# Patient Record
Sex: Female | Born: 1950
Health system: Southern US, Community
[De-identification: ages and names within clinical notes are randomized; demographics above are authoritative.]

## PROBLEM LIST (undated history)

## (undated) DIAGNOSIS — G2581 Restless legs syndrome: Secondary | ICD-10-CM

## (undated) DIAGNOSIS — M810 Age-related osteoporosis without current pathological fracture: Secondary | ICD-10-CM

## (undated) DIAGNOSIS — F419 Anxiety disorder, unspecified: Secondary | ICD-10-CM

## (undated) DIAGNOSIS — E785 Hyperlipidemia, unspecified: Secondary | ICD-10-CM

## (undated) DIAGNOSIS — M1990 Unspecified osteoarthritis, unspecified site: Secondary | ICD-10-CM

## (undated) DIAGNOSIS — J449 Chronic obstructive pulmonary disease, unspecified: Secondary | ICD-10-CM

## (undated) DIAGNOSIS — Z87442 Personal history of urinary calculi: Secondary | ICD-10-CM

## (undated) DIAGNOSIS — Z8709 Personal history of other diseases of the respiratory system: Secondary | ICD-10-CM

## (undated) DIAGNOSIS — Z720 Tobacco use: Secondary | ICD-10-CM

## (undated) DIAGNOSIS — T1490XA Injury, unspecified, initial encounter: Secondary | ICD-10-CM

## (undated) HISTORY — DX: Anxiety disorder, unspecified: F41.9

## (undated) HISTORY — PX: OTHER SURGICAL HISTORY: SHX169

## (undated) HISTORY — PX: COLONOSCOPY: SHX174

## (undated) HISTORY — DX: Age-related osteoporosis without current pathological fracture: M81.0

## (undated) HISTORY — PX: DILATION AND CURETTAGE OF UTERUS: SHX78

## (undated) HISTORY — PX: POLYPECTOMY: SHX149

## (undated) HISTORY — DX: Hyperlipidemia, unspecified: E78.5

---

## 2001-03-13 ENCOUNTER — Encounter: Admission: RE | Admit: 2001-03-13 | Discharge: 2001-03-13 | Payer: Self-pay | Admitting: Orthopedic Surgery

## 2001-03-13 ENCOUNTER — Encounter: Payer: Self-pay | Admitting: Orthopedic Surgery

## 2002-06-04 ENCOUNTER — Other Ambulatory Visit: Admission: RE | Admit: 2002-06-04 | Discharge: 2002-06-04 | Payer: Self-pay | Admitting: Obstetrics and Gynecology

## 2003-06-05 ENCOUNTER — Other Ambulatory Visit: Admission: RE | Admit: 2003-06-05 | Discharge: 2003-06-05 | Payer: Self-pay | Admitting: Obstetrics and Gynecology

## 2003-11-18 ENCOUNTER — Encounter: Payer: Self-pay | Admitting: Internal Medicine

## 2003-12-01 ENCOUNTER — Ambulatory Visit (HOSPITAL_COMMUNITY): Admission: RE | Admit: 2003-12-01 | Discharge: 2003-12-01 | Payer: Self-pay | Admitting: Orthopedic Surgery

## 2004-07-13 ENCOUNTER — Other Ambulatory Visit: Admission: RE | Admit: 2004-07-13 | Discharge: 2004-07-13 | Payer: Self-pay | Admitting: Obstetrics and Gynecology

## 2005-07-25 ENCOUNTER — Other Ambulatory Visit: Admission: RE | Admit: 2005-07-25 | Discharge: 2005-07-25 | Payer: Self-pay | Admitting: Obstetrics and Gynecology

## 2008-02-22 DIAGNOSIS — M810 Age-related osteoporosis without current pathological fracture: Secondary | ICD-10-CM

## 2008-02-22 HISTORY — DX: Age-related osteoporosis without current pathological fracture: M81.0

## 2009-10-07 ENCOUNTER — Encounter: Payer: Self-pay | Admitting: Internal Medicine

## 2009-12-29 ENCOUNTER — Encounter: Payer: Self-pay | Admitting: Internal Medicine

## 2009-12-30 ENCOUNTER — Encounter: Admission: RE | Admit: 2009-12-30 | Discharge: 2009-12-30 | Payer: Self-pay | Admitting: Family Medicine

## 2009-12-30 ENCOUNTER — Encounter (INDEPENDENT_AMBULATORY_CARE_PROVIDER_SITE_OTHER): Payer: Self-pay | Admitting: *Deleted

## 2009-12-31 ENCOUNTER — Encounter (INDEPENDENT_AMBULATORY_CARE_PROVIDER_SITE_OTHER): Payer: Self-pay | Admitting: *Deleted

## 2010-01-29 DIAGNOSIS — Z860100 Personal history of colon polyps, unspecified: Secondary | ICD-10-CM | POA: Insufficient documentation

## 2010-01-29 DIAGNOSIS — N951 Menopausal and female climacteric states: Secondary | ICD-10-CM | POA: Insufficient documentation

## 2010-01-29 DIAGNOSIS — F411 Generalized anxiety disorder: Secondary | ICD-10-CM | POA: Insufficient documentation

## 2010-02-01 ENCOUNTER — Ambulatory Visit: Payer: Self-pay | Admitting: Internal Medicine

## 2010-02-01 DIAGNOSIS — E785 Hyperlipidemia, unspecified: Secondary | ICD-10-CM | POA: Insufficient documentation

## 2010-02-01 DIAGNOSIS — R142 Eructation: Secondary | ICD-10-CM

## 2010-02-01 DIAGNOSIS — R141 Gas pain: Secondary | ICD-10-CM | POA: Insufficient documentation

## 2010-02-01 DIAGNOSIS — M129 Arthropathy, unspecified: Secondary | ICD-10-CM | POA: Insufficient documentation

## 2010-02-01 DIAGNOSIS — F329 Major depressive disorder, single episode, unspecified: Secondary | ICD-10-CM | POA: Insufficient documentation

## 2010-02-01 DIAGNOSIS — R109 Unspecified abdominal pain: Secondary | ICD-10-CM | POA: Insufficient documentation

## 2010-02-01 DIAGNOSIS — F32A Depression, unspecified: Secondary | ICD-10-CM | POA: Insufficient documentation

## 2010-02-01 DIAGNOSIS — R143 Flatulence: Secondary | ICD-10-CM

## 2010-02-02 LAB — CONVERTED CEMR LAB
Basophils Relative: 0.3 % (ref 0.0–3.0)
Eosinophils Absolute: 0 10*3/uL (ref 0.0–0.7)
Lymphocytes Relative: 50.4 % — ABNORMAL HIGH (ref 12.0–46.0)
MCHC: 33.7 g/dL (ref 30.0–36.0)
Neutrophils Relative %: 40.8 % — ABNORMAL LOW (ref 43.0–77.0)
Platelets: 260 10*3/uL (ref 150.0–400.0)
RBC: 4.23 M/uL (ref 3.87–5.11)
Sed Rate: 10 mm/hr (ref 0–22)
TSH: 2.26 microintl units/mL (ref 0.35–5.50)
WBC: 11 10*3/uL — ABNORMAL HIGH (ref 4.5–10.5)

## 2010-02-05 ENCOUNTER — Ambulatory Visit: Payer: Self-pay | Admitting: Internal Medicine

## 2010-02-06 ENCOUNTER — Telehealth: Payer: Self-pay | Admitting: Gastroenterology

## 2010-02-06 ENCOUNTER — Emergency Department (HOSPITAL_COMMUNITY)
Admission: EM | Admit: 2010-02-06 | Discharge: 2010-02-06 | Payer: Self-pay | Source: Home / Self Care | Admitting: Emergency Medicine

## 2010-02-08 ENCOUNTER — Ambulatory Visit (HOSPITAL_COMMUNITY)
Admission: RE | Admit: 2010-02-08 | Discharge: 2010-02-08 | Payer: Self-pay | Source: Home / Self Care | Attending: Urology | Admitting: Urology

## 2010-02-08 ENCOUNTER — Encounter: Payer: Self-pay | Admitting: Internal Medicine

## 2010-02-09 ENCOUNTER — Telehealth: Payer: Self-pay | Admitting: Internal Medicine

## 2010-03-25 NOTE — Procedures (Signed)
Summary: COLON   Colonoscopy  Procedure date:  11/18/2003  Findings:      Location:  [facility].    Procedures Next Due Date:    Colonoscopy: 11/2008 Patient Name: Erica Richardson, Boullion MRN:  Procedure Procedures: Colonoscopy CPT: 60454.    with polypectomy. CPT: A3573898.  Personnel: Endoscopist: Dora L. Juanda Chance, MD.  Referred By: Huel Cote, MD.  Exam Location: Exam performed in Outpatient Clinic. Outpatient  Patient Consent: Procedure, Alternatives, Risks and Benefits discussed, consent obtained, from patient. Consent was obtained by the RN.  Indications  Average Risk Screening Routine.  History  Current Medications: Patient is not currently taking Coumadin.  Pre-Exam Physical: Performed Nov 18, 2003. Entire physical exam was normal.  Exam Exam: Extent of exam reached: Cecum, extent intended: Cecum.  The cecum was identified by appendiceal orifice and IC valve. Colon retroflexion performed. Images taken. ASA Classification: I. Tolerance: good.  Monitoring: Pulse and BP monitoring, Oximetry used. Supplemental O2 given.  Colon Prep Used Miralax for colon prep. Prep results: good.  Sedation Meds: Patient assessed and found to be appropriate for moderate (conscious) sedation. Fentanyl 100 mcg. given IV. Versed 10 mg. given IV.  Findings NORMAL EXAM: Cecum.  - POLYP: Rectum, Maximum size: 6 mm. pedunculated polyp. Distance from Anus 2 cm. Procedure:  snare with cautery, removed, retrieved, Polyp sent to pathology. ICD9: Colon Polyps: 211.3.   Assessment Abnormal examination, see findings above.  Diagnoses: 211.3: Colon Polyps.   Comments: s/p snare polypectomy Events  Unplanned Interventions: No intervention was required.  Unplanned Events: There were no complications. Plans  Post Exam Instructions: No aspirin or non-steroidal containing medications: 2 weeks.  Medication Plan: Await pathology.  Patient Education: Patient given  standard instructions for: Yearly hemoccult testing recommended. Patient instructed to get routine colonoscopy every 3-5 years.  Comments: recall colon. in 3-5 years depending on whether the polyp is hyperplastic or adenomatous Disposition: After procedure patient sent to recovery. After recovery patient sent home.   This report was created from the original endoscopy report, which was reviewed and signed by the above listed endoscopist.    This report was created from the original endoscopy report, which was reviewed and signed by the above listed endoscopist.

## 2010-03-25 NOTE — Procedures (Signed)
Summary: Colonoscopy  Patient: Erica Richardson Note: All result statuses are Final unless otherwise noted.  Tests: (1) Colonoscopy (COL)   COL Colonoscopy           DONE     [facility]     [address]     [place], [place]  [address]           COLONOSCOPY PROCEDURE REPORT           PATIENT:  Erica Richardson, Erica Richardson  MR#:  [id number]     BIRTHDATE:  [birth year], 59 yrs. old  GENDER:  female     ENDOSCOPIST:  Hedwig Morton. Juanda Chance, MD     REF. BY:  Catha Gosselin, M.D.     PROCEDURE DATE:  02/05/2010     PROCEDURE:  Colonoscopy 69629     ASA CLASS:  Class II     INDICATIONS:  Abdominal pain lower abd. pain, constant, relieved     by laying down,     CT scan abd. negative     last colon 2005 hypeplastic polyp     MEDICATIONS:   Versed 10 mg, Fentanyl 100 mcg           DESCRIPTION OF PROCEDURE:   After the risks benefits and     alternatives of the procedure were thoroughly explained, informed     consent was obtained.  Digital rectal exam was performed and     revealed no rectal masses.   The LB 180AL [id number] endoscope was     introduced through the anus and advanced to the cecum, which was     identified by both the appendix and ileocecal valve, without     limitations.  The quality of the prep was good, using MiraLax.     The instrument was then slowly withdrawn as the colon was fully     examined.     <<PROCEDUREIMAGES>>           FINDINGS:  No polyps or cancers were seen (see image1, image2,     image3, image4, image5, and image6).   Retroflexed views in the     rectum revealed no abnormalities.    The scope was then withdrawn     from the patient and the procedure completed.           COMPLICATIONS:  None     ENDOSCOPIC IMPRESSION:     1) No polyps or cancers     2) Normal colonoscopy     nothing to account for lower abd. pain,, colon is not tortuos,     there was no difficulty in passing through,     pt had a hard time relaxing     RECOMMENDATIONS:     refer to Urology  center for evaluation of a pelvic floor     dysfunction and urinary frequency     Trial of Librax 1 po bid, #60, 1 refill as an antispasmodic     REPEAT EXAM:  In 10 year(s) for.           ______________________________     Hedwig Morton. Juanda Chance, MD           CC:  Huel Cote, MD           n.     Rosalie DoctorHedwig Morton. Brodie at 02/05/2010 11:43 AM           Lorene Dy, [phone]  Note: An exclamation mark (!) indicates a result that was  not dispersed into the flowsheet. Document Creation Date: 02/05/2010 11:44 AM _______________________________________________________________________  (1) Order result status: Final Collection or observation date-time: 02/05/2010 11:33 Requested date-time:  Receipt date-time:  Reported date-time:  Referring Physician:   Ordering Physician: Lina Sar [phone]) Specimen Source:  Source: Launa Grill Order Number: [id number] Lab site:   Appended Document: Colonoscopy    Clinical Lists Changes  Observations: Added new observation of COLONNXTDUE: 02/06/2020 (02/05/2010 13:36)

## 2010-03-25 NOTE — Letter (Signed)
Summary: [facility] Office Note  [facility] Office Note   Imported By: Lamona Curl CMA (AAMA) 02/09/2010 10:46:17  _____________________________________________________________________  External Attachment:    Type:   Image     Comment:   External Document

## 2010-03-25 NOTE — Progress Notes (Signed)
Summary: Urology Evaluation  ---- Converted from flag ----  ---- 02/08/2010 9:27 AM, Lamona Curl CMA (AAMA) wrote: ok...so do I need to set up appointment with urology now that they saw her in consult? or do you just want me to send our notes over? ---- 02/07/2010 9:53 AM, Hart Carwin MD wrote: Did I tell You to set up a urology appointment at the Urology center for lower abd. pain, possible pelvic floor dysfunction, urinary frequency, r/o interstitial cystitis? She ended up in ER this weekend with a kidney stone and Dr Christella Hartigan had a Urology to see her for that so she may have a urologist by now but he may not know the other stuff about her. ------------------------------ ---- 02/08/2010 9:27 AM, Lamona Curl CMA (AAMA) wrote: ok...so do I need to set up appointment with urology now that they saw her in consult? or do you just want me to send our notes over?  ---- 02/08/2010 9:44 PM, Hart Carwin MD wrote: I think she needs a separate referral to Urology clinic for pelvic pain and urinary frequency.   I have called [facility]. She actually saw them in the office yesterday at which time she did complain to them of suprapubic pain and urgency. I will send note to Dr Juanda Chance. Dottie Nelson-Smith CMA Duncan Dull)  February 09, 2010 10:42 AM    Appended Document: Urology Evaluation I did fax our office notes to Dr Annabell Howells for his review.

## 2010-03-25 NOTE — Letter (Signed)
Summary: [facility]   Imported By: Sherian Rein 02/08/2010 10:13:28  _____________________________________________________________________  External Attachment:    Type:   Image     Comment:   External Document

## 2010-03-25 NOTE — Letter (Signed)
Summary: Pt's Medical & Medication Hx/[facility]  Pt's Medical & Medication Hx/[facility]   Imported By: Sherian Rein 02/08/2010 10:14:50  _____________________________________________________________________  External Attachment:    Type:   Image     Comment:   External Document

## 2010-03-25 NOTE — Miscellaneous (Signed)
Summary: librax rx  Clinical Lists Changes  Medications: Added new medication of LIBRAX 2.5-5 MG  CAPS (CLIDINIUM-CHLORDIAZEPOXIDE) take one by mouth twice daily - Signed Rx of LIBRAX 2.5-5 MG  CAPS (CLIDINIUM-CHLORDIAZEPOXIDE) take one by mouth twice daily;  #60 x 1;  Signed;  Entered by: Oda Cogan RN;  Authorized by: Hart Carwin MD;  Method used: Electronically to [facility] [phone]*, [address] [place], [place]  [address], Ph: [phone] or [phone], Fax: [phone]    Prescriptions: LIBRAX 2.5-5 MG  CAPS (CLIDINIUM-CHLORDIAZEPOXIDE) take one by mouth twice daily  #60 x 1   Entered by:   Oda Cogan RN   Authorized by:   Hart Carwin MD   Signed by:   Oda Cogan RN on 02/05/2010   Method used:   [facility]ronically to        [facility] [phone][facility] (retail)       [address]       [place], [place]  [address]       Ph: [phone] or [phone]       Fax: [phone]   RxID:   [id number]

## 2010-03-25 NOTE — Letter (Signed)
Summary: New Patient letter  [facility]  [address] [place], [place] [address]   Phone: [phone]  Fax: [phone]       12/31/2009 MRN: [id number]  [facility] [address] [place], [place]  [address]  Dear Erica Richardson,  Welcome to the Gastroenterology Division at [facility].    You are scheduled to see Dr.  Juanda Chance on 03-08-09 at 10:30a.m. on the 3rd floor at [facility], [address]  We ask that you try to arrive at our office 15 minutes prior to your appointment time to allow for check-in.  We would like you to complete the enclosed self-administered evaluation form prior to your visit and bring it with you on the day of your appointment.  We will review it with you.  Also, please bring a complete list of all your medications or, if you prefer, bring the medication bottles and we will list them.  Please bring your insurance card so that we may make a copy of it.  If your insurance requires a referral to see a specialist, please bring your referral form from your primary care physician.  Co-payments are due at the time of your visit and may be paid by cash, check or credit card.     Your office visit will consist of a consult with your physician (includes a physical exam), any laboratory testing he/she may order, scheduling of any necessary diagnostic testing (e.g. x-ray, ultrasound, CT-scan), and scheduling of a procedure (e.g. Endoscopy, Colonoscopy) if required.  Please allow enough time on your schedule to allow for any/all of these possibilities.    If you cannot keep your appointment, please call [phone] to cancel or reschedule prior to your appointment date.  This allows [name] the opportunity to schedule an appointment for another patient in need of care.  If you do not cancel or reschedule by 5 p.m. the business day prior to your appointment date, you will be charged a $50.00 late cancellation/no-show fee.    Thank you for choosing  [facility] for your medical needs.  We appreciate the opportunity to care for you.  Please visit [name] at our website  to learn more about our practice.                     Sincerely,                                                             The Gastroenterology Division

## 2010-03-25 NOTE — Letter (Signed)
Summary: [facility] Instructions  [facility]  [address] [place], [place] [address]   Phone: [phone]  Fax: [phone]       Erica Richardson    [birth year]    MRN: [id number]       Procedure Day /Date: Tuesday 03/16/10     Arrival Time: 7:30 am     Procedure Time: 8:00 am     Location of Procedure:                    _x _  [facility] (4th Floor)  PREPARATION FOR COLONOSCOPY WITH MIRALAX  Starting 5 days prior to your procedure 03/11/10 do not eat nuts, seeds, popcorn, corn, beans, peas,  salads, or any raw vegetables.  Do not take any fiber supplements (e.g. Metamucil, Citrucel, and Benefiber). ____________________________________________________________________________________________________   THE DAY BEFORE YOUR PROCEDURE         DATE: 03/15/10 DAY: Monday  1   Drink clear liquids the entire day-NO SOLID FOOD  2   Do not drink anything colored red or purple.  Avoid juices with pulp.  No orange juice.  3   Drink at least 64 oz. (8 glasses) of fluid/clear liquids during the day to prevent dehydration and help the prep work efficiently.  CLEAR LIQUIDS INCLUDE: Water Jello Ice Popsicles Tea (sugar ok, no milk/cream) Powdered fruit flavored drinks Coffee (sugar ok, no milk/cream) Gatorade Juice: apple, white grape, white cranberry  Lemonade Clear bullion, consomm, broth Carbonated beverages (any kind) Strained chicken noodle soup Hard Candy  4   Mix the entire bottle of Miralax with 64 oz. of Gatorade/Powerade in the morning and put in the refrigerator to chill.  5   At 3:00 pm take 2 Dulcolax/Bisacodyl tablets.  6   At 4:30 pm take one Reglan/Metoclopramide tablet.  7  Starting at 5:00 pm drink one 8 oz glass of the Miralax mixture every 15-20 minutes until you have finished drinking the entire 64 oz.  You should finish drinking prep around 7:30 or 8:00 pm.  8   If you are nauseated, you may take the 2nd Reglan/Metoclopramide tablet  at 6:30 pm.        9    At 8:00 pm take 2 more DULCOLAX/Bisacodyl tablets.         THE DAY OF YOUR PROCEDURE      DATE:  03/16/10 DAY: Tuesday  You may drink clear liquids until 6:00 am  (2 HOURS BEFORE PROCEDURE).   MEDICATION INSTRUCTIONS  Unless otherwise instructed, you should take regular prescription medications with a small sip of water as early as possible the morning of your procedure.        OTHER INSTRUCTIONS  You will need a responsible adult at least 58 years of age to accompany you and drive you home.   This person must remain in the waiting room during your procedure.  Wear loose fitting clothing that is easily removed.  Leave jewelry and other valuables at home.  However, you may wish to bring a book to read or an iPod/MP3 player to listen to music as you wait for your procedure to start.  Remove all body piercing jewelry and leave at home.  Total time from sign-in until discharge is approximately 2-3 hours.  You should go home directly after your procedure and rest.  You can resume normal activities the day after your procedure.  The day of your procedure you should not:   Drive  Make legal decisions   Operate machinery   Drink alcohol   Return to work  You will receive specific instructions about eating, activities and medications before you leave.   The above instructions have been reviewed and explained to me by   Lamona Curl CMA Duncan Dull)  February 01, 2010 8:53 AM    I fully understand and can verbalize these instructions _____________________________ Date _______

## 2010-03-25 NOTE — Progress Notes (Signed)
  Phone Note Call from Patient   Summary of Call: she called early this am with acute llq pain.  I asked her to go to ER.  Evaluation there showed left sided  hydroureter to the level of a 7mm stone.  ER triaged her to urologic care. Initial call taken by: Rachael Fee MD,  February 06, 2010 10:49 AM
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
## 2010-05-03 LAB — BASIC METABOLIC PANEL
Calcium: 9 mg/dL (ref 8.4–10.5)
Chloride: 107 mEq/L (ref 96–112)
Creatinine, Ser: 1.25 mg/dL — ABNORMAL HIGH (ref 0.4–1.2)
GFR calc Af Amer: 53 mL/min — ABNORMAL LOW (ref >60)
GFR calc non Af Amer: 44 mL/min — ABNORMAL LOW (ref >60)

## 2010-05-03 LAB — DIFFERENTIAL
Basophils Absolute: 0 10*3/uL (ref 0.0–0.1)
Lymphocytes Relative: 22 % (ref 12–46)
Lymphs Abs: 2.4 10*3/uL (ref 0.7–4.0)
Neutro Abs: 6.3 10*3/uL (ref 1.7–7.7)
Neutrophils Relative %: 59 % (ref 43–77)

## 2010-05-03 LAB — CBC
MCV: 97.3 fL (ref 78.0–100.0)
Platelets: 295 10*3/uL (ref 150–400)
RBC: 4.03 MIL/uL (ref 3.87–5.11)
RDW: 13.1 % (ref 11.5–15.5)
WBC: 10.7 10*3/uL — ABNORMAL HIGH (ref 4.0–10.5)

## 2010-05-03 LAB — URINALYSIS, ROUTINE W REFLEX MICROSCOPIC
Bilirubin Urine: NEGATIVE
Ketones, ur: NEGATIVE mg/dL
Nitrite: NEGATIVE
Protein, ur: NEGATIVE mg/dL
Specific Gravity, Urine: 1.017 (ref 1.005–1.030)
Urobilinogen, UA: 0.2 mg/dL (ref 0.0–1.0)

## 2010-05-03 LAB — SURGICAL PCR SCREEN
MRSA, PCR: NEGATIVE
Staphylococcus aureus: NEGATIVE

## 2011-11-24 ENCOUNTER — Ambulatory Visit
Admission: RE | Admit: 2011-11-24 | Discharge: 2011-11-24 | Disposition: A | Discharge status: Home / Self Care | Payer: BC Managed Care – PPO | Source: Ambulatory Visit | Attending: Family Medicine | Admitting: Family Medicine

## 2011-11-24 ENCOUNTER — Other Ambulatory Visit: Payer: Self-pay | Admitting: Family Medicine

## 2011-11-24 DIAGNOSIS — R109 Unspecified abdominal pain: Secondary | ICD-10-CM

## 2011-11-24 MED ORDER — IOHEXOL 300 MG/ML  SOLN
10.0000 mL | Freq: Once | INTRAMUSCULAR | Status: AC | PRN
  Administered 2011-11-24: 10 mL via ORAL

## 2011-11-24 MED ORDER — IOHEXOL 300 MG/ML  SOLN
100.0000 mL | Freq: Once | INTRAMUSCULAR | Status: AC | PRN
  Administered 2011-11-24: 100 mL via INTRAVENOUS

## 2012-08-07 ENCOUNTER — Other Ambulatory Visit: Payer: Self-pay | Admitting: Family Medicine

## 2012-08-07 DIAGNOSIS — Z87891 Personal history of nicotine dependence: Secondary | ICD-10-CM

## 2013-08-14 ENCOUNTER — Other Ambulatory Visit: Payer: Self-pay | Admitting: Family Medicine

## 2013-08-14 DIAGNOSIS — Z720 Tobacco use: Secondary | ICD-10-CM

## 2013-08-19 ENCOUNTER — Other Ambulatory Visit: Payer: BC Managed Care – PPO

## 2013-08-30 ENCOUNTER — Other Ambulatory Visit: Payer: BC Managed Care – PPO

## 2015-01-26 ENCOUNTER — Encounter: Payer: Self-pay | Admitting: Internal Medicine

## 2015-05-01 ENCOUNTER — Ambulatory Visit (INDEPENDENT_AMBULATORY_CARE_PROVIDER_SITE_OTHER): Payer: 59 | Admitting: Family Medicine

## 2015-05-01 VITALS — BP 118/80 | HR 81 | Temp 97.5°F | Resp 16 | Ht 63.0 in | Wt 157.0 lb

## 2015-05-01 DIAGNOSIS — B079 Viral wart, unspecified: Secondary | ICD-10-CM

## 2015-05-01 DIAGNOSIS — J029 Acute pharyngitis, unspecified: Secondary | ICD-10-CM | POA: Diagnosis not present

## 2015-05-01 DIAGNOSIS — H9201 Otalgia, right ear: Secondary | ICD-10-CM | POA: Diagnosis not present

## 2015-05-01 LAB — POCT CBC
GRANULOCYTE PERCENT: 39.1 % (ref 37–80)
HCT, POC: 40.3 % (ref 37.7–47.9)
Hemoglobin: 14.3 g/dL (ref 12.2–16.2)
Lymph, poc: 3.6 — AB (ref 0.6–3.4)
MCH: 33.4 pg — AB (ref 27–31.2)
MCHC: 35.6 g/dL — AB (ref 31.8–35.4)
MCV: 93.8 fL (ref 80–97)
MID (CBC): 0.7 (ref 0–0.9)
MPV: 7.1 fL (ref 0–99.8)
PLATELET COUNT, POC: 224 10*3/uL (ref 142–424)
POC Granulocyte: 2.8 (ref 2–6.9)
POC LYMPH %: 50.6 % — AB (ref 10–50)
POC MID %: 10.3 % (ref 0–12)
RBC: 4.3 M/uL (ref 4.04–5.48)
RDW, POC: 13.2 %
WBC: 7.2 10*3/uL (ref 4.6–10.2)

## 2015-05-01 LAB — POCT RAPID STREP A (OFFICE): Rapid Strep A Screen: NEGATIVE

## 2015-05-01 MED ORDER — AMOXICILLIN 500 MG PO TABS: 1000.0000 mg | ORAL_TABLET | Freq: Two times a day (BID) | ORAL | Status: DC

## 2015-05-01 NOTE — Progress Notes (Signed)
Subjective:    Patient ID: Erica Richardson, female    DOB: April 26, 1950, 65 y.o.   MRN: [id number]  05/01/2015  Ear Pain and Facial Pain   HPI This 65 y.o. female presents for evaluation of R facial pain and R ear pain. Onset four days ago.  No fever/chills/sweats; +mild body aches.  Started in R mandible region; thought was from tooth origin.  Felt swollen initially.  +pain with eating; floshing and brushing causes pain.  Pain radiates to the R ear; now pain mostly in ear.  Pain also radiates into R scalp.  No rash.  No glands swollen or tender.  Pain with swallowing along side; no pani with talking.  No pain with opening mouth.  Hard to eat and swallow.  Eating on opposite side of mouth.  Real pain is the shooting pain in ear.  Taking Tylenol and Gabapentin since three days ago.    Review of Systems  Constitutional: Negative for fever, chills, diaphoresis and fatigue.  Eyes: Negative for visual disturbance.  Respiratory: Negative for cough and shortness of breath.   Cardiovascular: Negative for chest pain, palpitations and leg swelling.  Gastrointestinal: Negative for nausea, vomiting, abdominal pain, diarrhea and constipation.  Endocrine: Negative for cold intolerance, heat intolerance, polydipsia, polyphagia and polyuria.  Neurological: Negative for dizziness, tremors, seizures, syncope, facial asymmetry, speech difficulty, weakness, light-headedness, numbness and headaches.    Past Medical History  Diagnosis Date  . Osteoporosis    History reviewed. No pertinent past surgical history. Allergies  Allergen Reactions  . Codeine   . Erythromycin     Social History   Social History  . Marital Status: Married    Spouse Name: N/A  . Number of Children: N/A  . Years of Education: N/A   Occupational History  . Not on file.   Social History Main Topics  . Smoking status: Current Every Day Smoker  . Smokeless tobacco: Never Used  . Alcohol Use: No  . Drug Use: No  . Sexual  Activity: Not on file   Other Topics Concern  . Not on file   Social History Narrative  . No narrative on file   Family History  Problem Relation Age of Onset  . Diabetes Mother   . Heart disease Mother        Objective:    BP 118/80 mmHg  Pulse 81  Temp(Src) 97.5 F (36.4 C) (Oral)  Resp 16  Ht 5\' 3"  (1.6 m)  Wt 157 lb (71.215 kg)  BMI 27.82 kg/m2  SpO2 96% Physical Exam  Constitutional: She is oriented to person, place, and time. She appears well-developed and well-nourished. No distress.  HENT:  Head: Normocephalic and atraumatic.  Right Ear: External ear normal.  Left Ear: External ear normal.  Nose: Nose normal.  Mouth/Throat: Oropharynx is clear and moist.  Eyes: Conjunctivae and EOM are normal. Pupils are equal, round, and reactive to light.  Neck: Normal range of motion. Neck supple. Carotid bruit is not present. No thyromegaly present.  Cardiovascular: Normal rate, regular rhythm, normal heart sounds and intact distal pulses.  Exam reveals no gallop and no friction rub.   No murmur heard. Pulmonary/Chest: Effort normal and breath sounds normal. She has no wheezes. She has no rales.  Abdominal: Soft. Bowel sounds are normal. She exhibits no distension and no mass. There is no tenderness. There is no rebound and no guarding.  Lymphadenopathy:    She has no cervical adenopathy.  Neurological: She is alert  and oriented to person, place, and time. No cranial nerve deficit.  Skin: Skin is warm and dry. No rash noted. She is not diaphoretic. No erythema. No pallor.  Psychiatric: She has a normal mood and affect. Her behavior is normal.        Assessment & Plan:   1. Sore throat   2. Right ear pain   3. Wart     Orders Placed This Encounter  Procedures  . Culture, Group A Strep    Order Specific Question:  Source    Answer:  throat  . POCT CBC  . POCT rapid strep A   Meds ordered this encounter  Medications  . atorvastatin (LIPITOR) 10 MG tablet     Sig: Take 10 mg by mouth daily.  . pramipexole (MIRAPEX) 0.125 MG tablet    Sig: Take 0.125 mg by mouth 3 (three) times daily.  Marland Kitchen gabapentin (NEURONTIN) 800 MG tablet    Sig: Take 800 mg by mouth 3 (three) times daily.  . citalopram (CELEXA) 40 MG tablet    Sig: Take 40 mg by mouth daily.  Marland Kitchen aspirin 81 MG tablet    Sig: Take 81 mg by mouth daily.  . Calcium-Magnesium-Vitamin D (CALCIUM 500 PO)    Sig: Take by mouth.  . hydrochlorothiazide (HYDRODIURIL) 25 MG tablet    Sig: Take 25 mg by mouth daily.  Marland Kitchen acetaminophen (TYLENOL) 500 MG tablet    Sig: Take 500 mg by mouth every 6 (six) hours as needed.  . traZODone (DESYREL) 100 MG tablet    Sig: Take 100 mg by mouth at bedtime.  Marland Kitchen amoxicillin (AMOXIL) 500 MG tablet    Sig: Take 2 tablets (1,000 mg total) by mouth 2 (two) times daily.    Dispense:  40 tablet    Refill:  0    No Follow-up on file.    Shaylee Stanislawski Elayne Guerin, M.D. [facility] [address] [place], [place]  [address] [phone] phone [phone] fax

## 2015-05-01 NOTE — Patient Instructions (Addendum)
IF you received an x-ray today, you will receive an invoice from [facility]. Please contact [facility] at [phone] with questions or concerns regarding your invoice.   IF you received labwork today, you will receive an invoice from [employer]. Please contact [facility] at [phone] with questions or concerns regarding your invoice.   Our billing staff will not be able to assist you with questions regarding bills from these companies.  You will be contacted with the lab results as soon as they are available. The fastest way to get your results is to activate your My Chart account. Instructions are located on the last page of this paperwork. If you have not heard from [name] regarding the results in 2 weeks, please contact this office.   Pharyngitis Pharyngitis is redness, pain, and swelling (inflammation) of your pharynx.  CAUSES  Pharyngitis is usually caused by infection. Most of the time, these infections are from viruses (viral) and are part of a cold. However, sometimes pharyngitis is caused by bacteria (bacterial). Pharyngitis can also be caused by allergies. Viral pharyngitis may be spread from person to person by coughing, sneezing, and personal items or utensils (cups, forks, spoons, toothbrushes). Bacterial pharyngitis may be spread from person to person by more intimate contact, such as kissing.  SIGNS AND SYMPTOMS  Symptoms of pharyngitis include:   Sore throat.   Tiredness (fatigue).   Low-grade fever.   Headache.  Joint pain and muscle aches.  Skin rashes.  Swollen lymph nodes.  Plaque-like film on throat or tonsils (often seen with bacterial pharyngitis). DIAGNOSIS  Your health care provider will ask you questions about your illness and your symptoms. Your medical history, along with a physical exam, is often all that is needed to diagnose pharyngitis. Sometimes, a rapid strep test is done. Other lab tests may also be  done, depending on the suspected cause.  TREATMENT  Viral pharyngitis will usually get better in 3-4 days without the use of medicine. Bacterial pharyngitis is treated with medicines that kill germs (antibiotics).  HOME CARE INSTRUCTIONS   Drink enough water and fluids to keep your urine clear or pale yellow.   Only take over-the-counter or prescription medicines as directed by your health care provider:   If you are prescribed antibiotics, make sure you finish them even if you start to feel better.   Do not take aspirin.   Get lots of rest.   Gargle with 8 oz of salt water ( tsp of salt per 1 qt of water) as often as every 1-2 hours to soothe your throat.   Throat lozenges (if you are not at risk for choking) or sprays may be used to soothe your throat. SEEK MEDICAL CARE IF:   You have large, tender lumps in your neck.  You have a rash.  You cough up green, yellow-brown, or bloody spit. SEEK IMMEDIATE MEDICAL CARE IF:   Your neck becomes stiff.  You drool or are unable to swallow liquids.  You vomit or are unable to keep medicines or liquids down.  You have severe pain that does not go away with the use of recommended medicines.  You have trouble breathing (not caused by a stuffy nose). MAKE SURE YOU:   Understand these instructions.  Will watch your condition.  Will get help right away if you are not doing well or get worse.   This information is not intended to replace advice given to you by your health care provider. Make sure you  discuss any questions you have with your health care provider.   Document Released: 02/07/2005 Document Revised: 11/28/2012 Document Reviewed: 10/15/2012 Elsevier Interactive Patient Education [employer].

## 2015-05-03 LAB — CULTURE, GROUP A STREP: Organism ID, Bacteria: NORMAL

## 2015-12-01 ENCOUNTER — Ambulatory Visit (INDEPENDENT_AMBULATORY_CARE_PROVIDER_SITE_OTHER): Payer: 59 | Admitting: Physical Medicine and Rehabilitation

## 2015-12-01 DIAGNOSIS — M1611 Unilateral primary osteoarthritis, right hip: Secondary | ICD-10-CM

## 2015-12-17 ENCOUNTER — Other Ambulatory Visit (INDEPENDENT_AMBULATORY_CARE_PROVIDER_SITE_OTHER): Payer: Self-pay | Admitting: Physical Medicine and Rehabilitation

## 2016-01-05 ENCOUNTER — Telehealth (INDEPENDENT_AMBULATORY_CARE_PROVIDER_SITE_OTHER): Payer: Self-pay | Admitting: Physical Medicine and Rehabilitation

## 2016-01-05 NOTE — Telephone Encounter (Signed)
I'm sure I likely said Ninfa Linden, tell we can get appointment for eval

## 2016-01-05 NOTE — Telephone Encounter (Signed)
I called the patient and scheduled her for an office visit with Dr. Ninfa Linden.

## 2016-01-07 ENCOUNTER — Ambulatory Visit (INDEPENDENT_AMBULATORY_CARE_PROVIDER_SITE_OTHER): Payer: 59 | Admitting: Orthopaedic Surgery

## 2016-01-07 DIAGNOSIS — M1611 Unilateral primary osteoarthritis, right hip: Secondary | ICD-10-CM

## 2016-01-07 DIAGNOSIS — M25551 Pain in right hip: Secondary | ICD-10-CM

## 2016-01-07 NOTE — Progress Notes (Signed)
Office Visit Note   Patient: Erica Richardson           Date of Birth: 1950-10-12           MRN: [id number] Visit Date: 01/07/2016              Requested by: Hulan Fess, MD [place], [place] [address] PCP: Gennette Pac, MD   Assessment & Plan: Visit Diagnoses:  1. Pain of right hip joint   2. Unilateral primary osteoarthritis, right hip     Plan: Given her daily pain and the failure conservative treatment as well as the negative impact her right hip has had on her activities daily living, her mobility, and her quality of life she wished to proceed with a total hip replacement. I showed her hip model and explained in great detail what anterior replacement involves as well as a thorough discussion of the risks and benefits of the surgery. Her pain is 10 out of 10 at times and again she's tried and failed all forms conservative treatment. She is definitely interested in having the surgery. We will work on getting this scheduled for her and we would see her back in 2 weeks postoperative. I did talk to her in detail about what her intraoperative and postoperative course with involved as well.  Follow-Up Instructions: Return for 2 weeks post-op.   Orders:  No orders of the defined types were placed in this encounter.  No orders of the defined types were placed in this encounter.     Procedures: No procedures performed   Clinical Data: No additional findings.   Subjective: Chief Complaint  Patient presents with  . Right Hip - Pain    Patient had last hip injection with Dr. Ernestina Patches 12/01/15. She states that the injection did not last long. She states she is tired of taking medicine and taking injections. She wants the hip "fixed".    Her pain is in the groin. Is been going on for over a year now. She has had several injections in her hip and is frustrating to her this point. The pain can sometimes be 10 out of 10. HPI  Review of Systems Negative for  chest pain, headache, shortness of breath, fever, chills, nausea, vomiting.  Objective: Vital Signs: There were no vitals taken for this visit.  Physical Exam She is alert and oriented 3 in no acute distress Ortho Exam Examination of her right hip shows painful internal and external rotation and this is in the groin with a fluid range of motion and no leg length discrepancy Specialty Comments:  No specialty comments available.  Imaging: No results found. An MRI is reviewed of her right hip that shows anterior superior acetabular sclerotic and cystic changes as well as significant chondromalacia. Plain films show the hip is well located and has a concentric joint space.  PMFS History: Patient Active Problem List   Diagnosis Date Noted  . HYPERLIPIDEMIA 02/01/2010  . DEPRESSION 02/01/2010  . ARTHRITIS 02/01/2010  . ABDOMINAL BLOATING 02/01/2010  . ABDOMINAL PAIN, UNSPECIFIED SITE 02/01/2010  . ANXIETY 01/29/2010  . MENOPAUSAL SYNDROME 01/29/2010  . COLONIC POLYPS, HYPERPLASTIC, HX OF 01/29/2010   Past Medical History:  Diagnosis Date  . Osteoporosis     Family History  Problem Relation Age of Onset  . Diabetes Mother   . Heart disease Mother     No past surgical history on file. Social History   Occupational History  . Not on file.  Social History Main Topics  . Smoking status: Current Every Day Smoker  . Smokeless tobacco: Never Used  . Alcohol use No  . Drug use: No  . Sexual activity: Not on file

## 2016-01-08 ENCOUNTER — Other Ambulatory Visit (INDEPENDENT_AMBULATORY_CARE_PROVIDER_SITE_OTHER): Payer: Self-pay

## 2016-01-18 ENCOUNTER — Encounter (HOSPITAL_COMMUNITY): Payer: Self-pay | Admitting: *Deleted

## 2016-01-18 NOTE — Progress Notes (Signed)
Surgery on 12/8.  Proep on 11/28 at 100pm.  Need ordersin EPIC.  Thank You.

## 2016-01-18 NOTE — Progress Notes (Signed)
Need orders in EPIC.  surgeryon 01/29/16.  Thank You

## 2016-01-19 ENCOUNTER — Encounter (HOSPITAL_COMMUNITY)
Admission: RE | Admit: 2016-01-19 | Discharge: 2016-01-19 | Disposition: A | Discharge status: Home / Self Care | Payer: 59 | Source: Ambulatory Visit | Attending: Orthopaedic Surgery | Admitting: Orthopaedic Surgery

## 2016-01-19 ENCOUNTER — Other Ambulatory Visit (INDEPENDENT_AMBULATORY_CARE_PROVIDER_SITE_OTHER): Payer: Self-pay | Admitting: Physician Assistant

## 2016-01-19 ENCOUNTER — Encounter (HOSPITAL_COMMUNITY): Payer: Self-pay

## 2016-01-19 DIAGNOSIS — Z01818 Encounter for other preprocedural examination: Secondary | ICD-10-CM | POA: Diagnosis not present

## 2016-01-19 HISTORY — DX: Unspecified osteoarthritis, unspecified site: M19.90

## 2016-01-19 HISTORY — DX: Restless legs syndrome: G25.81

## 2016-01-19 HISTORY — DX: Injury, unspecified, initial encounter: T14.90XA

## 2016-01-19 HISTORY — DX: Tobacco use: Z72.0

## 2016-01-19 HISTORY — DX: Chronic obstructive pulmonary disease, unspecified: J44.9

## 2016-01-19 HISTORY — DX: Personal history of urinary calculi: Z87.442

## 2016-01-19 HISTORY — DX: Personal history of other diseases of the respiratory system: Z87.09

## 2016-01-19 LAB — CBC
HEMATOCRIT: 41.5 % (ref 36.0–46.0)
HEMOGLOBIN: 13.6 g/dL (ref 12.0–15.0)
MCH: 32 pg (ref 26.0–34.0)
MCHC: 32.8 g/dL (ref 30.0–36.0)
MCV: 97.6 fL (ref 78.0–100.0)
Platelets: 267 10*3/uL (ref 150–400)
RBC: 4.25 MIL/uL (ref 3.87–5.11)
RDW: 13.2 % (ref 11.5–15.5)
WBC: 7.9 10*3/uL (ref 4.0–10.5)

## 2016-01-19 LAB — BASIC METABOLIC PANEL
ANION GAP: 6 (ref 5–15)
BUN: 14 mg/dL (ref 6–20)
CO2: 28 mmol/L (ref 22–32)
Calcium: 9.4 mg/dL (ref 8.9–10.3)
Chloride: 105 mmol/L (ref 101–111)
Creatinine, Ser: 0.66 mg/dL (ref 0.44–1.00)
GFR calc Af Amer: >60 mL/min (ref >60)
GLUCOSE: 100 mg/dL — AB (ref 65–99)
POTASSIUM: 4.4 mmol/L (ref 3.5–5.1)
Sodium: 139 mmol/L (ref 135–145)

## 2016-01-19 LAB — PROTIME-INR
INR: 1.07
Prothrombin Time: 14 seconds (ref 11.4–15.2)

## 2016-01-19 LAB — SURGICAL PCR SCREEN
MRSA, PCR: NEGATIVE
STAPHYLOCOCCUS AUREUS: NEGATIVE

## 2016-01-19 LAB — ABO/RH: ABO/RH(D): A NEG

## 2016-01-19 NOTE — Patient Instructions (Signed)
Erica Richardson  01/19/2016   Your procedure is scheduled on: Friday January 29, 2016  Report to [facility] Main  Entrance take [place]  elevators to 3rd floor to  Erica Richardson at 7:30 AM.  Call this number if you have problems the morning of surgery [phone]   Remember: ONLY 1 PERSON MAY GO WITH YOU TO SHORT STAY TO GET  READY MORNING OF Erica Richardson.  Do not eat food or drink liquids :After Midnight.     Take these medicines the morning of surgery:  Combivent Inhaler if needed; Diazepam (Valium) if needed; Flonase if needed;Pramipizole (Mirapex)                                You may not have any metal on your body including hair pins and              piercings  Do not wear jewelry, make-up, lotions, powders or perfumes, deodorant             Do not wear nail polish.  Do not shave  48 hours prior to surgery.     Do not bring valuables to the hospital. Erica Richardson.  Contacts, dentures or bridgework may not be worn into surgery.  Leave suitcase in the car. After surgery it may be brought to your room.                  Please read over the following fact sheets you were given:MRSA INFORMATION SHEET; INCENTIVE SPIROMETER; BLOOD TRANSFUSION INFORMATION SHEET  _____________________________________________________________________             [facility] - Preparing for Surgery Before surgery, you can play an important role.  Because skin is not sterile, your skin needs to be as free of germs as possible.  You can reduce the number of germs on your skin by washing with CHG (chlorahexidine gluconate) soap before surgery.  CHG is an antiseptic cleaner which kills germs and bonds with the skin to continue killing germs even after washing. Please DO NOT use if you have an allergy to CHG or antibacterial soaps.  If your skin becomes reddened/irritated stop using the CHG and inform your nurse when you arrive at  Short Stay. Do not shave (including legs and underarms) for at least 48 hours prior to the first CHG shower.  You may shave your face/neck. Please follow these instructions carefully:  1.  Shower with CHG Soap the night before surgery and the  morning of Surgery.  2.  If you choose to wash your hair, wash your hair first as usual with your  normal  shampoo.  3.  After you shampoo, rinse your hair and body thoroughly to remove the  shampoo.                           4.  Use CHG as you would any other liquid soap.  You can apply chg directly  to the skin and wash                       Gently with a scrungie or clean washcloth.  5.  Apply the  CHG Soap to your body ONLY FROM THE NECK DOWN.   Do not use on face/ open                           Wound or open sores. Avoid contact with eyes, ears mouth and genitals (private parts).                       Wash face,  Genitals (private parts) with your normal soap.             6.  Wash thoroughly, paying special attention to the area where your surgery  will be performed.  7.  Thoroughly rinse your body with warm water from the neck down.  8.  DO NOT shower/wash with your normal soap after using and rinsing off  the CHG Soap.                9.  Pat yourself dry with a clean towel.            10.  Wear clean pajamas.            11.  Place clean sheets on your bed the night of your first shower and do not  sleep with pets. Day of Surgery : Do not apply any lotions/deodorants the morning of surgery.  Please wear clean clothes to the hospital/surgery center.  FAILURE TO FOLLOW THESE INSTRUCTIONS MAY RESULT IN THE CANCELLATION OF YOUR SURGERY PATIENT SIGNATURE_________________________________  NURSE SIGNATURE__________________________________  ________________________________________________________________________   Erica Richardson  An incentive spirometer is a tool that can help keep your lungs clear and active. This tool measures how well you are  filling your lungs with each breath. Taking long deep breaths may help reverse or decrease the chance of developing breathing (pulmonary) problems (especially infection) following:  A long period of time when you are unable to move or be active. BEFORE THE PROCEDURE   If the spirometer includes an indicator to show your best effort, your nurse or respiratory therapist will set it to a desired goal.  If possible, sit up straight or lean slightly forward. Try not to slouch.  Hold the incentive spirometer in an upright position. INSTRUCTIONS FOR USE  1. Sit on the edge of your bed if possible, or sit up as far as you can in bed or on a chair. 2. Hold the incentive spirometer in an upright position. 3. Breathe out normally. 4. Place the mouthpiece in your mouth and seal your lips tightly around it. 5. Breathe in slowly and as deeply as possible, raising the piston or the ball toward the top of the column. 6. Hold your breath for 3-5 seconds or for as long as possible. Allow the piston or ball to fall to the bottom of the column. 7. Remove the mouthpiece from your mouth and breathe out normally. 8. Rest for a few seconds and repeat Steps 1 through 7 at least 10 times every 1-2 hours when you are awake. Take your time and take a few normal breaths between deep breaths. 9. The spirometer may include an indicator to show your best effort. Use the indicator as a goal to work toward during each repetition. 10. After each set of 10 deep breaths, practice coughing to be sure your lungs are clear. If you have an incision (the cut made at the time of surgery), support your incision when coughing by placing a pillow or rolled up  towels firmly against it. Once you are able to get out of bed, walk around indoors and cough well. You may stop using the incentive spirometer when instructed by your caregiver.  RISKS AND COMPLICATIONS  Take your time so you do not get dizzy or light-headed.  If you are in pain,  you may need to take or ask for pain medication before doing incentive spirometry. It is harder to take a deep breath if you are having pain. AFTER USE  Rest and breathe slowly and easily.  It can be helpful to keep track of a log of your progress. Your caregiver can provide you with a simple table to help with this. If you are using the spirometer at home, follow these instructions: [place] IF:   You are having difficultly using the spirometer.  You have trouble using the spirometer as often as instructed.  Your pain medication is not giving enough relief while using the spirometer.  You develop fever of 100.5 F (38.1 C) or higher. SEEK IMMEDIATE MEDICAL CARE IF:   You cough up bloody sputum that had not been present before.  You develop fever of 102 F (38.9 C) or greater.  You develop worsening pain at or near the incision site. MAKE SURE YOU:   Understand these instructions.  Will watch your condition.  Will get help right away if you are not doing well or get worse. Document Released: 06/20/2006 Document Revised: 05/02/2011 Document Reviewed: 08/21/2006 [facility] Patient Information 2014 [place], [place].   ________________________________________________________________________

## 2016-01-19 NOTE — Progress Notes (Signed)
Spoke with Dr Neita Goodnight in regards to pts H&P (Opioid history). No orders given. Anesthesia to see pt day of surgery.

## 2016-01-22 ENCOUNTER — Other Ambulatory Visit (INDEPENDENT_AMBULATORY_CARE_PROVIDER_SITE_OTHER): Payer: Self-pay | Admitting: Physical Medicine and Rehabilitation

## 2016-01-28 ENCOUNTER — Encounter (HOSPITAL_COMMUNITY): Payer: Self-pay | Admitting: Certified Registered Nurse Anesthetist

## 2016-01-29 ENCOUNTER — Encounter (HOSPITAL_COMMUNITY)
Admission: RE | Disposition: A | Discharge status: Home / Self Care | Payer: Self-pay | Source: Ambulatory Visit | Attending: Orthopaedic Surgery

## 2016-01-29 ENCOUNTER — Ambulatory Visit (HOSPITAL_COMMUNITY)
Admission: RE | Admit: 2016-01-29 | Discharge: 2016-01-29 | Disposition: A | Discharge status: Home / Self Care | Payer: 59 | Source: Ambulatory Visit | Attending: Orthopaedic Surgery | Admitting: Orthopaedic Surgery

## 2016-01-29 DIAGNOSIS — Z408 Encounter for other prophylactic surgery: Secondary | ICD-10-CM | POA: Insufficient documentation

## 2016-01-29 DIAGNOSIS — M1611 Unilateral primary osteoarthritis, right hip: Secondary | ICD-10-CM

## 2016-01-29 SURGERY — ARTHROPLASTY, HIP, TOTAL, ANTERIOR APPROACH
Anesthesia: Choice | Laterality: Right

## 2016-01-29 MED ORDER — TRANEXAMIC ACID 1000 MG/10ML IV SOLN
1000.0000 mg | INTRAVENOUS | Status: DC
  Filled 2016-01-29: qty 10

## 2016-01-29 MED ORDER — CEFAZOLIN SODIUM-DEXTROSE 2-4 GM/100ML-% IV SOLN: 2.0000 g | INTRAVENOUS | Status: DC

## 2016-01-29 MED ORDER — CHLORHEXIDINE GLUCONATE 4 % EX LIQD: 60.0000 mL | Freq: Once | CUTANEOUS | Status: DC

## 2016-01-29 NOTE — Progress Notes (Signed)
Please add NEW SURGICAL ORDERS--The orders from today have dropped off in epic   thanks
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
## 2016-01-29 NOTE — Anesthesia Preprocedure Evaluation (Deleted)
Anesthesia Evaluation  Patient identified by MRN, date of birth, ID band Patient awake    Reviewed: Allergy & Precautions, NPO status , Patient's Chart, lab work & pertinent test results  History of Anesthesia Complications Negative for: history of anesthetic complications  Airway        Dental   Pulmonary COPD, Current Smoker,           Cardiovascular negative cardio ROS       Neuro/Psych PSYCHIATRIC DISORDERS Anxiety Depression negative neurological ROS  negative psych ROS   GI/Hepatic negative GI ROS, Neg liver ROS,   Endo/Other  negative endocrine ROS  Renal/GU negative Renal ROS  negative genitourinary   Musculoskeletal negative musculoskeletal ROS (+)   Abdominal   Peds negative pediatric ROS (+)  Hematology negative hematology ROS (+)   Anesthesia Other Findings   Reproductive/Obstetrics negative OB ROS                             Anesthesia Physical Anesthesia Plan  ASA: III  Anesthesia Plan:    Post-op Pain Management:    Induction:   Airway Management Planned:   Additional Equipment:   Intra-op Plan:   Post-operative Plan:   Informed Consent:   Plan Discussed with:   Anesthesia Plan Comments:         Anesthesia Quick Evaluation

## 2016-02-01 LAB — TYPE AND SCREEN
ABO/RH(D): A NEG
Antibody Screen: NEGATIVE

## 2016-02-04 ENCOUNTER — Other Ambulatory Visit (INDEPENDENT_AMBULATORY_CARE_PROVIDER_SITE_OTHER): Payer: Self-pay | Admitting: Physician Assistant

## 2016-02-05 ENCOUNTER — Encounter (HOSPITAL_COMMUNITY): Payer: Self-pay | Admitting: Anesthesiology

## 2016-02-05 ENCOUNTER — Inpatient Hospital Stay (HOSPITAL_COMMUNITY): Payer: 59

## 2016-02-05 ENCOUNTER — Inpatient Hospital Stay (HOSPITAL_COMMUNITY)
Admission: RE | Admit: 2016-02-05 | Discharge: 2016-02-06 | DRG: 470 | Disposition: A | Discharge status: Home Health | Payer: 59 | Source: Ambulatory Visit | Attending: Orthopaedic Surgery | Admitting: Orthopaedic Surgery

## 2016-02-05 ENCOUNTER — Inpatient Hospital Stay (HOSPITAL_COMMUNITY): Payer: 59 | Admitting: Anesthesiology

## 2016-02-05 ENCOUNTER — Encounter (HOSPITAL_COMMUNITY)
Admission: RE | Disposition: A | Discharge status: Home Health | Payer: Self-pay | Source: Ambulatory Visit | Attending: Orthopaedic Surgery

## 2016-02-05 DIAGNOSIS — M1611 Unilateral primary osteoarthritis, right hip: Principal | ICD-10-CM | POA: Diagnosis present

## 2016-02-05 DIAGNOSIS — Z886 Allergy status to analgesic agent status: Secondary | ICD-10-CM

## 2016-02-05 DIAGNOSIS — Z86010 Personal history of colonic polyps: Secondary | ICD-10-CM | POA: Diagnosis not present

## 2016-02-05 DIAGNOSIS — Z833 Family history of diabetes mellitus: Secondary | ICD-10-CM | POA: Diagnosis not present

## 2016-02-05 DIAGNOSIS — F17210 Nicotine dependence, cigarettes, uncomplicated: Secondary | ICD-10-CM | POA: Diagnosis present

## 2016-02-05 DIAGNOSIS — J449 Chronic obstructive pulmonary disease, unspecified: Secondary | ICD-10-CM | POA: Diagnosis present

## 2016-02-05 DIAGNOSIS — M810 Age-related osteoporosis without current pathological fracture: Secondary | ICD-10-CM | POA: Diagnosis present

## 2016-02-05 DIAGNOSIS — Z885 Allergy status to narcotic agent status: Secondary | ICD-10-CM | POA: Diagnosis not present

## 2016-02-05 DIAGNOSIS — Z7982 Long term (current) use of aspirin: Secondary | ICD-10-CM | POA: Diagnosis not present

## 2016-02-05 DIAGNOSIS — Z881 Allergy status to other antibiotic agents status: Secondary | ICD-10-CM | POA: Diagnosis not present

## 2016-02-05 DIAGNOSIS — G2581 Restless legs syndrome: Secondary | ICD-10-CM | POA: Diagnosis present

## 2016-02-05 DIAGNOSIS — F419 Anxiety disorder, unspecified: Secondary | ICD-10-CM | POA: Diagnosis present

## 2016-02-05 DIAGNOSIS — E785 Hyperlipidemia, unspecified: Secondary | ICD-10-CM | POA: Diagnosis present

## 2016-02-05 DIAGNOSIS — Z96641 Presence of right artificial hip joint: Secondary | ICD-10-CM

## 2016-02-05 DIAGNOSIS — M25551 Pain in right hip: Secondary | ICD-10-CM | POA: Diagnosis present

## 2016-02-05 DIAGNOSIS — F329 Major depressive disorder, single episode, unspecified: Secondary | ICD-10-CM | POA: Diagnosis present

## 2016-02-05 DIAGNOSIS — Z87442 Personal history of urinary calculi: Secondary | ICD-10-CM

## 2016-02-05 DIAGNOSIS — Z8249 Family history of ischemic heart disease and other diseases of the circulatory system: Secondary | ICD-10-CM | POA: Diagnosis not present

## 2016-02-05 DIAGNOSIS — Z419 Encounter for procedure for purposes other than remedying health state, unspecified: Secondary | ICD-10-CM | POA: Diagnosis not present

## 2016-02-05 DIAGNOSIS — Z79899 Other long term (current) drug therapy: Secondary | ICD-10-CM

## 2016-02-05 HISTORY — PX: TOTAL HIP ARTHROPLASTY: SHX124

## 2016-02-05 LAB — TYPE AND SCREEN
ABO/RH(D): A NEG
Antibody Screen: NEGATIVE

## 2016-02-05 SURGERY — ARTHROPLASTY, HIP, TOTAL, ANTERIOR APPROACH
Anesthesia: Monitor Anesthesia Care | Site: Hip | Laterality: Right

## 2016-02-05 MED ORDER — METHOCARBAMOL 1000 MG/10ML IJ SOLN
500.0000 mg | Freq: Four times a day (QID) | INTRAVENOUS | Status: DC | PRN
  Administered 2016-02-05: 500 mg via INTRAVENOUS
  Filled 2016-02-05: qty 550
  Filled 2016-02-05: qty 5

## 2016-02-05 MED ORDER — MENTHOL 3 MG MT LOZG: 1.0000 | LOZENGE | OROMUCOSAL | Status: DC | PRN

## 2016-02-05 MED ORDER — BUPIVACAINE IN DEXTROSE 0.75-8.25 % IT SOLN
INTRATHECAL | Status: DC | PRN
  Administered 2016-02-05: 2 mL via INTRATHECAL

## 2016-02-05 MED ORDER — DIPHENHYDRAMINE HCL 12.5 MG/5ML PO ELIX: 12.5000 mg | ORAL_SOLUTION | ORAL | Status: DC | PRN

## 2016-02-05 MED ORDER — ONDANSETRON HCL 4 MG/2ML IJ SOLN: 4.0000 mg | Freq: Four times a day (QID) | INTRAMUSCULAR | Status: DC | PRN

## 2016-02-05 MED ORDER — CEFAZOLIN SODIUM-DEXTROSE 2-4 GM/100ML-% IV SOLN
INTRAVENOUS | Status: AC
  Filled 2016-02-05: qty 100

## 2016-02-05 MED ORDER — CALCIUM CARBONATE-VITAMIN D3 600-400 MG-UNIT PO TABS: 1.0000 | ORAL_TABLET | Freq: Every day | ORAL | Status: DC

## 2016-02-05 MED ORDER — FLUTICASONE PROPIONATE 50 MCG/ACT NA SUSP: 1.0000 | Freq: Every day | NASAL | Status: DC | PRN

## 2016-02-05 MED ORDER — METOCLOPRAMIDE HCL 5 MG PO TABS
5.0000 mg | ORAL_TABLET | Freq: Three times a day (TID) | ORAL | Status: DC | PRN
Start: 2016-02-05 — End: 2016-02-06

## 2016-02-05 MED ORDER — SENNOSIDES-DOCUSATE SODIUM 8.6-50 MG PO TABS: 1.0000 | ORAL_TABLET | Freq: Every evening | ORAL | Status: DC | PRN

## 2016-02-05 MED ORDER — FENTANYL CITRATE (PF) 100 MCG/2ML IJ SOLN
INTRAMUSCULAR | Status: AC
  Filled 2016-02-05: qty 2

## 2016-02-05 MED ORDER — PROPOFOL 10 MG/ML IV BOLUS
INTRAVENOUS | Status: DC | PRN
  Administered 2016-02-05: 40 mg via INTRAVENOUS

## 2016-02-05 MED ORDER — EPHEDRINE SULFATE-NACL 50-0.9 MG/10ML-% IV SOSY
PREFILLED_SYRINGE | INTRAVENOUS | Status: DC | PRN
  Administered 2016-02-05 (×3): 5 mg via INTRAVENOUS

## 2016-02-05 MED ORDER — PROMETHAZINE HCL 25 MG/ML IJ SOLN: 6.2500 mg | INTRAMUSCULAR | Status: DC | PRN

## 2016-02-05 MED ORDER — LACTATED RINGERS IV SOLN
INTRAVENOUS | Status: DC
  Administered 2016-02-05 (×4): via INTRAVENOUS

## 2016-02-05 MED ORDER — IPRATROPIUM-ALBUTEROL 0.5-2.5 (3) MG/3ML IN SOLN: 3.0000 mL | Freq: Every day | RESPIRATORY_TRACT | Status: DC | PRN

## 2016-02-05 MED ORDER — ACETAMINOPHEN 325 MG PO TABS: 650.0000 mg | ORAL_TABLET | Freq: Four times a day (QID) | ORAL | Status: DC | PRN

## 2016-02-05 MED ORDER — DIAZEPAM 5 MG PO TABS
2.5000 mg | ORAL_TABLET | Freq: Every day | ORAL | Status: DC | PRN
Start: 2016-02-05 — End: 2016-02-06

## 2016-02-05 MED ORDER — METOCLOPRAMIDE HCL 5 MG/ML IJ SOLN: 5.0000 mg | Freq: Three times a day (TID) | INTRAMUSCULAR | Status: DC | PRN

## 2016-02-05 MED ORDER — CHLORHEXIDINE GLUCONATE 4 % EX LIQD: 60.0000 mL | Freq: Once | CUTANEOUS | Status: DC

## 2016-02-05 MED ORDER — DEXAMETHASONE SODIUM PHOSPHATE 10 MG/ML IJ SOLN
INTRAMUSCULAR | Status: DC | PRN
  Administered 2016-02-05: 10 mg via INTRAVENOUS

## 2016-02-05 MED ORDER — MIDAZOLAM HCL 2 MG/2ML IJ SOLN
INTRAMUSCULAR | Status: AC
  Filled 2016-02-05: qty 2

## 2016-02-05 MED ORDER — CEFAZOLIN SODIUM-DEXTROSE 2-4 GM/100ML-% IV SOLN
2.0000 g | INTRAVENOUS | Status: AC
  Administered 2016-02-05: 2 g via INTRAVENOUS

## 2016-02-05 MED ORDER — FENTANYL CITRATE (PF) 100 MCG/2ML IJ SOLN
INTRAMUSCULAR | Status: DC | PRN
  Administered 2016-02-05: 75 ug via INTRAVENOUS
  Administered 2016-02-05: 25 ug via INTRAVENOUS

## 2016-02-05 MED ORDER — KETOROLAC TROMETHAMINE 15 MG/ML IJ SOLN
7.5000 mg | Freq: Four times a day (QID) | INTRAMUSCULAR | Status: DC
  Administered 2016-02-05 – 2016-02-06 (×3): 7.5 mg via INTRAVENOUS
  Filled 2016-02-05 (×3): qty 1

## 2016-02-05 MED ORDER — ASPIRIN 81 MG PO CHEW
81.0000 mg | CHEWABLE_TABLET | Freq: Two times a day (BID) | ORAL | Status: DC
  Administered 2016-02-05 – 2016-02-06 (×2): 81 mg via ORAL
  Filled 2016-02-05 (×2): qty 1

## 2016-02-05 MED ORDER — TIOTROPIUM BROMIDE MONOHYDRATE 18 MCG IN CAPS
1.0000 | ORAL_CAPSULE | Freq: Every day | RESPIRATORY_TRACT | Status: DC
  Administered 2016-02-06: 18 ug via RESPIRATORY_TRACT
  Filled 2016-02-05: qty 5

## 2016-02-05 MED ORDER — ONDANSETRON HCL 4 MG/2ML IJ SOLN
INTRAMUSCULAR | Status: DC | PRN
Start: 2016-02-05 — End: 2016-02-05
  Administered 2016-02-05: 4 mg via INTRAVENOUS

## 2016-02-05 MED ORDER — FENTANYL CITRATE (PF) 100 MCG/2ML IJ SOLN
25.0000 ug | INTRAMUSCULAR | Status: DC | PRN
  Administered 2016-02-05 (×2): 50 ug via INTRAVENOUS

## 2016-02-05 MED ORDER — PROPOFOL 500 MG/50ML IV EMUL
INTRAVENOUS | Status: DC | PRN
  Administered 2016-02-05: 75 ug/kg/min via INTRAVENOUS

## 2016-02-05 MED ORDER — PROPOFOL 10 MG/ML IV BOLUS
INTRAVENOUS | Status: AC
  Filled 2016-02-05: qty 40

## 2016-02-05 MED ORDER — PRAMIPEXOLE DIHYDROCHLORIDE 0.25 MG PO TABS: 0.1250 mg | ORAL_TABLET | Freq: Two times a day (BID) | ORAL | Status: DC

## 2016-02-05 MED ORDER — ONDANSETRON HCL 4 MG PO TABS: 4.0000 mg | ORAL_TABLET | Freq: Four times a day (QID) | ORAL | Status: DC | PRN

## 2016-02-05 MED ORDER — IPRATROPIUM-ALBUTEROL 20-100 MCG/ACT IN AERS: 1.0000 | INHALATION_SPRAY | Freq: Every day | RESPIRATORY_TRACT | Status: DC | PRN

## 2016-02-05 MED ORDER — SODIUM CHLORIDE 0.9 % IV SOLN
INTRAVENOUS | Status: DC
  Administered 2016-02-05: 18:00:00 via INTRAVENOUS

## 2016-02-05 MED ORDER — SODIUM CHLORIDE 0.9 % IV SOLN
1000.0000 mg | INTRAVENOUS | Status: AC
  Administered 2016-02-05: 1000 mg via INTRAVENOUS
  Filled 2016-02-05: qty 1100

## 2016-02-05 MED ORDER — CITALOPRAM HYDROBROMIDE 20 MG PO TABS
40.0000 mg | ORAL_TABLET | Freq: Every day | ORAL | Status: DC
  Administered 2016-02-06: 40 mg via ORAL
  Filled 2016-02-05: qty 2

## 2016-02-05 MED ORDER — ALUM & MAG HYDROXIDE-SIMETH 200-200-20 MG/5ML PO SUSP: 30.0000 mL | ORAL | Status: DC | PRN

## 2016-02-05 MED ORDER — EPHEDRINE 5 MG/ML INJ
INTRAVENOUS | Status: AC
  Filled 2016-02-05: qty 10

## 2016-02-05 MED ORDER — SODIUM CHLORIDE 0.9 % IR SOLN
Status: DC | PRN
  Administered 2016-02-05: 1000 mL

## 2016-02-05 MED ORDER — CALCIUM CARBONATE-VITAMIN D 500-200 MG-UNIT PO TABS
1.0000 | ORAL_TABLET | Freq: Every day | ORAL | Status: DC
  Administered 2016-02-06: 1 via ORAL
  Filled 2016-02-05: qty 1

## 2016-02-05 MED ORDER — ACETAMINOPHEN 650 MG RE SUPP: 650.0000 mg | Freq: Four times a day (QID) | RECTAL | Status: DC | PRN

## 2016-02-05 MED ORDER — ATORVASTATIN CALCIUM 10 MG PO TABS
10.0000 mg | ORAL_TABLET | Freq: Every evening | ORAL | Status: DC
  Administered 2016-02-05: 10 mg via ORAL
  Filled 2016-02-05: qty 1

## 2016-02-05 MED ORDER — HYDROMORPHONE HCL 2 MG/ML IJ SOLN
1.0000 mg | INTRAMUSCULAR | Status: DC | PRN
  Administered 2016-02-05 (×2): 1 mg via INTRAVENOUS
  Filled 2016-02-05 (×2): qty 1

## 2016-02-05 MED ORDER — MIDAZOLAM HCL 5 MG/5ML IJ SOLN
INTRAMUSCULAR | Status: DC | PRN
  Administered 2016-02-05: 2 mg via INTRAVENOUS

## 2016-02-05 MED ORDER — METHOCARBAMOL 500 MG PO TABS
500.0000 mg | ORAL_TABLET | Freq: Four times a day (QID) | ORAL | Status: DC | PRN
  Administered 2016-02-05 – 2016-02-06 (×3): 500 mg via ORAL
  Filled 2016-02-05 (×3): qty 1

## 2016-02-05 MED ORDER — CEFAZOLIN IN D5W 1 GM/50ML IV SOLN
1.0000 g | Freq: Four times a day (QID) | INTRAVENOUS | Status: AC
  Administered 2016-02-05 – 2016-02-06 (×2): 1 g via INTRAVENOUS
  Filled 2016-02-05 (×2): qty 50

## 2016-02-05 MED ORDER — ZOLPIDEM TARTRATE 5 MG PO TABS: 5.0000 mg | ORAL_TABLET | Freq: Every evening | ORAL | Status: DC | PRN

## 2016-02-05 MED ORDER — PHENOL 1.4 % MT LIQD: 1.0000 | OROMUCOSAL | Status: DC | PRN

## 2016-02-05 MED ORDER — OXYCODONE HCL 5 MG PO TABS
5.0000 mg | ORAL_TABLET | ORAL | Status: DC | PRN
  Administered 2016-02-05: 10 mg via ORAL
  Administered 2016-02-06 (×2): 5 mg via ORAL
  Administered 2016-02-06 (×2): 10 mg via ORAL
  Filled 2016-02-05: qty 2
  Filled 2016-02-05 (×2): qty 1
  Filled 2016-02-05 (×2): qty 2

## 2016-02-05 SURGICAL SUPPLY — 37 items
BAG SPEC THK2 15X12 ZIP CLS (MISCELLANEOUS)
BAG ZIPLOCK 12X15 (MISCELLANEOUS) IMPLANT
BENZOIN TINCTURE PRP APPL 2/3 (GAUZE/BANDAGES/DRESSINGS) ×3 IMPLANT
BLADE SAW SGTL 18X1.27X75 (BLADE) ×2 IMPLANT
BLADE SAW SGTL 18X1.27X75MM (BLADE) ×1
CAPT HIP TOTAL 2 ×3 IMPLANT
CELLS DAT CNTRL 66122 CELL SVR (MISCELLANEOUS) ×1 IMPLANT
CLOSURE WOUND 1/2 X4 (GAUZE/BANDAGES/DRESSINGS) ×1
CLOTH BEACON ORANGE TIMEOUT ST (SAFETY) ×3 IMPLANT
DRAPE STERI IOBAN 125X83 (DRAPES) ×3 IMPLANT
DRAPE U-SHAPE 47X51 STRL (DRAPES) ×6 IMPLANT
DRSG AQUACEL AG ADV 3.5X10 (GAUZE/BANDAGES/DRESSINGS) ×3 IMPLANT
DURAPREP 26ML APPLICATOR (WOUND CARE) ×3 IMPLANT
ELECT REM PT RETURN 9FT ADLT (ELECTROSURGICAL) ×3
ELECTRODE REM PT RTRN 9FT ADLT (ELECTROSURGICAL) ×1 IMPLANT
GAUZE XEROFORM 1X8 LF (GAUZE/BANDAGES/DRESSINGS) IMPLANT
GLOVE BIO SURGEON STRL SZ7.5 (GLOVE) ×3 IMPLANT
GLOVE BIOGEL PI IND STRL 8 (GLOVE) ×2 IMPLANT
GLOVE BIOGEL PI INDICATOR 8 (GLOVE) ×4
GLOVE ECLIPSE 8.0 STRL XLNG CF (GLOVE) ×3 IMPLANT
GOWN STRL REUS W/TWL XL LVL3 (GOWN DISPOSABLE) ×6 IMPLANT
HANDPIECE INTERPULSE COAX TIP (DISPOSABLE) ×2
HOLDER FOLEY CATH W/STRAP (MISCELLANEOUS) ×3 IMPLANT
PACK ANTERIOR HIP CUSTOM (KITS) ×3 IMPLANT
RTRCTR WOUND ALEXIS 18CM MED (MISCELLANEOUS) ×3
SET HNDPC FAN SPRY TIP SCT (DISPOSABLE) ×1 IMPLANT
STAPLER VISISTAT 35W (STAPLE) IMPLANT
STRIP CLOSURE SKIN 1/2X4 (GAUZE/BANDAGES/DRESSINGS) ×2 IMPLANT
SUT ETHIBOND NAB CT1 #1 30IN (SUTURE) ×3 IMPLANT
SUT MNCRL AB 4-0 PS2 18 (SUTURE) IMPLANT
SUT VIC AB 0 CT1 36 (SUTURE) ×3 IMPLANT
SUT VIC AB 1 CT1 36 (SUTURE) ×3 IMPLANT
SUT VIC AB 2-0 CT1 27 (SUTURE) ×4
SUT VIC AB 2-0 CT1 TAPERPNT 27 (SUTURE) ×2 IMPLANT
TRAY FOLEY CATH 14FRSI W/METER (CATHETERS) ×3 IMPLANT
TRAY FOLEY W/METER SILVER 16FR (SET/KITS/TRAYS/PACK) IMPLANT
YANKAUER SUCT BULB TIP 10FT TU (MISCELLANEOUS) ×3 IMPLANT

## 2016-02-05 NOTE — Anesthesia Postprocedure Evaluation (Signed)
Anesthesia Post Note  Patient: MAÀLLE SCARPINO  Procedure(s) Performed: Procedure(s) (LRB): RIGHT TOTAL HIP ARTHROPLASTY ANTERIOR APPROACH (Right)  Patient location during evaluation: PACU Anesthesia Type: Spinal Level of consciousness: oriented and awake and alert Pain management: pain level controlled Vital Signs Assessment: post-procedure vital signs reviewed and stable Respiratory status: spontaneous breathing, respiratory function stable and patient connected to nasal cannula oxygen Cardiovascular status: blood pressure returned to baseline and stable Postop Assessment: no headache, no backache, spinal receding, no signs of nausea or vomiting and patient able to bend at knees Anesthetic complications: no    Last Vitals:  Vitals:   02/05/16 1600 02/05/16 1615  BP: (!) 108/57 (!) 119/44  Pulse: 62 62  Resp: 13 14  Temp: 36.4 C 36.5 C    Last Pain:  Vitals:   02/05/16 1600  TempSrc:   PainSc: 1                  Catalina Gravel

## 2016-02-05 NOTE — Brief Op Note (Signed)
02/05/2016  2:58 PM  PATIENT:  Erica Richardson  65 y.o. female  PRE-OPERATIVE DIAGNOSIS:  Osteoarthritis right hip  POST-OPERATIVE DIAGNOSIS:  Osteoarthritis right hip  PROCEDURE:  Procedure(s): RIGHT TOTAL HIP ARTHROPLASTY ANTERIOR APPROACH (Right)  SURGEON:  Surgeon(s) and Role:    * Mcarthur Rossetti, MD - Primary  PHYSICIAN ASSISTANT: Benita Stabile, PA-C  ANESTHESIA:   spinal  EBL:  Total I/O In: 1000 [I.V.:1000] Out: 350 [Urine:250; Blood:100]  COUNTS:  YES  DICTATION: .Other Dictation: Dictation Number [id number]  PLAN OF CARE: Admit to inpatient   PATIENT DISPOSITION:  PACU - hemodynamically stable.   Delay start of Pharmacological VTE agent (>24hrs) due to surgical blood loss or risk of bleeding: no
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
## 2016-02-05 NOTE — Transfer of Care (Signed)
Immediate Anesthesia Transfer of Care Note  Patient: Erica Richardson  Procedure(s) Performed: Procedure(s): RIGHT TOTAL HIP ARTHROPLASTY ANTERIOR APPROACH (Right)  Patient Location: PACU  Anesthesia Type:Spinal  Level of Consciousness:  sedated, patient cooperative and responds to stimulation  Airway & Oxygen Therapy:Patient Spontanous Breathing and Patient connected to face mask oxgen  Post-op Assessment:  Report given to PACU RN and Post -op Vital signs reviewed and stable  Post vital signs:  Reviewed and stable  Last Vitals:  Vitals:   02/05/16 1251  BP: (!) 128/46  Pulse: 68  Resp: 16  Temp: 123XX123 C    Complications: No apparent anesthesia complications

## 2016-02-05 NOTE — Anesthesia Preprocedure Evaluation (Signed)
Anesthesia Evaluation  Patient identified by MRN, date of birth, ID band Patient awake    Reviewed: Allergy & Precautions, NPO status , Patient's Chart, lab work & pertinent test results  Airway Mallampati: II  TM Distance: >3 FB Neck ROM: Full    Dental  (+) Teeth Intact, Dental Advisory Given   Pulmonary COPD, Current Smoker,    Pulmonary exam normal breath sounds clear to auscultation       Cardiovascular Exercise Tolerance: Good Normal cardiovascular exam Rhythm:Regular Rate:Normal  HLD   Neuro/Psych PSYCHIATRIC DISORDERS Anxiety Depression negative neurological ROS     GI/Hepatic negative GI ROS, Neg liver ROS,   Endo/Other  negative endocrine ROS  Renal/GU negative Renal ROS     Musculoskeletal  (+) Arthritis , Osteoarthritis,    Abdominal   Peds  Hematology negative hematology ROS (+) Plt 267k   Anesthesia Other Findings Day of surgery medications reviewed with the patient.  Reproductive/Obstetrics                             Anesthesia Physical Anesthesia Plan  ASA: II  Anesthesia Plan: Spinal and MAC   Post-op Pain Management:    Induction: Intravenous  Airway Management Planned: Simple Face Mask  Additional Equipment:   Intra-op Plan:   Post-operative Plan:   Informed Consent: I have reviewed the patients History and Physical, chart, labs and discussed the procedure including the risks, benefits and alternatives for the proposed anesthesia with the patient or authorized representative who has indicated his/her understanding and acceptance.   Dental advisory given  Plan Discussed with: CRNA, Anesthesiologist and Surgeon  Anesthesia Plan Comments: (Discussed risks and benefits of and differences between spinal and general. Discussed risks of spinal including headache, backache, failure, bleeding, infection, and nerve damage. Patient consents to spinal. Questions  answered. Coagulation studies and platelet count acceptable.)        Anesthesia Quick Evaluation

## 2016-02-05 NOTE — Anesthesia Procedure Notes (Signed)
Spinal  Patient location during procedure: OR Start time: 02/05/2016 2:23 PM End time: 02/05/2016 2:23 PM Staffing Resident/CRNA: Jondavid Schreier Performed: resident/CRNA  Preanesthetic Checklist Completed: patient identified, site marked, surgical consent, pre-op evaluation, timeout performed, IV checked, risks and benefits discussed and monitors and equipment checked Spinal Block Patient position: sitting Prep: DuraPrep Patient monitoring: heart rate, cardiac monitor, continuous pulse ox and blood pressure Approach: midline Location: L3-4 Injection technique: single-shot Needle Needle type: Spinocan  Needle gauge: 22 G Needle length: 9 cm Needle insertion depth: 7 cm Assessment Sensory level: T6 Additional Notes -heme, -para, VSS, pt tol well.  Lot and exp date ok.

## 2016-02-06 LAB — BASIC METABOLIC PANEL
ANION GAP: 7 (ref 5–15)
BUN: 12 mg/dL (ref 6–20)
CO2: 24 mmol/L (ref 22–32)
Calcium: 8.6 mg/dL — ABNORMAL LOW (ref 8.9–10.3)
Chloride: 105 mmol/L (ref 101–111)
Creatinine, Ser: 0.55 mg/dL (ref 0.44–1.00)
GFR calc non Af Amer: >60 mL/min (ref >60)
Glucose, Bld: 165 mg/dL — ABNORMAL HIGH (ref 65–99)
POTASSIUM: 4.5 mmol/L (ref 3.5–5.1)
Sodium: 136 mmol/L (ref 135–145)

## 2016-02-06 LAB — CBC
HEMATOCRIT: 32.3 % — AB (ref 36.0–46.0)
HEMOGLOBIN: 11 g/dL — AB (ref 12.0–15.0)
MCH: 32.7 pg (ref 26.0–34.0)
MCHC: 34.1 g/dL (ref 30.0–36.0)
MCV: 96.1 fL (ref 78.0–100.0)
Platelets: 251 10*3/uL (ref 150–400)
RBC: 3.36 MIL/uL — AB (ref 3.87–5.11)
RDW: 13.1 % (ref 11.5–15.5)
WBC: 12.2 10*3/uL — AB (ref 4.0–10.5)

## 2016-02-06 MED ORDER — ASPIRIN 81 MG PO TABS
81.0000 mg | ORAL_TABLET | Freq: Two times a day (BID) | ORAL | 0 refills | Status: DC
Start: 2016-02-06 — End: 2019-12-05

## 2016-02-06 MED ORDER — METHOCARBAMOL 500 MG PO TABS: 500.0000 mg | ORAL_TABLET | Freq: Four times a day (QID) | ORAL | 0 refills | Status: DC | PRN

## 2016-02-06 MED ORDER — OXYCODONE-ACETAMINOPHEN 5-325 MG PO TABS: 1.0000 | ORAL_TABLET | ORAL | 0 refills | Status: DC | PRN

## 2016-02-06 NOTE — Evaluation (Signed)
Physical Therapy Evaluation Patient Details Name: Erica Richardson MRN: [id number] DOB: 1950/12/06 Today's Date: 02/06/2016   History of Present Illness  s/p R DA THA  Clinical Impression  Pt is s/p THA resulting in the deficits listed below (see PT Problem List).  Pt will benefit from skilled PT to increase their independence and safety with mobility to allow discharge to the venue listed below. Pt motivated to D/C today, will see a second time for further education and stair training     Follow Up Recommendations Home health PT    Equipment Recommendations  Rolling walker with 5" wheels    Recommendations for Other Services       Precautions / Restrictions Precautions Precautions: Fall Restrictions Weight Bearing Restrictions: No Other Position/Activity Restrictions: WBAT      Mobility  Bed Mobility               General bed mobility comments:  (OOB to chair with OT)  Transfers Overall transfer level: Needs assistance Equipment used: Rolling walker (2 wheeled) Transfers: Sit to/from Stand Sit to Stand: Min assist;Min guard         General transfer comment: cues for UE/LE placement  Ambulation/Gait   Ambulation Distance (Feet): 80 Feet Assistive device: Rolling walker (2 wheeled) Gait Pattern/deviations: Step-to pattern     General Gait Details: cues for posture, sequence, RW position  Stairs            Wheelchair Mobility    Modified Rankin (Stroke Patients Only)       Balance                                             Pertinent Vitals/Pain Pain Assessment: 0-10 Faces Pain Scale: Hurts little more Pain Location: right hip Pain Descriptors / Indicators: Aching;Sore Pain Intervention(s): Limited activity within patient's tolerance;Monitored during session;Premedicated before session;Ice applied    Home Living Family/patient expects to be discharged to:: Private residence Living Arrangements: Spouse/significant  other;Children   Type of Home: House Home Access: Stairs to enter Entrance Stairs-Rails: Right Entrance Stairs-Number of Steps: 6 Home Layout: Two level;Able to live on main level with bedroom/bathroom Home Equipment: Gilford Rile - 2 wheels Additional Comments: plans to borrow 3in 1    Prior Function Level of Independence: Independent               Hand Dominance        Extremity/Trunk Assessment   Upper Extremity Assessment Upper Extremity Assessment: Defer to OT evaluation;Overall [facility] for tasks assessed    Lower Extremity Assessment Lower Extremity Assessment: RLE deficits/detail RLE Deficits / Details: grossly 2+ to 3/5 at hip, limited by post op pain; ankle WFL       Communication   Communication: No difficulties  Cognition Arousal/Alertness: Awake/alert Behavior During Therapy: WFL for tasks assessed/performed Overall Cognitive Status: Within Functional Limits for tasks assessed                      General Comments      Exercises Total Joint Exercises Ankle Circles/Pumps: AROM;Both;10 reps Quad Sets: 10 reps;Both;AROM Heel Slides: AAROM;AROM;Right;10 reps   Assessment/Plan    PT Assessment Patient needs continued PT services  PT Problem List Decreased strength;Decreased range of motion;Decreased activity tolerance;Decreased mobility;Decreased knowledge of use of DME          PT Treatment Interventions DME  instruction;Gait training;Functional mobility training;Therapeutic activities;Therapeutic exercise;Patient/family education;Stair training    PT Goals (Current goals can be found in the Care Plan section)  Acute Rehab PT Goals Patient Stated Goal: home today PT Goal Formulation: With patient Time For Goal Achievement: 02/08/16 Potential to Achieve Goals: Good    Frequency 7X/week   Barriers to discharge        Co-evaluation               End of Session   Activity Tolerance: Patient tolerated treatment well Patient left: in  chair;with call bell/phone within reach;with family/visitor present           Time: [id number] PT Time Calculation (min) (ACUTE ONLY): 28 min   Charges:   PT Evaluation $PT Eval Low Complexity: 1 Procedure PT Treatments $Gait Training: 8-22 mins   PT G Codes:        Paraskevi Funez 02/25/2016, 11:46 AM

## 2016-02-06 NOTE — Discharge Instructions (Signed)
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
## 2016-02-06 NOTE — Progress Notes (Signed)
   02/06/16 1400  PT Visit Information  Last PT Received On 02/06/16  Assistance Needed +1  History of Present Illness s/p R DA THA  Subjective Data  Patient Stated Goal home today  Precautions  Precautions Fall  Restrictions  Other Position/Activity Restrictions WBAT  Pain Assessment  Pain Assessment 0-10  Faces Pain Scale 4  Pain Location right hip  Pain Descriptors / Indicators Aching;Sore  Pain Intervention(s) Limited activity within patient's tolerance;Monitored during session  Cognition  Arousal/Alertness Awake/alert  Behavior During Therapy WFL for tasks assessed/performed  Overall Cognitive Status Within Functional Limits for tasks assessed  Bed Mobility  General bed mobility comments NT-in chair  Transfers  Overall transfer level Needs assistance  Equipment used Rolling walker (2 wheeled)  Transfers Sit to/from Stand  Sit to Stand Min guard  General transfer comment cues for UE/LE placement  Ambulation/Gait  Ambulation/Gait assistance Min guard;Supervision  Ambulation Distance (Feet) 30 Feet  Assistive device Rolling walker (2 wheeled)  Gait Pattern/deviations Step-to pattern  General Gait Details cues for posture, sequence, RW position  Stairs Yes  Stairs assistance Min assist  Stair Management One rail Right;One rail Left;Step to pattern;Sideways  Number of Stairs 6  General stair comments cues for technique and sequence  Total Joint Exercises  Ankle Circles/Pumps (gave h/o, instructed pt to perform ex's later today)  PT - End of Session  Activity Tolerance Patient tolerated treatment well  Patient left in chair;with call bell/phone within reach;with family/visitor present  PT - Assessment/Plan  PT Plan Current plan remains appropriate  PT Frequency (ACUTE ONLY) 7X/week  Follow Up Recommendations Home health PT  PT equipment Rolling walker with 5" wheels  PT Goal Progression  Progress towards PT goals Progressing toward goals  Acute Rehab PT Goals  PT  Goal Formulation With patient  Time For Goal Achievement 02/08/16  Potential to Achieve Goals Good  PT Time Calculation  PT Start Time (ACUTE ONLY) 1415  PT Stop Time (ACUTE ONLY) 1438  PT Time Calculation (min) (ACUTE ONLY) 23 min  PT General Charges  $$ ACUTE PT VISIT 1 Procedure  PT Treatments  $Gait Training 8-22 mins

## 2016-02-06 NOTE — Progress Notes (Signed)
Patient ID: Erica Richardson, female   DOB: 03/14/1950, 65 y.o.   MRN: [id number] Doing very well.  Can be discharged to home today.

## 2016-02-06 NOTE — Progress Notes (Signed)
Patient ID: Erica Richardson, female   DOB: 1950/05/24, 65 y.o.   MRN: [id number] Postoperative day 1 right total hip arthroplasty. Patient is doing well with therapy. We will have the tubes discontinued this morning. Possible discharge on Sunday.

## 2016-02-06 NOTE — Evaluation (Signed)
Occupational Therapy Evaluation Patient Details Name: Erica Richardson MRN: [id number] DOB: 10-15-1950 Today's Date: 02/06/2016    History of Present Illness s/p R DA THA   Clinical Impression   This 65 year old female was admitted for the above sx. She will benefit from continued OT in acute setting to further educate on tub transfers and reinforce education for toilet transfers.    Follow Up Recommendations  Supervision/Assistance - 24 hour    Equipment Recommendations   (possibly 3:1--pt may be able to borrow)    Recommendations for Other Services       Precautions / Restrictions Precautions Precautions: Fall Restrictions Weight Bearing Restrictions: No Other Position/Activity Restrictions: WBAT      Mobility Bed Mobility               General bed mobility comments: pt was sitting EOB when OT arrived  Transfers Overall transfer level: Needs assistance Equipment used: Rolling walker (2 wheeled) Transfers: Sit to/from Stand Sit to Stand: Min assist         General transfer comment: cues for UE/LE placement    Balance                                            ADL Overall ADL's : Needs assistance/impaired                         Toilet Transfer: Minimal assistance;Ambulation;BSC;RW             General ADL Comments: pt had catheter in place during evaluation.  Practiced transfer onto comfort height commode. Pt will benefit from 3:1 over this, and she may have access to one. She has a tub at home--educate on tub bench vs sponge bathing initially.  Husband present and will assist with adls as needed.  This was pt's first time up and walking     Vision     Perception     Praxis      Pertinent Vitals/Pain Pain Assessment: 0-10 Faces Pain Scale: Hurts little more Pain Location: right hip and sore L scapula Pain Descriptors / Indicators: Aching;Sore Pain Intervention(s): Limited activity within patient's  tolerance;Monitored during session;Premedicated before session;Ice applied     Hand Dominance     Extremity/Trunk Assessment Upper Extremity Assessment Upper Extremity Assessment: Overall WFL for tasks assessed   Lower Extremity Assessment Lower Extremity Assessment: RLE deficits/detail       Communication Communication Communication: No difficulties   Cognition Arousal/Alertness: Awake/alert Behavior During Therapy: WFL for tasks assessed/performed Overall Cognitive Status: Within Functional Limits for tasks assessed.  Needed multiple cues for sequencing                     General Comments       Exercises       Shoulder Instructions      Home Living Family/patient expects to be discharged to:: Private residence Living Arrangements: Spouse/significant other;Children   Type of Home: House Home Access: Stairs to enter [employer] of Steps: 6 Entrance Stairs-Rails: Right Home Layout: Two level;Able to live on main level with bedroom/bathroom               Home Equipment: Gilford Rile - 2 wheels   Additional Comments: plans to borrow 3in 1      Prior Functioning/Environment Level of Independence: Independent  OT Problem List: Decreased strength;Decreased activity tolerance;Decreased knowledge of use of DME or AE;Pain   OT Treatment/Interventions: Self-care/ADL training;DME and/or AE instruction;Patient/family education    OT Goals(Current goals can be found in the care plan section) Acute Rehab OT Goals Patient Stated Goal: home today OT Goal Formulation: With patient Time For Goal Achievement: 02/13/16 Potential to Achieve Goals: Good ADL Goals Pt Will Perform Grooming: (P) with supervision;standing Pt Will Transfer to Toilet: (P) with min guard assist;bedside commode;ambulating  OT Frequency: Min 2X/week   Barriers to D/C:            Co-evaluation              End of Session    Activity Tolerance:  Patient tolerated treatment well Patient left: in chair;with call bell/phone within reach;with family/visitor present   Time: [id number] OT Time Calculation (min): 25 min Charges:  OT General Charges $OT Visit: 1 Procedure OT Evaluation $OT Eval Low Complexity: 1 Procedure G-Codes:    Erica Richardson 02/15/2016, 9:51 AM Lesle Chris, OTR/L [phone] Feb 15, 2016

## 2016-02-08 NOTE — Op Note (Signed)
NAME:  Erica Richardson, Erica Richardson                   ACCOUNT NO.:  MEDICAL RECORD NO.:  [id number]  LOCATION:                                 FACILITY:  PHYSICIAN:  Lind Guest. Ninfa Linden, M.D.DATE OF BIRTH:  DATE OF PROCEDURE:  02/05/2016 DATE OF DISCHARGE:                              OPERATIVE REPORT   PREOPERATIVE DIAGNOSES:  Osteoarthritis, degenerative joint disease, right hip.  POSTOPERATIVE DIAGNOSES:  Osteoarthritis, degenerative joint disease, right hip.  PROCEDURE:  Right total hip arthroplasty through direct anterior approach.  IMPLANTS:  DePuy Sector Gription acetabular component size 50, size 32+ 0 polyethylene liner, size 13 Corail femoral component with standard offset, size 32+ 1 ceramic hip ball.  SURGEON:  Lind Guest. Ninfa Linden, M.D.  ASSISTANT:  Erskine Emery, PA-C.  ANESTHESIA:  Spinal.  ANTIBIOTICS:  2 g of IV Ancef.  BLOOD LOSS:  100 mL.  COMPLICATIONS:  None.  INDICATIONS:  Ms. Aikin is a very pleasant 65 year old female with debilitating right hip pain.  She has tried and failed all forms of conservative treatment.  At this point with failure of conservative treatment and the offers of her hip, we have recommended a hip replacement.  This is to be done through a direct anterior approach.  We described to her the risks of acute blood loss anemia, nerve and vessel injury, fracture, infection, dislocation, DVT.  She understands our goals are decreased pain, improved mobility, and overall improved quality of life.  PROCEDURE DESCRIPTION:  After informed consent was obtained, appropriate right hip was marked, she was brought to the operating room.  Spinal anesthesia was obtained while she was on her stretcher.  She was then laid in a supine position on the stretcher.  A Foley catheter was placed and both feet had traction boots applied to them.  Next, she was placed supine on the Hana fracture table.  The perineal post in place and both legs in inline  skeletal traction devices, but no traction applied.  Her right operative hip was prepped and draped with DuraPrep and sterile drapes.  Time-out was called.  She was identified as correct patient and correct right hip.  I then made an incision inferior and posterior to the anterior superior iliac spine and carried this obliquely down the leg.  We dissected down the tensor fascia lata muscle and tensor fascia was then divided longitudinally, so we could proceed with direct anterior approach to the hip.  We identified and cauterized the circumflex vessels, then identified the hip capsule.  I opened up the hip capsule in L-type format finding moderate joint effusion and significant periarticular osteophytes.  We placed Cobra retractors on the medial, lateral femoral neck and then made our femoral neck cut with an oscillating saw and finished this with an osteotome.  We placed a corkscrew guide in the femoral head and removed the femoral head in its entirety.  We then placed a bent Hohmann over the medial acetabular rim and removed remnants of acetabular labrum.  I then began reaming under direct visualization and direct fluoroscopy from a size 42 reamer up to a size 50.  With a size 50 reamer, we were able to get our  depth of reaming, my inclination and anteversion.  Once we were pleased with this, we placed the real DePuy Sector Gription acetabular component size 50 and a 32+ 0 neutral polyethylene liner for that size acetabular component.  Attention was then turned to the femur with the leg externally rotated to 120 degrees, extended and adducted.  We were to place a Mueller retractor medially and a Hohmann retractor behind the greater trochanter.  We released the joint capsule, used a box cutting osteotome to enter femoral canal and rongeur lateralized.  I then began broaching using the Corail broaching system from a size 8 to a size 13. With the 13 in place, we trialed a standard offset  femoral neck and a 32+ 1 hip ball.  We brought the leg back over and up with traction and rotation reducing the pelvis.  We were pleased with leg length, offset, range of motion and stability.  This was also assessed under direct fluoroscopy.  We then dislocated the hip and removed the trial components.  We placed Corail femoral component size 13 with standard offset, the real 32+ 1 ceramic hip ball reduced.  Acetabulum again it was stable.  We then copiously irrigated the soft tissue with normal saline solution using pulsatile lavage.  We closed the joint capsule with interrupted #1 Ethibond suture, followed by running #1 Vicryl to the tensor fascia, 0 Vicryl in the deep tissue, 2-0 Vicryl in the subcutaneous tissue, 4-0 Monocryl subcuticular stitch and Steri-Strips on the skin.  An Aquacel dressing was applied.  She was taken off the Hana table, taken to the recovery room in stable condition.  All final counts were correct.  There were no complications noted.  Of note, Erskine Emery, PA-C assisted in the entire case.  His assistance was crucial for facilitating all aspects of this case.     Lind Guest. Ninfa Linden, M.D.     CYB/MEDQ  D:  02/05/2016  T:  02/06/2016  Job:  [id number]

## 2016-02-09 ENCOUNTER — Telehealth (INDEPENDENT_AMBULATORY_CARE_PROVIDER_SITE_OTHER): Payer: Self-pay | Admitting: Radiology

## 2016-02-09 ENCOUNTER — Telehealth (INDEPENDENT_AMBULATORY_CARE_PROVIDER_SITE_OTHER): Payer: Self-pay | Admitting: Orthopaedic Surgery

## 2016-02-09 NOTE — Telephone Encounter (Signed)
[facility] called to advise they will see pt today to start HH services. 

## 2016-02-09 NOTE — Telephone Encounter (Signed)
noted 

## 2016-02-09 NOTE — Telephone Encounter (Signed)
Darlina Guys from [facility] called just to let us know a phys therapist will be out to see the pt today. Thanks!

## 2016-02-10 ENCOUNTER — Telehealth (INDEPENDENT_AMBULATORY_CARE_PROVIDER_SITE_OTHER): Payer: Self-pay | Admitting: Orthopaedic Surgery

## 2016-02-10 NOTE — Telephone Encounter (Signed)
Patient called advised she has a hacking cough and want to know what she can take to get rid of it. The number to contact her is [phone]

## 2016-02-10 NOTE — Telephone Encounter (Signed)
Please advise 

## 2016-02-10 NOTE — Telephone Encounter (Signed)
I called and left message giving her verbal auth for this.

## 2016-02-11 ENCOUNTER — Inpatient Hospital Stay (INDEPENDENT_AMBULATORY_CARE_PROVIDER_SITE_OTHER): Payer: 59 | Admitting: Orthopaedic Surgery

## 2016-02-11 NOTE — Telephone Encounter (Signed)
She needs to check with her PCP about couch

## 2016-02-11 NOTE — Telephone Encounter (Signed)
I called and advised the patient of D. Blackman's response below.

## 2016-02-14 ENCOUNTER — Inpatient Hospital Stay (HOSPITAL_COMMUNITY)
Admission: EM | Admit: 2016-02-14 | Discharge: 2016-02-16 | DRG: 190 | Disposition: A | Discharge status: Home / Self Care | Payer: 59 | Attending: Nephrology | Admitting: Nephrology

## 2016-02-14 ENCOUNTER — Emergency Department (HOSPITAL_COMMUNITY): Payer: 59

## 2016-02-14 ENCOUNTER — Encounter (HOSPITAL_COMMUNITY): Payer: Self-pay

## 2016-02-14 DIAGNOSIS — E785 Hyperlipidemia, unspecified: Secondary | ICD-10-CM | POA: Diagnosis present

## 2016-02-14 DIAGNOSIS — Z8249 Family history of ischemic heart disease and other diseases of the circulatory system: Secondary | ICD-10-CM | POA: Diagnosis not present

## 2016-02-14 DIAGNOSIS — Z87442 Personal history of urinary calculi: Secondary | ICD-10-CM

## 2016-02-14 DIAGNOSIS — Z87828 Personal history of other (healed) physical injury and trauma: Secondary | ICD-10-CM | POA: Diagnosis not present

## 2016-02-14 DIAGNOSIS — Z886 Allergy status to analgesic agent status: Secondary | ICD-10-CM | POA: Diagnosis not present

## 2016-02-14 DIAGNOSIS — G4700 Insomnia, unspecified: Secondary | ICD-10-CM | POA: Diagnosis present

## 2016-02-14 DIAGNOSIS — Z96641 Presence of right artificial hip joint: Secondary | ICD-10-CM | POA: Diagnosis present

## 2016-02-14 DIAGNOSIS — Z833 Family history of diabetes mellitus: Secondary | ICD-10-CM

## 2016-02-14 DIAGNOSIS — G8918 Other acute postprocedural pain: Secondary | ICD-10-CM

## 2016-02-14 DIAGNOSIS — Z79899 Other long term (current) drug therapy: Secondary | ICD-10-CM | POA: Diagnosis not present

## 2016-02-14 DIAGNOSIS — Z885 Allergy status to narcotic agent status: Secondary | ICD-10-CM | POA: Diagnosis not present

## 2016-02-14 DIAGNOSIS — J441 Chronic obstructive pulmonary disease with (acute) exacerbation: Secondary | ICD-10-CM | POA: Diagnosis present

## 2016-02-14 DIAGNOSIS — J449 Chronic obstructive pulmonary disease, unspecified: Secondary | ICD-10-CM | POA: Diagnosis present

## 2016-02-14 DIAGNOSIS — J9601 Acute respiratory failure with hypoxia: Secondary | ICD-10-CM | POA: Diagnosis present

## 2016-02-14 DIAGNOSIS — F17210 Nicotine dependence, cigarettes, uncomplicated: Secondary | ICD-10-CM | POA: Diagnosis present

## 2016-02-14 DIAGNOSIS — F329 Major depressive disorder, single episode, unspecified: Secondary | ICD-10-CM | POA: Diagnosis present

## 2016-02-14 DIAGNOSIS — M7989 Other specified soft tissue disorders: Secondary | ICD-10-CM | POA: Diagnosis not present

## 2016-02-14 DIAGNOSIS — F419 Anxiety disorder, unspecified: Secondary | ICD-10-CM | POA: Diagnosis present

## 2016-02-14 DIAGNOSIS — M810 Age-related osteoporosis without current pathological fracture: Secondary | ICD-10-CM | POA: Diagnosis present

## 2016-02-14 DIAGNOSIS — Z881 Allergy status to other antibiotic agents status: Secondary | ICD-10-CM | POA: Diagnosis not present

## 2016-02-14 DIAGNOSIS — Z7982 Long term (current) use of aspirin: Secondary | ICD-10-CM

## 2016-02-14 DIAGNOSIS — Z79891 Long term (current) use of opiate analgesic: Secondary | ICD-10-CM

## 2016-02-14 DIAGNOSIS — R52 Pain, unspecified: Secondary | ICD-10-CM

## 2016-02-14 DIAGNOSIS — R0602 Shortness of breath: Secondary | ICD-10-CM | POA: Diagnosis present

## 2016-02-14 LAB — BLOOD GAS, VENOUS
Acid-Base Excess: 4.4 mmol/L — ABNORMAL HIGH (ref 0.0–2.0)
Bicarbonate: 30.2 mmol/L — ABNORMAL HIGH (ref 20.0–28.0)
O2 SAT: 31.8 %
PCO2 VEN: 52.3 mmHg (ref 44.0–60.0)
PH VEN: 7.379 (ref 7.250–7.430)
Patient temperature: 98.6

## 2016-02-14 LAB — COMPREHENSIVE METABOLIC PANEL
ALT: 47 U/L (ref 14–54)
ANION GAP: 10 (ref 5–15)
AST: 38 U/L (ref 15–41)
Albumin: 3.4 g/dL — ABNORMAL LOW (ref 3.5–5.0)
Alkaline Phosphatase: 106 U/L (ref 38–126)
BILIRUBIN TOTAL: 0.4 mg/dL (ref 0.3–1.2)
BUN: 15 mg/dL (ref 6–20)
CO2: 29 mmol/L (ref 22–32)
Calcium: 9.1 mg/dL (ref 8.9–10.3)
Chloride: 102 mmol/L (ref 101–111)
Creatinine, Ser: 0.72 mg/dL (ref 0.44–1.00)
Glucose, Bld: 105 mg/dL — ABNORMAL HIGH (ref 65–99)
POTASSIUM: 3.7 mmol/L (ref 3.5–5.1)
Sodium: 141 mmol/L (ref 135–145)
TOTAL PROTEIN: 6.6 g/dL (ref 6.5–8.1)

## 2016-02-14 LAB — CBC WITH DIFFERENTIAL/PLATELET
BASOS PCT: 0 %
Basophils Absolute: 0 10*3/uL (ref 0.0–0.1)
EOS ABS: 0.1 10*3/uL (ref 0.0–0.7)
EOS PCT: 1 %
HEMATOCRIT: 27.9 % — AB (ref 36.0–46.0)
Hemoglobin: 9.4 g/dL — ABNORMAL LOW (ref 12.0–15.0)
LYMPHS ABS: 4.1 10*3/uL — AB (ref 0.7–4.0)
Lymphocytes Relative: 43 %
MCH: 32.5 pg (ref 26.0–34.0)
MCHC: 33.7 g/dL (ref 30.0–36.0)
MCV: 96.5 fL (ref 78.0–100.0)
MONO ABS: 1.4 10*3/uL — AB (ref 0.1–1.0)
Monocytes Relative: 14 %
Neutro Abs: 4.1 10*3/uL (ref 1.7–7.7)
Neutrophils Relative %: 42 %
Platelets: 364 10*3/uL (ref 150–400)
RBC: 2.89 MIL/uL — AB (ref 3.87–5.11)
RDW: 13.3 % (ref 11.5–15.5)
WBC: 9.7 10*3/uL (ref 4.0–10.5)

## 2016-02-14 LAB — TROPONIN I: Troponin I: <0.03 ng/mL (ref <0.03)

## 2016-02-14 MED ORDER — IPRATROPIUM-ALBUTEROL 0.5-2.5 (3) MG/3ML IN SOLN
3.0000 mL | Freq: Once | RESPIRATORY_TRACT | Status: AC
  Administered 2016-02-14: 3 mL via RESPIRATORY_TRACT
  Filled 2016-02-14: qty 3

## 2016-02-14 MED ORDER — DIAZEPAM 5 MG PO TABS
2.5000 mg | ORAL_TABLET | Freq: Every day | ORAL | Status: DC | PRN
  Administered 2016-02-14 – 2016-02-15 (×2): 2.5 mg via ORAL
  Filled 2016-02-14 (×2): qty 1

## 2016-02-14 MED ORDER — FLUTICASONE PROPIONATE 50 MCG/ACT NA SUSP
1.0000 | Freq: Every day | NASAL | Status: DC | PRN
  Administered 2016-02-14 – 2016-02-15 (×2): 1 via NASAL
  Filled 2016-02-14: qty 16

## 2016-02-14 MED ORDER — OXYCODONE-ACETAMINOPHEN 5-325 MG PO TABS
1.0000 | ORAL_TABLET | ORAL | Status: DC | PRN
  Administered 2016-02-16: 1 via ORAL
  Filled 2016-02-14 (×3): qty 1

## 2016-02-14 MED ORDER — PREDNISONE 50 MG PO TABS
50.0000 mg | ORAL_TABLET | Freq: Every day | ORAL | Status: DC
  Administered 2016-02-15 – 2016-02-16 (×2): 50 mg via ORAL
  Filled 2016-02-14 (×3): qty 1

## 2016-02-14 MED ORDER — PROMETHAZINE HCL 25 MG PO TABS: 25.0000 mg | ORAL_TABLET | Freq: Four times a day (QID) | ORAL | Status: DC | PRN

## 2016-02-14 MED ORDER — TRAMADOL HCL 50 MG PO TABS
50.0000 mg | ORAL_TABLET | Freq: Four times a day (QID) | ORAL | Status: DC | PRN
  Administered 2016-02-15 – 2016-02-16 (×4): 50 mg via ORAL
  Filled 2016-02-14 (×4): qty 1

## 2016-02-14 MED ORDER — IPRATROPIUM BROMIDE 0.02 % IN SOLN
0.5000 mg | Freq: Once | RESPIRATORY_TRACT | Status: AC
Start: 2016-02-14 — End: 2016-02-14
  Administered 2016-02-14: 0.5 mg via RESPIRATORY_TRACT
  Filled 2016-02-14: qty 2.5

## 2016-02-14 MED ORDER — SODIUM CHLORIDE 0.9 % IV BOLUS (SEPSIS)
1000.0000 mL | Freq: Once | INTRAVENOUS | Status: AC
  Administered 2016-02-14: 1000 mL via INTRAVENOUS

## 2016-02-14 MED ORDER — METHOCARBAMOL 500 MG PO TABS
500.0000 mg | ORAL_TABLET | Freq: Four times a day (QID) | ORAL | Status: DC | PRN
  Administered 2016-02-14 – 2016-02-16 (×3): 500 mg via ORAL
  Filled 2016-02-14 (×3): qty 1

## 2016-02-14 MED ORDER — LEVOFLOXACIN IN D5W 750 MG/150ML IV SOLN
750.0000 mg | INTRAVENOUS | Status: DC
  Administered 2016-02-15: 750 mg via INTRAVENOUS
  Filled 2016-02-14: qty 150

## 2016-02-14 MED ORDER — PANTOPRAZOLE SODIUM 40 MG PO TBEC
40.0000 mg | DELAYED_RELEASE_TABLET | Freq: Every day | ORAL | Status: DC
  Administered 2016-02-14 – 2016-02-16 (×3): 40 mg via ORAL
  Filled 2016-02-14 (×3): qty 1

## 2016-02-14 MED ORDER — ONDANSETRON HCL 4 MG/2ML IJ SOLN
4.0000 mg | Freq: Four times a day (QID) | INTRAMUSCULAR | Status: DC | PRN
  Administered 2016-02-14: 4 mg via INTRAVENOUS
  Filled 2016-02-14: qty 2

## 2016-02-14 MED ORDER — BENZONATATE 100 MG PO CAPS
200.0000 mg | ORAL_CAPSULE | Freq: Three times a day (TID) | ORAL | Status: DC | PRN
  Administered 2016-02-14 – 2016-02-16 (×3): 200 mg via ORAL
  Filled 2016-02-14 (×3): qty 2

## 2016-02-14 MED ORDER — TIOTROPIUM BROMIDE MONOHYDRATE 18 MCG IN CAPS
18.0000 ug | ORAL_CAPSULE | Freq: Every day | RESPIRATORY_TRACT | Status: DC
  Filled 2016-02-14: qty 5

## 2016-02-14 MED ORDER — METHYLPREDNISOLONE SODIUM SUCC 125 MG IJ SOLR
125.0000 mg | Freq: Once | INTRAMUSCULAR | Status: AC
  Administered 2016-02-14: 125 mg via INTRAVENOUS
  Filled 2016-02-14: qty 2

## 2016-02-14 MED ORDER — ACETAMINOPHEN 325 MG PO TABS
650.0000 mg | ORAL_TABLET | Freq: Once | ORAL | Status: AC
  Administered 2016-02-14: 650 mg via ORAL
  Filled 2016-02-14: qty 2

## 2016-02-14 MED ORDER — IOPAMIDOL (ISOVUE-370) INJECTION 76%
INTRAVENOUS | Status: AC
  Administered 2016-02-14: 100 mL via INTRAVENOUS
  Filled 2016-02-14: qty 100

## 2016-02-14 MED ORDER — ASPIRIN EC 81 MG PO TBEC
81.0000 mg | DELAYED_RELEASE_TABLET | Freq: Two times a day (BID) | ORAL | Status: DC
  Administered 2016-02-15 – 2016-02-16 (×3): 81 mg via ORAL
  Filled 2016-02-14 (×4): qty 1

## 2016-02-14 MED ORDER — IPRATROPIUM-ALBUTEROL 0.5-2.5 (3) MG/3ML IN SOLN
3.0000 mL | Freq: Four times a day (QID) | RESPIRATORY_TRACT | Status: DC | PRN
  Administered 2016-02-15: 3 mL via RESPIRATORY_TRACT
  Filled 2016-02-14: qty 3

## 2016-02-14 MED ORDER — CALCIUM CARBONATE-VITAMIN D 500-200 MG-UNIT PO TABS
1.0000 | ORAL_TABLET | Freq: Every day | ORAL | Status: DC
  Administered 2016-02-14 – 2016-02-16 (×3): 1 via ORAL
  Filled 2016-02-14 (×3): qty 1

## 2016-02-14 MED ORDER — ALBUTEROL (5 MG/ML) CONTINUOUS INHALATION SOLN
10.0000 mg/h | INHALATION_SOLUTION | Freq: Once | RESPIRATORY_TRACT | Status: AC
  Administered 2016-02-14: 10 mg/h via RESPIRATORY_TRACT
  Filled 2016-02-14: qty 20

## 2016-02-14 MED ORDER — CITALOPRAM HYDROBROMIDE 10 MG PO TABS
40.0000 mg | ORAL_TABLET | Freq: Every day | ORAL | Status: DC
  Administered 2016-02-14 – 2016-02-16 (×3): 40 mg via ORAL
  Filled 2016-02-14 (×3): qty 4

## 2016-02-14 MED ORDER — ENOXAPARIN SODIUM 40 MG/0.4ML ~~LOC~~ SOLN
40.0000 mg | SUBCUTANEOUS | Status: DC
  Administered 2016-02-14 – 2016-02-15 (×2): 40 mg via SUBCUTANEOUS
  Filled 2016-02-14 (×2): qty 0.4

## 2016-02-14 MED ORDER — PRAMIPEXOLE DIHYDROCHLORIDE 0.125 MG PO TABS
0.1250 mg | ORAL_TABLET | Freq: Three times a day (TID) | ORAL | Status: DC
  Administered 2016-02-14 – 2016-02-16 (×5): 0.125 mg via ORAL
  Filled 2016-02-14 (×6): qty 1

## 2016-02-14 MED ORDER — LEVOFLOXACIN IN D5W 750 MG/150ML IV SOLN
750.0000 mg | Freq: Once | INTRAVENOUS | Status: AC
  Administered 2016-02-14: 750 mg via INTRAVENOUS
  Filled 2016-02-14: qty 150

## 2016-02-14 MED ORDER — OXYCODONE-ACETAMINOPHEN 5-325 MG PO TABS: 1.0000 | ORAL_TABLET | ORAL | Status: DC | PRN

## 2016-02-14 MED ORDER — ATORVASTATIN CALCIUM 10 MG PO TABS
10.0000 mg | ORAL_TABLET | Freq: Every day | ORAL | Status: DC
Start: 2016-02-14 — End: 2016-02-16
  Administered 2016-02-14 – 2016-02-15 (×2): 10 mg via ORAL
  Filled 2016-02-14 (×2): qty 1

## 2016-02-14 NOTE — ED Notes (Signed)
Bed: WA21 Expected date:  Expected time:  Means of arrival:  Comments: 65 yo SOB

## 2016-02-14 NOTE — ED Triage Notes (Signed)
PT RECEIVED FROM [facility] VIA EMS FOR SOB. PER EMS, THE PT'S INITIAL O2 SAT WAS 88% AT THE OFFICE. THE PT WAS GIVEN O2 AT 2L VIA [place]. PER PT, SHE HAD BEEN SOB SINCE Wednesday. SHE STS SHE HAD A DRY COUGH 3 WEEKS AGO, AND DX'D WITH BRONCHITIS, BUT STS THE COUGH NEVER WENT AWAY. SHE IS ALSO S/P RIGHT HIP REPLACEMENT 1 WEEK AGO, AND THAT IS WHY THE OFFICE SENT HER HERE FOR EVALUATION FOR POSSIBLE PE. PT HAS A HX OF COPD, BUT DOES NOT USE OXYGEN AT HOME. PT DENIES CHEST PAIN AT THIS TIME.

## 2016-02-14 NOTE — Progress Notes (Signed)
Pharmacy Antibiotic Note  Erica Richardson is a 65 y.o. female with hx of COPD, recent right hip replacement on 12/15 now with dyspnea and nausea admitted on 02/14/2016 with COPD exacerbation.  Pharmacy has been consulted for Levaquin dosing.  Plan: Levaquin 750 mg IV q24h  Height: 5\' 3"  (160 cm) Weight: 155 lb (70.3 kg) IBW/kg (Calculated) : 52.4  Temp (24hrs), Avg:98.6 F (37 C), Min:98.6 F (37 C), Max:98.6 F (37 C)   Recent Labs Lab 02/14/16 1529  WBC 9.7  CREATININE 0.72    Estimated Creatinine Clearance: 67.7 mL/min (by C-G formula based on SCr of 0.72 mg/dL).    Allergies  Allergen Reactions  . Codeine Other (See Comments)    Upset stomach  . Ibuprofen Other (See Comments)    Upset stomach  . Azithromycin Rash  . Erythromycin Rash    Antimicrobials this admission: 12/24 Levaquin >>    >>   Dose adjustments this admission:   Microbiology results:  BCx:   UCx:    Sputum:    MRSA PCR:   Thank you for allowing pharmacy to be a part of this patient's care.  Dorrene German 02/14/2016 7:48 PM
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
## 2016-02-15 DIAGNOSIS — J9601 Acute respiratory failure with hypoxia: Secondary | ICD-10-CM

## 2016-02-15 MED ORDER — TEMAZEPAM 15 MG PO CAPS
15.0000 mg | ORAL_CAPSULE | Freq: Every evening | ORAL | Status: DC | PRN
  Administered 2016-02-15 (×2): 15 mg via ORAL
  Filled 2016-02-15 (×2): qty 1

## 2016-02-15 MED ORDER — LIP MEDEX EX OINT
TOPICAL_OINTMENT | CUTANEOUS | Status: AC
  Administered 2016-02-15: 09:00:00
  Filled 2016-02-15: qty 7

## 2016-02-15 MED ORDER — DM-GUAIFENESIN ER 30-600 MG PO TB12
1.0000 | ORAL_TABLET | Freq: Two times a day (BID) | ORAL | Status: DC
  Administered 2016-02-15 – 2016-02-16 (×3): 1 via ORAL
  Filled 2016-02-15 (×3): qty 1

## 2016-02-15 MED ORDER — SALINE SPRAY 0.65 % NA SOLN
1.0000 | NASAL | Status: DC | PRN
  Filled 2016-02-15: qty 44

## 2016-02-15 NOTE — Progress Notes (Signed)
SATURATION QUALIFICATIONS: (This note is used to comply with regulatory documentation for home oxygen)  Patient Saturations on Room Air at Rest 94 Patient Saturations on Room Air while Ambulating =88  Patient Saturations on 0 Liters of oxygen while Ambulating = *90  Please briefly explain why patient needs home oxygen:

## 2016-02-15 NOTE — Progress Notes (Signed)
Patient complaints of restlessness and unable to sleep. Valium administered beginning of shift at 2100 with minimal effect. Patient requesting sleep aid or something to relax. Notified D. Langeland, MD. New orders received for Restoril 15mg  QHS PRN. Will administer and continue to monitor.

## 2016-02-15 NOTE — Progress Notes (Signed)
PROGRESS NOTE    Erica Richardson  [id number] DOB: 12/18/1950 DOA: 02/14/2016 PCP: Gennette Pac, MD   Brief Narrative: 65 yo female with hx of COPD, recent R.hip replacement on 12/15th who did well for the first two days after surgery then she started having dry cough associated with dyspnea and nausea without any chest pain, fever or chills.  Assessment & Plan:  # Possible acute COPD exacerbation versus left lower lobe infectious bronchiolitis: -Continue Levaquin, prednisone, Spiriva, duoneb. -Added Mucinex,  -Abnormal CT scan findings reviewed with the patient and her son at bedside. Recommended for repeat CT scan of chest in 6 months to 1 year. They verbalized understanding. -CT chest with no PE.  #Acute hypoxic respiratory failure: Management as able. Currently requiring 2 L of oxygen. Try to wean off oxygen gradually. -Encourage incentive spirometer. -Discussed with the patient and her nurse  #Right lower extremity swelling, asymmetry: Given recent history of surgery I will order Doppler ultrasound to rule out DVT.   Active Problems:   COPD exacerbation ([place])  Continue home medication. Restoril when necessary for insomnia.  DVT prophylaxis: Lovenox subcutaneous Code Status: Full code Family Communication: Discussed with the patient's son at bedside Disposition Plan: Likely discharge home tomorrow if able to wean off oxygen.  Consultants:   None  Procedures: CT chest Antimicrobials: Levaquin  Subjective: Patient was seen and examined at bedside. Patient reported dry cough but denied chest pain, shortness of breath. No fever or chills, nausea or vomiting. Has a right lower extremity swelling. Wanted to go home today. Became hypoxic in room air.   Objective: Vitals:   02/14/16 2043 02/15/16 0421 02/15/16 1337 02/15/16 1347  BP: (!) 125/46 119/76 (!) 131/59   Pulse: 87 88 (!) 58   Resp: 18 16 18    Temp: 97.8 F (36.6 C) 98 F (36.7 C) 97.6 F (36.4 C)     TempSrc: Oral Oral Oral   SpO2: 95% 100% 91% (!) 87%  Weight:      Height:        Intake/Output Summary (Last 24 hours) at 02/15/16 1442 Last data filed at 02/15/16 0957  Gross per 24 hour  Intake             1630 ml  Output                0 ml  Net             1630 ml   Filed Weights   02/14/16 1428  Weight: 70.3 kg (155 lb)    Examination:  General exam: Appears calm and comfortable  Respiratory system: Clear to auscultation. Respiratory effort normal. No wheezing or crackle Cardiovascular system: S1 & S2 heard, RRR.  No pedal edema. Gastrointestinal system: Abdomen is nondistended, soft and nontender. Normal bowel sounds heard. Central nervous system: Alert and oriented. No focal neurological deficits. Extremities: Symmetric 5 x 5 power. Skin: No rashes, lesions or ulcers Psychiatry: Judgement and insight appear normal. Mood & affect appropriate.     Data Reviewed: I have personally reviewed following labs and imaging studies  CBC:  Recent Labs Lab 02/14/16 1529  WBC 9.7  NEUTROABS 4.1  HGB 9.4*  HCT 27.9*  MCV 96.5  PLT 123456   Basic Metabolic Panel:  Recent Labs Lab 02/14/16 1529  NA 141  K 3.7  CL 102  CO2 29  GLUCOSE 105*  BUN 15  CREATININE 0.72  CALCIUM 9.1   GFR: Estimated Creatinine Clearance: 67.7 mL/min (by  C-G formula based on SCr of 0.72 mg/dL). Liver Function Tests:  Recent Labs Lab 02/14/16 1529  AST 38  ALT 47  ALKPHOS 106  BILITOT 0.4  PROT 6.6  ALBUMIN 3.4*   No results for input(s): LIPASE, AMYLASE in the last 168 hours. No results for input(s): AMMONIA in the last 168 hours. Coagulation Profile: No results for input(s): INR, PROTIME in the last 168 hours. Cardiac Enzymes:  Recent Labs Lab 02/14/16 1529  TROPONINI <0.03   BNP (last 3 results) No results for input(s): PROBNP in the last 8760 hours. HbA1C: No results for input(s): HGBA1C in the last 72 hours. CBG: No results for input(s): GLUCAP in the last  168 hours. Lipid Profile: No results for input(s): CHOL, HDL, LDLCALC, TRIG, CHOLHDL, LDLDIRECT in the last 72 hours. Thyroid Function Tests: No results for input(s): TSH, T4TOTAL, FREET4, T3FREE, THYROIDAB in the last 72 hours. Anemia Panel: No results for input(s): VITAMINB12, FOLATE, FERRITIN, TIBC, IRON, RETICCTPCT in the last 72 hours. Sepsis Labs: No results for input(s): PROCALCITON, LATICACIDVEN in the last 168 hours.  No results found for this or any previous visit (from the past 240 hour(s)).       Radiology Studies: Ct Angio Chest Pe W And/or Wo Contrast  Result Date: 02/14/2016 CLINICAL DATA:  Shortness of breath. Dry cough 3 weeks ago. Right hip replacement 1 week ago. COPD. EXAM: CT ANGIOGRAPHY CHEST WITH CONTRAST TECHNIQUE: Multidetector CT imaging of the chest was performed using the standard protocol during bolus administration of intravenous contrast. Multiplanar CT image reconstructions and MIPs were obtained to evaluate the vascular anatomy. CONTRAST:  156ml omni 370 COMPARISON:  Plain films 08/04/2011.  No prior CT. FINDINGS: Cardiovascular: The quality of this exam for evaluation of pulmonary embolism is good. No evidence of pulmonary embolism. Tortuous thoracic aorta. Normal heart size, without pericardial effusion. Mediastinum/Nodes: No mediastinal or hilar adenopathy. Lungs/Pleura: No pleural fluid.  Mild centrilobular emphysema. Bilateral, but left lower lobe predominant peribronchovascular nodularity, with "Tree-in-bud" morphology. Example image 59/series 12. Subpleural left lower lobe 5 mm nodule on image 73/series 12. Upper Abdomen: Normal imaged portions of the liver, spleen, stomach. Musculoskeletal: Mild accentuation of expected thoracic kyphosis. Review of the MIP images confirms the above findings. IMPRESSION: 1.  No evidence of pulmonary embolism. 2. Bilateral but left lower lobe predominant peribronchovascular nodularity, likely related to infectious  bronchiolitis. 3. Mild centrilobular emphysema. 5 mm left lower lobe pulmonary nodule. Non-contrast chest CT can be considered in 12 months, given risk factor(s). This recommendation follows the consensus statement: Guidelines for Management of Incidental Pulmonary Nodules Detected on CT Images:From the Fleischner Society 2017; published online before print (10.1148/radiol.IJ:2314499). Electronically Signed   By: Abigail Miyamoto M.D.   On: 02/14/2016 17:17        Scheduled Meds: . aspirin EC  81 mg Oral BID PC  . atorvastatin  10 mg Oral Daily  . calcium-vitamin D  1 tablet Oral Daily  . citalopram  40 mg Oral Daily  . enoxaparin (LOVENOX) injection  40 mg Subcutaneous Q24H  . levofloxacin (LEVAQUIN) IV  750 mg Intravenous Q24H  . pantoprazole  40 mg Oral Daily  . pramipexole  0.125 mg Oral TID  . predniSONE  50 mg Oral Q breakfast  . tiotropium  18 mcg Inhalation Q1200   Continuous Infusions:   LOS: 1 day    Dron Tanna Furry, MD [facility] Pager [phone]  If 7PM-7AM, please contact night-coverage [email or web address] Password [email or web address] 02/15/2016, 2:42 PM

## 2016-02-16 ENCOUNTER — Inpatient Hospital Stay (HOSPITAL_COMMUNITY): Payer: 59

## 2016-02-16 DIAGNOSIS — M7989 Other specified soft tissue disorders: Secondary | ICD-10-CM

## 2016-02-16 MED ORDER — OXYCODONE-ACETAMINOPHEN 5-325 MG PO TABS
2.0000 | ORAL_TABLET | Freq: Once | ORAL | Status: AC
  Administered 2016-02-16: 2 via ORAL

## 2016-02-16 MED ORDER — LEVOFLOXACIN 750 MG PO TABS: 750.0000 mg | ORAL_TABLET | Freq: Every day | ORAL | 0 refills | Status: AC

## 2016-02-16 MED ORDER — PREDNISONE 50 MG PO TABS: 50.0000 mg | ORAL_TABLET | Freq: Every day | ORAL | 0 refills | Status: AC

## 2016-02-16 NOTE — Progress Notes (Signed)
**  Preliminary report by tech**  Right lower extremity venous duplex complete. There is no evidence of deep or superficial vein thrombosis involving the right lower extremity. All visualized vessels appear patent and compressible. There is no evidence of a Baker's cyst on the right.  02/16/16 9:03 AM Carlos Levering RVT

## 2016-02-16 NOTE — Discharge Summary (Signed)
Physician Discharge Summary  Erica Richardson [id number] DOB: June 12, 1950 DOA: 02/14/2016  PCP: Gennette Pac, MD  Admit date: 02/14/2016 Discharge date: 02/16/2016  Admitted From:Home Disposition: Home    Recommendations for Outpatient Follow-up:  1. Follow up with PCP in 1-2 weeks 2. Please obtain BMP/CBC in one week   Home Health: No Equipment/Devices: No Discharge Condition: Stable CODE STATUS: Full code Diet recommendation: Heart healthy  Brief/Interim Summary:65 yo female with hx of COPD, recent R.hip replacement on 12/15th who did well for the first two days after surgery then she started having dry cough associated with dyspnea and nausea without any chest pain, fever or chills.  # Possible acute COPD exacerbation versus left lower lobe infectious bronchiolitis: -Treated with Levaquin, prednisone, Spiriva, duoneb with improvement in symptoms. Denied chest pain, shortness of breath. Oxygenation acceptable in room air. -Abnormal CT scan findings reviewed with the patient and her son at bedside on 112/25. Recommended for repeat CT scan of chest in 6 months to 1 year. They verbalized understanding. -CT chest with no Pe. -Discharge with short course of prednisone to complete total 5 days. Also with oral Levaquin. Advised to continue bronchodilators and outpatient follow-up.  #Acute hypoxic respiratory failure: Improved. Her oxygen saturation acceptable in room air today. She feels fine.  #Right lower extremity swelling, asymmetry: Given recent history of surgery Doppler ultrasound of lower extremity obtained which was negative for DVT. Patient was complaining of the pain at the site of recent right hip surgery last night therefore the x-ray was obtained. X-ray looks unremarkable. Patient denied any pain today. I advised patient to follow-up with her PCP and orthopedics outpatient. Verbalized understanding. Patient's husband at bedside.   Discharge Diagnoses:  Active  Problems:   COPD exacerbation ([place])   Acute respiratory failure with hypoxia [facility])    Discharge Instructions  Discharge Instructions    Call MD for:  difficulty breathing, headache or visual disturbances    Complete by:  As directed    Call MD for:  hives    Complete by:  As directed    Call MD for:  persistant dizziness or light-headedness    Complete by:  As directed    Call MD for:  persistant nausea and vomiting    Complete by:  As directed    Call MD for:  severe uncontrolled pain    Complete by:  As directed    Call MD for:  temperature >100.4    Complete by:  As directed    Diet - low sodium heart healthy    Complete by:  As directed    Discharge instructions    Complete by:  As directed    Your CT scan chest showed" Mild centrilobular emphysema. 5 mm left lower lobe pulmonary nodule. Non-contrast chest CT can be considered in 12 months.   Increase activity slowly    Complete by:  As directed      Allergies as of 02/16/2016      Reactions   Codeine Other (See Comments)   Upset stomach   Ibuprofen Other (See Comments)   Upset stomach   Azithromycin Rash   Erythromycin Rash      Medication List    TAKE these medications   aspirin 81 MG tablet Take 1 tablet (81 mg total) by mouth 2 (two) times daily after a meal.   atorvastatin 10 MG tablet Commonly known as:  LIPITOR Take 10 mg by mouth daily.   benzonatate 200 MG capsule Commonly known as:  TESSALON Take 200 mg by mouth 3 (three) times daily as needed for cough.   CALCIUM 600-D 600-400 MG-UNIT Tabs Generic drug:  Calcium Carbonate-Vitamin D3 Take 1 tablet by mouth daily.   citalopram 40 MG tablet Commonly known as:  CELEXA Take 40 mg by mouth daily.   COMBIVENT RESPIMAT 20-100 MCG/ACT Aers respimat Generic drug:  Ipratropium-Albuterol Inhale 1 puff into the lungs every 6 (six) hours as needed for shortness of breath.   diazepam 5 MG tablet Commonly known as:  VALIUM Take 2.5 mg by mouth daily  as needed for anxiety.   fluticasone 50 MCG/ACT nasal spray Commonly known as:  FLONASE Place 1 spray into both nostrils daily as needed for allergies or rhinitis.   levofloxacin 750 MG tablet Commonly known as:  LEVAQUIN Take 1 tablet (750 mg total) by mouth daily.   methocarbamol 500 MG tablet Commonly known as:  ROBAXIN Take 1 tablet (500 mg total) by mouth every 6 (six) hours as needed for muscle spasms.   oxyCODONE-acetaminophen 5-325 MG tablet Commonly known as:  ROXICET Take 1-2 tablets by mouth every 4 (four) hours as needed. What changed:  reasons to take this   pramipexole 0.125 MG tablet Commonly known as:  MIRAPEX Take 0.125 mg by mouth 3 (three) times daily.   predniSONE 50 MG tablet Commonly known as:  DELTASONE Take 1 tablet (50 mg total) by mouth daily with breakfast.   promethazine 25 MG tablet Commonly known as:  PHENERGAN Take 25 mg by mouth every 6 (six) hours as needed for nausea or vomiting.   SPIRIVA RESPIMAT 2.5 MCG/ACT Aers Generic drug:  Tiotropium Bromide Monohydrate Inhale 2 puffs into the lungs daily at 12 noon.      Follow-up Information    Gennette Pac, MD. Schedule an appointment as soon as possible for a visit in 1 week(s).   Specialty:  Family Medicine Contact information: [place] [address] [phone]          Allergies  Allergen Reactions  . Codeine Other (See Comments)    Upset stomach  . Ibuprofen Other (See Comments)    Upset stomach  . Azithromycin Rash  . Erythromycin Rash    Consultations: None   Procedures/Studies: none  Subjective: The patient was seen and examined at bedside. Reported feeling much better. Denied headache, dizziness, nausea vomiting chest pain, shortness of breath. Has dry cough. Husband at bedside.   Discharge Exam: Vitals:   02/15/16 2046 02/16/16 0440  BP: 135/62 126/68  Pulse: (!) 55 (!) 52  Resp: 16 14  Temp: 98.3 F (36.8 C) 97.7 F (36.5 C)    Vitals:   02/15/16 1347 02/15/16 2046 02/16/16 0440 02/16/16 1015  BP:  135/62 126/68   Pulse:  (!) 55 (!) 52   Resp:  16 14   Temp:  98.3 F (36.8 C) 97.7 F (36.5 C)   TempSrc:  Oral Oral   SpO2: (!) 87% 95% 92% 94%  Weight:      Height:        General: Pt is alert, awake, not in acute distress Cardiovascular: RRR, S1/S2 +, no rubs, no gallops Respiratory: Bilateral mild intermittent wheeze otherwise clear, no crackle. Abdominal: Soft, NT, ND, bowel sounds + Extremities: no edema, no cyanosis    The results of significant diagnostics from this hospitalization (including imaging, microbiology, ancillary and laboratory) are listed below for reference.     Microbiology: No results found for this or any previous visit (  from the past 240 hour(s)).   Labs: BNP (last 3 results) No results for input(s): BNP in the last 8760 hours. Basic Metabolic Panel:  Recent Labs Lab 02/14/16 1529  NA 141  K 3.7  CL 102  CO2 29  GLUCOSE 105*  BUN 15  CREATININE 0.72  CALCIUM 9.1   Liver Function Tests:  Recent Labs Lab 02/14/16 1529  AST 38  ALT 47  ALKPHOS 106  BILITOT 0.4  PROT 6.6  ALBUMIN 3.4*   No results for input(s): LIPASE, AMYLASE in the last 168 hours. No results for input(s): AMMONIA in the last 168 hours. CBC:  Recent Labs Lab 02/14/16 1529  WBC 9.7  NEUTROABS 4.1  HGB 9.4*  HCT 27.9*  MCV 96.5  PLT 364   Cardiac Enzymes:  Recent Labs Lab 02/14/16 1529  TROPONINI <0.03   BNP: Invalid input(s): POCBNP CBG: No results for input(s): GLUCAP in the last 168 hours. D-Dimer No results for input(s): DDIMER in the last 72 hours. Hgb A1c No results for input(s): HGBA1C in the last 72 hours. Lipid Profile No results for input(s): CHOL, HDL, LDLCALC, TRIG, CHOLHDL, LDLDIRECT in the last 72 hours. Thyroid function studies No results for input(s): TSH, T4TOTAL, T3FREE, THYROIDAB in the last 72 hours.  Invalid input(s): FREET3 Anemia work  up No results for input(s): VITAMINB12, FOLATE, FERRITIN, TIBC, IRON, RETICCTPCT in the last 72 hours. Urinalysis    Component Value Date/Time   COLORURINE YELLOW 02/06/2010 0532   APPEARANCEUR CLEAR 02/06/2010 0532   LABSPEC 1.017 02/06/2010 0532   PHURINE 5.5 02/06/2010 0532   GLUCOSEU NEGATIVE 02/06/2010 0532   HGBUR TRACE (A) 02/06/2010 0532   BILIRUBINUR NEGATIVE 02/06/2010 0532   KETONESUR NEGATIVE 02/06/2010 0532   PROTEINUR NEGATIVE 02/06/2010 0532   UROBILINOGEN 0.2 02/06/2010 0532   NITRITE NEGATIVE 02/06/2010 0532   LEUKOCYTESUR SMALL (A) 02/06/2010 0532   Sepsis Labs Invalid input(s): PROCALCITONIN,  WBC,  LACTICIDVEN Microbiology No results found for this or any previous visit (from the past 240 hour(s)).   Time coordinating discharge: 26 minutes  SIGNED:   Rosita Fire, MD  [facility] 02/16/2016, 11:09 AM  If 7PM-7AM, please contact night-coverage [email or web address] Password [email or web address]

## 2016-02-18 ENCOUNTER — Telehealth (INDEPENDENT_AMBULATORY_CARE_PROVIDER_SITE_OTHER): Payer: Self-pay | Admitting: Orthopaedic Surgery

## 2016-02-18 ENCOUNTER — Ambulatory Visit (INDEPENDENT_AMBULATORY_CARE_PROVIDER_SITE_OTHER): Payer: 59 | Admitting: Orthopaedic Surgery

## 2016-02-18 DIAGNOSIS — M1611 Unilateral primary osteoarthritis, right hip: Secondary | ICD-10-CM

## 2016-02-18 NOTE — Telephone Encounter (Signed)
Maria from [facility] wanted to let us know they are planning to see pt 2x a week for one week and is requesting verbal orders for this. Verdis Frederickson [phone]

## 2016-02-18 NOTE — Progress Notes (Signed)
The patient is now 13 days status post a right total hip arthroplasty through direct injury approach. She examined with a cane. Postoperatively she did have pneumonia and had to be readmitted on December 25. She is released the next day on antibiotics. She says she is doing well. She is and living with a rolling walker. She said she is ready to have home health therapy and tomorrow.  On examination her incision looks good. I removed one suture from the top of the incision. I placed knee Steri-Strips. I did drain a seroma about 60 mL.  At this point she'll go back to her just daily aspirin. She'll continue increase her activities as tolerates. I did give her refill her Percocet. I'll see her back in 4 weeks to see how she doing overall.

## 2016-02-19 NOTE — Telephone Encounter (Signed)
Verbal order left on VM  

## 2016-02-26 ENCOUNTER — Telehealth (INDEPENDENT_AMBULATORY_CARE_PROVIDER_SITE_OTHER): Payer: Self-pay

## 2016-02-26 NOTE — Telephone Encounter (Signed)
Patient called stating that she is having shooting pain in her inner right ankle to her knee.  States she has some swelling and is 3 weeks out from a Hip replacement, but states the selling is going down.  Concerned about the pain.  Walking with a cane and elevating. [phone] Please Advise. Thank You

## 2016-02-29 ENCOUNTER — Telehealth (INDEPENDENT_AMBULATORY_CARE_PROVIDER_SITE_OTHER): Payer: Self-pay | Admitting: Orthopaedic Surgery

## 2016-02-29 NOTE — Telephone Encounter (Signed)
Patient called asked if she can get a sooner appointment with Dr Ninfa Linden. Patient said she has fluid buildup in her hip.  Patient said she has a pain inside her ankle that runs up her knee. Patient said she has been talking to a nurse from [facility] for 2 wks and the nurse asked her to call and see if she can get in sooner. The number to contact patient is [phone]

## 2016-03-01 NOTE — Telephone Encounter (Signed)
Appointment made with [facility] Thursday

## 2016-03-03 ENCOUNTER — Encounter (INDEPENDENT_AMBULATORY_CARE_PROVIDER_SITE_OTHER): Payer: Self-pay | Admitting: Physician Assistant

## 2016-03-03 ENCOUNTER — Other Ambulatory Visit (INDEPENDENT_AMBULATORY_CARE_PROVIDER_SITE_OTHER): Payer: Self-pay | Admitting: Orthopaedic Surgery

## 2016-03-03 ENCOUNTER — Ambulatory Visit (INDEPENDENT_AMBULATORY_CARE_PROVIDER_SITE_OTHER): Payer: 59 | Admitting: Physician Assistant

## 2016-03-03 DIAGNOSIS — Z96641 Presence of right artificial hip joint: Secondary | ICD-10-CM

## 2016-03-03 MED ORDER — TRAMADOL HCL 50 MG PO TABS: 50.0000 mg | ORAL_TABLET | Freq: Four times a day (QID) | ORAL | 0 refills | Status: DC | PRN

## 2016-03-03 MED ORDER — MUPIROCIN 2 % EX OINT: 1.0000 "application " | TOPICAL_OINTMENT | Freq: Every day | CUTANEOUS | 0 refills | Status: DC

## 2016-03-03 MED ORDER — METHOCARBAMOL 500 MG PO TABS: 500.0000 mg | ORAL_TABLET | Freq: Four times a day (QID) | ORAL | 0 refills | Status: DC | PRN
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
## 2016-03-08 ENCOUNTER — Ambulatory Visit (INDEPENDENT_AMBULATORY_CARE_PROVIDER_SITE_OTHER): Payer: 59 | Admitting: Family

## 2016-03-08 DIAGNOSIS — Z96641 Presence of right artificial hip joint: Secondary | ICD-10-CM

## 2016-03-08 MED ORDER — OXYCODONE-ACETAMINOPHEN 5-325 MG PO TABS: 1.0000 | ORAL_TABLET | Freq: Four times a day (QID) | ORAL | 0 refills | Status: DC | PRN
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
## 2016-03-10 ENCOUNTER — Encounter (INDEPENDENT_AMBULATORY_CARE_PROVIDER_SITE_OTHER): Payer: Self-pay | Admitting: Orthopaedic Surgery

## 2016-03-14 ENCOUNTER — Telehealth (INDEPENDENT_AMBULATORY_CARE_PROVIDER_SITE_OTHER): Payer: Self-pay | Admitting: Orthopaedic Surgery

## 2016-03-14 NOTE — Telephone Encounter (Signed)
We need to see her first.

## 2016-03-14 NOTE — Telephone Encounter (Signed)
Patient called asked if she can be referred to have an MRI on her right leg. Patient said she does not think that  This is from the surgery. The number to contact her is [phone]

## 2016-03-14 NOTE — Telephone Encounter (Signed)
Please advise 

## 2016-03-14 NOTE — Telephone Encounter (Signed)
Patient aware of the below message  

## 2016-03-17 ENCOUNTER — Ambulatory Visit (INDEPENDENT_AMBULATORY_CARE_PROVIDER_SITE_OTHER): Payer: 59 | Admitting: Orthopaedic Surgery

## 2016-03-17 DIAGNOSIS — Z96641 Presence of right artificial hip joint: Secondary | ICD-10-CM

## 2016-03-17 MED ORDER — HYDROCODONE-ACETAMINOPHEN 5-325 MG PO TABS: 1.0000 | ORAL_TABLET | Freq: Four times a day (QID) | ORAL | 0 refills | Status: DC | PRN

## 2016-03-17 MED ORDER — METHYLPREDNISOLONE 4 MG PO TABS: ORAL_TABLET | ORAL | 0 refills | Status: DC

## 2016-03-17 MED ORDER — TIZANIDINE HCL 4 MG PO TABS: 4.0000 mg | ORAL_TABLET | Freq: Three times a day (TID) | ORAL | 0 refills | Status: DC | PRN

## 2016-03-17 NOTE — Progress Notes (Signed)
The patient is now 5 weeks status post a right total hip arthroplasty. She still ambulates with a cane and has had a seroma drained twice were hip. She's been having knee pain on the right side as well as foot and ankle pain. She says she has a history of a clubfoot type of deformity.  On examination of her hip incision actually looks good. There is firmness of the muscle but no significant seroma today that needs to be drained at all. Her knee is cool. There is medial joint line tenderness but seems to be more strained the ligaments around the knee from her hip replacement surgery because is no knee effusion. Her Lockman's and McMurray's are negative. Her ankle has global swelling and she only dorsiflex to neutral. Her calf is soft.  At this point I gave her encouragement that she should continue get better. I would like to try Medrol Dosepak combined with Zanaflex and wean her to hydrocodone for the Percocet. We'll see her back in 2 weeks to see how she doing overall.

## 2016-03-22 ENCOUNTER — Encounter (INDEPENDENT_AMBULATORY_CARE_PROVIDER_SITE_OTHER): Payer: Self-pay | Admitting: Orthopaedic Surgery

## 2016-03-29 ENCOUNTER — Ambulatory Visit (INDEPENDENT_AMBULATORY_CARE_PROVIDER_SITE_OTHER): Payer: 59

## 2016-03-29 ENCOUNTER — Encounter (INDEPENDENT_AMBULATORY_CARE_PROVIDER_SITE_OTHER): Payer: Self-pay | Admitting: Orthopedic Surgery

## 2016-03-29 ENCOUNTER — Ambulatory Visit (INDEPENDENT_AMBULATORY_CARE_PROVIDER_SITE_OTHER): Payer: 59 | Admitting: Orthopedic Surgery

## 2016-03-29 DIAGNOSIS — M25473 Effusion, unspecified ankle: Secondary | ICD-10-CM

## 2016-03-29 DIAGNOSIS — M25476 Effusion, unspecified foot: Secondary | ICD-10-CM

## 2016-03-29 DIAGNOSIS — M25471 Effusion, right ankle: Secondary | ICD-10-CM | POA: Insufficient documentation

## 2016-03-29 DIAGNOSIS — M25474 Effusion, right foot: Secondary | ICD-10-CM | POA: Insufficient documentation

## 2016-03-29 DIAGNOSIS — M25475 Effusion, left foot: Secondary | ICD-10-CM | POA: Insufficient documentation
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
## 2016-03-31 ENCOUNTER — Encounter (INDEPENDENT_AMBULATORY_CARE_PROVIDER_SITE_OTHER): Payer: Self-pay | Admitting: Physician Assistant

## 2016-03-31 ENCOUNTER — Ambulatory Visit (INDEPENDENT_AMBULATORY_CARE_PROVIDER_SITE_OTHER): Payer: 59 | Admitting: Physician Assistant

## 2016-03-31 DIAGNOSIS — Z96641 Presence of right artificial hip joint: Secondary | ICD-10-CM

## 2016-03-31 MED ORDER — GABAPENTIN 300 MG PO CAPS: 300.0000 mg | ORAL_CAPSULE | Freq: Every day | ORAL | 0 refills | Status: DC
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
## 2016-04-14 ENCOUNTER — Ambulatory Visit (INDEPENDENT_AMBULATORY_CARE_PROVIDER_SITE_OTHER): Payer: 59 | Admitting: Physician Assistant

## 2016-04-26 ENCOUNTER — Ambulatory Visit (INDEPENDENT_AMBULATORY_CARE_PROVIDER_SITE_OTHER): Payer: 59 | Admitting: Orthopaedic Surgery

## 2016-04-26 DIAGNOSIS — Z96641 Presence of right artificial hip joint: Secondary | ICD-10-CM

## 2016-04-26 DIAGNOSIS — M25571 Pain in right ankle and joints of right foot: Secondary | ICD-10-CM

## 2016-04-26 DIAGNOSIS — M25561 Pain in right knee: Secondary | ICD-10-CM | POA: Diagnosis not present

## 2016-04-26 MED ORDER — METHYLPREDNISOLONE ACETATE 40 MG/ML IJ SUSP
40.0000 mg | INTRAMUSCULAR | Status: AC | PRN
  Administered 2016-04-26: 40 mg via INTRA_ARTICULAR
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
## 2016-05-11 ENCOUNTER — Ambulatory Visit (INDEPENDENT_AMBULATORY_CARE_PROVIDER_SITE_OTHER): Payer: 59 | Admitting: Orthopaedic Surgery

## 2016-05-19 ENCOUNTER — Telehealth (INDEPENDENT_AMBULATORY_CARE_PROVIDER_SITE_OTHER): Payer: Self-pay | Admitting: *Deleted

## 2016-05-19 NOTE — Telephone Encounter (Signed)
Pt is at the [facility] this morning for a cleaning. Pt last had hip surgery in December and the office is calling for premed if necessary. If pt needs to reschedule, they have an opening Monday 4/2 and asking if it could be called in before then.

## 2016-05-19 NOTE — Telephone Encounter (Signed)
Patient aware she does NOT need antibiotic before cleaning

## 2016-05-19 NOTE — Telephone Encounter (Signed)
She does not need an antibioitc at this point.

## 2016-05-19 NOTE — Telephone Encounter (Signed)
Do you want her to take something this once since she's right over 3 months? Or no need?

## 2016-07-06 ENCOUNTER — Ambulatory Visit (INDEPENDENT_AMBULATORY_CARE_PROVIDER_SITE_OTHER): Payer: BLUE CROSS/BLUE SHIELD | Admitting: Physical Medicine and Rehabilitation

## 2016-07-06 ENCOUNTER — Encounter (INDEPENDENT_AMBULATORY_CARE_PROVIDER_SITE_OTHER): Payer: Self-pay | Admitting: Physical Medicine and Rehabilitation

## 2016-07-06 VITALS — BP 121/70 | HR 65

## 2016-07-06 DIAGNOSIS — G8929 Other chronic pain: Secondary | ICD-10-CM

## 2016-07-06 DIAGNOSIS — M25551 Pain in right hip: Secondary | ICD-10-CM

## 2016-07-06 DIAGNOSIS — M7061 Trochanteric bursitis, right hip: Secondary | ICD-10-CM | POA: Diagnosis not present

## 2016-07-06 DIAGNOSIS — M7611 Psoas tendinitis, right hip: Secondary | ICD-10-CM

## 2016-07-06 DIAGNOSIS — M545 Low back pain, unspecified: Secondary | ICD-10-CM

## 2016-07-06 NOTE — Progress Notes (Signed)
Pain in inner right thigh- same place as she was having pain before her hip replacement. Pain in lower back at night- difficulty sleeping in bed and on either side. No numbness or tingling. Right leg pain increases when exercising. Also has right knee pain.

## 2016-07-07 ENCOUNTER — Encounter (INDEPENDENT_AMBULATORY_CARE_PROVIDER_SITE_OTHER): Payer: Self-pay | Admitting: Physical Medicine and Rehabilitation

## 2016-07-07 DIAGNOSIS — M25551 Pain in right hip: Secondary | ICD-10-CM

## 2016-07-07 DIAGNOSIS — M7061 Trochanteric bursitis, right hip: Secondary | ICD-10-CM

## 2016-07-07 MED ORDER — BUPIVACAINE HCL 0.25 % IJ SOLN
4.0000 mL | INTRAMUSCULAR | Status: AC | PRN
  Administered 2016-07-07: 4 mL via INTRA_ARTICULAR

## 2016-07-07 MED ORDER — TRIAMCINOLONE ACETONIDE 40 MG/ML IJ SUSP
40.0000 mg | INTRAMUSCULAR | Status: AC | PRN
  Administered 2016-07-07: 40 mg via INTRA_ARTICULAR

## 2016-07-07 MED ORDER — LIDOCAINE HCL 2 % IJ SOLN
4.0000 mL | INTRAMUSCULAR | Status: AC | PRN
  Administered 2016-07-07: 4 mL
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
## 2016-07-14 ENCOUNTER — Encounter (INDEPENDENT_AMBULATORY_CARE_PROVIDER_SITE_OTHER): Payer: Self-pay | Admitting: Physical Medicine and Rehabilitation

## 2016-07-14 ENCOUNTER — Ambulatory Visit (INDEPENDENT_AMBULATORY_CARE_PROVIDER_SITE_OTHER): Payer: BLUE CROSS/BLUE SHIELD | Admitting: Physical Medicine and Rehabilitation

## 2016-07-14 ENCOUNTER — Ambulatory Visit (INDEPENDENT_AMBULATORY_CARE_PROVIDER_SITE_OTHER): Payer: BLUE CROSS/BLUE SHIELD

## 2016-07-14 VITALS — BP 125/70

## 2016-07-14 DIAGNOSIS — M79651 Pain in right thigh: Secondary | ICD-10-CM | POA: Diagnosis not present

## 2016-07-14 DIAGNOSIS — M25551 Pain in right hip: Secondary | ICD-10-CM | POA: Diagnosis not present

## 2016-07-14 DIAGNOSIS — M7071 Other bursitis of hip, right hip: Secondary | ICD-10-CM | POA: Diagnosis not present
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
## 2016-07-14 NOTE — Progress Notes (Signed)
Patient is here today for planned right psoas injection. No change in symptoms.

## 2016-07-14 NOTE — Patient Instructions (Signed)

## 2016-07-15 MED ORDER — TRIAMCINOLONE ACETONIDE 40 MG/ML IJ SUSP
80.0000 mg | INTRAMUSCULAR | Status: AC | PRN
  Administered 2016-07-14: 80 mg via INTRA_ARTICULAR

## 2016-07-15 MED ORDER — BUPIVACAINE HCL 0.5 % IJ SOLN
3.0000 mL | INTRAMUSCULAR | Status: AC | PRN
  Administered 2016-07-14: 3 mL via INTRA_ARTICULAR

## 2016-07-27 ENCOUNTER — Telehealth (INDEPENDENT_AMBULATORY_CARE_PROVIDER_SITE_OTHER): Payer: Self-pay

## 2016-07-27 NOTE — Telephone Encounter (Signed)
Called pt to check on her after her appt yesterday. Pts leg was very numb after the injection and when she went to stand to leave she fell in the floor after taking a few steps. Courtney, Dr. Ernestina Patches, and myself helped her up and she waited in the office in the recliner for over and hour and her husband came to pick her up. Leg was some better but still numb enough that she did not need to be driving. We transported her out in a wheel chair and helped her into the car. When I spoke with her this morning she was very appreciative of the help we gave her yesterday. She says that leg continued to be numb until around 7:30 last night and that she waited on her porch in a patio chair until her son and his friend could come over and help her into the house. Says leg is much better today and I advised her to call us back if she had any other questions are concerns.

## 2016-08-01 ENCOUNTER — Telehealth (INDEPENDENT_AMBULATORY_CARE_PROVIDER_SITE_OTHER): Payer: Self-pay

## 2016-08-01 NOTE — Telephone Encounter (Signed)
Pt left vm letting us know that she has done really well since last injection.

## 2016-08-23 ENCOUNTER — Ambulatory Visit (INDEPENDENT_AMBULATORY_CARE_PROVIDER_SITE_OTHER): Payer: BLUE CROSS/BLUE SHIELD | Admitting: Orthopaedic Surgery

## 2016-08-31 ENCOUNTER — Ambulatory Visit (INDEPENDENT_AMBULATORY_CARE_PROVIDER_SITE_OTHER): Payer: BLUE CROSS/BLUE SHIELD | Admitting: Orthopaedic Surgery

## 2016-08-31 DIAGNOSIS — M25561 Pain in right knee: Secondary | ICD-10-CM

## 2016-08-31 DIAGNOSIS — G8929 Other chronic pain: Secondary | ICD-10-CM | POA: Diagnosis not present

## 2016-08-31 MED ORDER — LIDOCAINE HCL 1 % IJ SOLN
3.0000 mL | INTRAMUSCULAR | Status: AC | PRN
  Administered 2016-08-31: 3 mL

## 2016-08-31 MED ORDER — METHYLPREDNISOLONE ACETATE 40 MG/ML IJ SUSP
40.0000 mg | INTRAMUSCULAR | Status: AC | PRN
  Administered 2016-08-31: 40 mg via INTRA_ARTICULAR
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
## 2016-09-12 ENCOUNTER — Other Ambulatory Visit (HOSPITAL_COMMUNITY): Payer: Self-pay | Admitting: Family Medicine

## 2016-09-12 DIAGNOSIS — R911 Solitary pulmonary nodule: Secondary | ICD-10-CM

## 2016-09-19 ENCOUNTER — Telehealth (INDEPENDENT_AMBULATORY_CARE_PROVIDER_SITE_OTHER): Payer: Self-pay

## 2016-09-19 NOTE — Telephone Encounter (Signed)
Pt left vm requesting another hip injection. Had one on 07/14/16 and says she had great relief with it for over 8 weeks. Ok to repeat?

## 2016-09-20 ENCOUNTER — Ambulatory Visit (HOSPITAL_COMMUNITY)
Admission: RE | Admit: 2016-09-20 | Discharge: 2016-09-20 | Disposition: A | Discharge status: Home / Self Care | Payer: BLUE CROSS/BLUE SHIELD | Source: Ambulatory Visit | Attending: Family Medicine | Admitting: Family Medicine

## 2016-09-20 DIAGNOSIS — R911 Solitary pulmonary nodule: Secondary | ICD-10-CM | POA: Diagnosis present

## 2016-09-20 LAB — POCT I-STAT CREATININE: CREATININE: 0.6 mg/dL (ref 0.44–1.00)

## 2016-09-20 MED ORDER — IOPAMIDOL (ISOVUE-370) INJECTION 76%
100.0000 mL | Freq: Once | INTRAVENOUS | Status: AC | PRN
  Administered 2016-09-20: 75 mL via INTRAVENOUS

## 2016-09-20 MED ORDER — IOPAMIDOL (ISOVUE-370) INJECTION 76%
INTRAVENOUS | Status: AC
  Filled 2016-09-20: qty 100

## 2016-09-20 NOTE — Telephone Encounter (Signed)
Scheduled pt for 09/26/16 @ 2:30

## 2016-09-20 NOTE — Telephone Encounter (Signed)
Ok, but we will not get her as numb as before and will need to discuss day of

## 2016-09-26 ENCOUNTER — Ambulatory Visit (INDEPENDENT_AMBULATORY_CARE_PROVIDER_SITE_OTHER): Payer: BLUE CROSS/BLUE SHIELD

## 2016-09-26 ENCOUNTER — Ambulatory Visit (INDEPENDENT_AMBULATORY_CARE_PROVIDER_SITE_OTHER): Payer: BLUE CROSS/BLUE SHIELD | Admitting: Physical Medicine and Rehabilitation

## 2016-09-26 DIAGNOSIS — M7071 Other bursitis of hip, right hip: Secondary | ICD-10-CM | POA: Diagnosis not present

## 2016-09-26 DIAGNOSIS — M25551 Pain in right hip: Secondary | ICD-10-CM

## 2016-09-26 DIAGNOSIS — M79651 Pain in right thigh: Secondary | ICD-10-CM
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
## 2016-09-26 NOTE — Progress Notes (Unsigned)
Fluoro Time: 22 sec Mgy:[id number]

## 2016-09-26 NOTE — Progress Notes (Deleted)
Patient states she did well with the last injection for around 11 weeks. Was able to walk a lot better. Increased pain x 3 weeks. Radiating down inside of leg to the ankle.

## 2016-09-27 MED ORDER — TRIAMCINOLONE ACETONIDE 40 MG/ML IJ SUSP
80.0000 mg | INTRAMUSCULAR | Status: AC | PRN
  Administered 2016-09-26: 80 mg via INTRA_ARTICULAR

## 2016-09-27 MED ORDER — BUPIVACAINE HCL 0.25 % IJ SOLN
2.0000 mL | INTRAMUSCULAR | Status: AC | PRN
  Administered 2016-09-26: 2 mL via INTRA_ARTICULAR

## 2016-09-27 NOTE — Patient Instructions (Signed)

## 2016-09-27 NOTE — Progress Notes (Deleted)
Patient states she did well with the last injection for around 11 weeks. Was able to walk a lot better. Increased pain x 3 weeks. Radiating down inside of leg to the ankle.

## 2016-09-29 ENCOUNTER — Telehealth (INDEPENDENT_AMBULATORY_CARE_PROVIDER_SITE_OTHER): Payer: Self-pay

## 2016-09-29 NOTE — Telephone Encounter (Signed)
To early to tell but may be that the full femoral nerve block helped. Would give until Monday. I need to run by [employer]. There might be medication options for nerve pain as well, I think she may have tried before for her back.

## 2016-09-29 NOTE — Telephone Encounter (Signed)
Pt left vm stating she has had no relief with hip inj from Monday. Wants to know if you think its too early to tell or if there is something else you can do or recommend?

## 2016-09-29 NOTE — Telephone Encounter (Signed)
Advised pt and she will call us back on Monday with an update

## 2016-09-30 ENCOUNTER — Telehealth (INDEPENDENT_AMBULATORY_CARE_PROVIDER_SITE_OTHER): Payer: Self-pay | Admitting: Radiology

## 2016-09-30 NOTE — Telephone Encounter (Signed)
error 

## 2016-10-04 ENCOUNTER — Telehealth (INDEPENDENT_AMBULATORY_CARE_PROVIDER_SITE_OTHER): Payer: Self-pay | Admitting: Radiology

## 2016-10-04 NOTE — Telephone Encounter (Signed)
Erica Richardson stating that her leg is NOT any better and she said she will wait for Dr. Ernestina Patches to talk with Dr. Ninfa Linden.  She states that she would like a call back.

## 2016-10-05 NOTE — Telephone Encounter (Signed)
Pt scheduled for 10/17/16 @ 10:45 with Dr. Ninfa Linden

## 2016-10-05 NOTE — Telephone Encounter (Signed)
Left message with husband for pt to call me back. °

## 2016-10-05 NOTE — Telephone Encounter (Signed)
I discussed with Dr. Ninfa Linden and he wants to see her for evaluation.

## 2016-10-12 ENCOUNTER — Telehealth (INDEPENDENT_AMBULATORY_CARE_PROVIDER_SITE_OTHER): Payer: Self-pay | Admitting: Physical Medicine and Rehabilitation

## 2016-10-12 NOTE — Telephone Encounter (Signed)
I would still keep the appointment with Ninfa Linden

## 2016-10-17 ENCOUNTER — Ambulatory Visit (INDEPENDENT_AMBULATORY_CARE_PROVIDER_SITE_OTHER): Payer: BLUE CROSS/BLUE SHIELD | Admitting: Orthopaedic Surgery

## 2016-10-17 ENCOUNTER — Encounter (INDEPENDENT_AMBULATORY_CARE_PROVIDER_SITE_OTHER): Payer: Self-pay | Admitting: Orthopaedic Surgery

## 2016-10-17 DIAGNOSIS — Z96641 Presence of right artificial hip joint: Secondary | ICD-10-CM | POA: Diagnosis not present

## 2016-10-17 MED ORDER — PREGABALIN 75 MG PO CAPS: 75.0000 mg | ORAL_CAPSULE | Freq: Two times a day (BID) | ORAL | 0 refills | Status: DC

## 2016-10-17 MED ORDER — MELOXICAM 15 MG PO TABS: 15.0000 mg | ORAL_TABLET | Freq: Every day | ORAL | 3 refills | Status: DC

## 2016-10-17 NOTE — Progress Notes (Signed)
The patient is over a year out from a right total hip arthroplasty. She is at least had 2 injections around the femoral nerve by Dr. Ernestina Patches due to radicular symptoms running from her femoral nerve area down to her ankle. One of the injections made her pain-free for 11 weeks. She still has that same pain but not specifically in the groin. She also hurts over trochanteric area on both sides. She's significantly better than she was before surgery and does feel like things are slowly getting better. She is already tried and failed oral steroids. She is also been on Neurontin and that makes her sleepy but she's tried a prolonged period time. She denies a numbness in her foot.  On examination of her right hip I can move fluidly with no difficult blocks rotation at all. Her exam is basically normal today other than the radiating pain down her leg with no numbness and tingling.  At this point I would like to try meloxicam 15 mg  once daily and switch her try Lyrica 75 mg twice a day. I'll see her back in 4 weeks see how she doing overall. At that visit I would like a low AP pelvis and a lateral of her right hip.

## 2016-10-20 ENCOUNTER — Telehealth (INDEPENDENT_AMBULATORY_CARE_PROVIDER_SITE_OTHER): Payer: Self-pay | Admitting: Orthopaedic Surgery

## 2016-10-20 NOTE — Telephone Encounter (Signed)
Patient called advised the insurance company need prior approval before they will fill the Rx Erica Richardson) The number to contact patient is [phone]

## 2016-10-21 ENCOUNTER — Telehealth (INDEPENDENT_AMBULATORY_CARE_PROVIDER_SITE_OTHER): Payer: Self-pay | Admitting: Orthopaedic Surgery

## 2016-10-21 NOTE — Telephone Encounter (Signed)
I submitted online at covermymeds, IC patient and advised it is pending.

## 2016-10-21 NOTE — Telephone Encounter (Signed)
Can you still do this on cover my meds? If not I can do it later

## 2016-10-21 NOTE — Telephone Encounter (Signed)
Patient called asked if she can get an approval for her Rx  for Lyrica today. The number to contact patient is [phone]

## 2016-10-21 NOTE — Telephone Encounter (Signed)
Patient had called and Mrs. Joycelyn Schmid talked to [place] about PA for patient.  Abigail Butts advised Mrs. Joycelyn Schmid that she would take care of PA for patient.

## 2016-10-27 ENCOUNTER — Other Ambulatory Visit (INDEPENDENT_AMBULATORY_CARE_PROVIDER_SITE_OTHER): Payer: Self-pay

## 2016-10-27 ENCOUNTER — Telehealth (INDEPENDENT_AMBULATORY_CARE_PROVIDER_SITE_OTHER): Payer: Self-pay | Admitting: Orthopaedic Surgery

## 2016-10-27 MED ORDER — DULOXETINE HCL 30 MG PO CPEP: ORAL_CAPSULE | ORAL | 1 refills | Status: DC

## 2016-10-27 NOTE — Telephone Encounter (Signed)
Patient aware I sent this to her pharmacy

## 2016-10-27 NOTE — Telephone Encounter (Signed)
Her Lyrica was denied, anything else she can take instead?

## 2016-10-27 NOTE — Telephone Encounter (Signed)
I know with another patient we recently had and denied, they wanted her to try Cymbalta

## 2016-10-27 NOTE — Telephone Encounter (Signed)
She has already tried Neurontin and failed that.  Don't know of anything else.

## 2016-10-27 NOTE — Telephone Encounter (Signed)
Patient had a question about her surgery tomorrow with Dr. Ninfa Linden. CB # [phone]

## 2016-10-27 NOTE — Telephone Encounter (Signed)
Try Cymbalta 30 mg daily for one week then increase to 60 mg daily, #60

## 2016-11-14 ENCOUNTER — Ambulatory Visit (INDEPENDENT_AMBULATORY_CARE_PROVIDER_SITE_OTHER): Payer: BLUE CROSS/BLUE SHIELD | Admitting: Orthopaedic Surgery

## 2017-01-01 ENCOUNTER — Other Ambulatory Visit (INDEPENDENT_AMBULATORY_CARE_PROVIDER_SITE_OTHER): Payer: Self-pay | Admitting: Orthopaedic Surgery

## 2017-01-02 NOTE — Telephone Encounter (Signed)
Please advise 

## 2017-01-31 ENCOUNTER — Ambulatory Visit (INDEPENDENT_AMBULATORY_CARE_PROVIDER_SITE_OTHER): Payer: BLUE CROSS/BLUE SHIELD | Admitting: Orthopaedic Surgery

## 2017-05-22 ENCOUNTER — Encounter (INDEPENDENT_AMBULATORY_CARE_PROVIDER_SITE_OTHER): Payer: Self-pay | Admitting: Orthopaedic Surgery

## 2017-05-22 ENCOUNTER — Ambulatory Visit (INDEPENDENT_AMBULATORY_CARE_PROVIDER_SITE_OTHER): Payer: BLUE CROSS/BLUE SHIELD

## 2017-05-22 ENCOUNTER — Ambulatory Visit (INDEPENDENT_AMBULATORY_CARE_PROVIDER_SITE_OTHER): Payer: BLUE CROSS/BLUE SHIELD | Admitting: Orthopaedic Surgery

## 2017-05-22 DIAGNOSIS — M25561 Pain in right knee: Secondary | ICD-10-CM

## 2017-05-22 DIAGNOSIS — Z96641 Presence of right artificial hip joint: Secondary | ICD-10-CM

## 2017-05-22 DIAGNOSIS — G8929 Other chronic pain: Secondary | ICD-10-CM

## 2017-05-22 MED ORDER — LIDOCAINE HCL 1 % IJ SOLN
3.0000 mL | INTRAMUSCULAR | Status: AC | PRN
  Administered 2017-05-22: 3 mL

## 2017-05-22 MED ORDER — METHYLPREDNISOLONE ACETATE 40 MG/ML IJ SUSP
40.0000 mg | INTRAMUSCULAR | Status: AC | PRN
  Administered 2017-05-22: 40 mg via INTRA_ARTICULAR
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
## 2017-09-01 ENCOUNTER — Other Ambulatory Visit (HOSPITAL_BASED_OUTPATIENT_CLINIC_OR_DEPARTMENT_OTHER): Payer: Self-pay | Admitting: Family Medicine

## 2017-09-01 DIAGNOSIS — R1013 Epigastric pain: Secondary | ICD-10-CM

## 2017-09-02 ENCOUNTER — Ambulatory Visit (HOSPITAL_BASED_OUTPATIENT_CLINIC_OR_DEPARTMENT_OTHER)
Admission: RE | Admit: 2017-09-02 | Discharge: 2017-09-02 | Disposition: A | Discharge status: Home / Self Care | Payer: BLUE CROSS/BLUE SHIELD | Source: Ambulatory Visit | Attending: Family Medicine | Admitting: Family Medicine

## 2017-09-02 DIAGNOSIS — R1013 Epigastric pain: Secondary | ICD-10-CM

## 2017-09-04 ENCOUNTER — Other Ambulatory Visit (HOSPITAL_COMMUNITY): Payer: Self-pay | Admitting: Family Medicine

## 2017-09-04 DIAGNOSIS — R1013 Epigastric pain: Secondary | ICD-10-CM

## 2017-09-05 ENCOUNTER — Other Ambulatory Visit: Payer: Self-pay | Admitting: Family Medicine

## 2017-09-05 DIAGNOSIS — R1013 Epigastric pain: Secondary | ICD-10-CM

## 2017-09-14 ENCOUNTER — Ambulatory Visit (HOSPITAL_COMMUNITY)
Admission: RE | Admit: 2017-09-14 | Discharge: 2017-09-14 | Disposition: A | Discharge status: Home / Self Care | Payer: BLUE CROSS/BLUE SHIELD | Source: Ambulatory Visit | Attending: Family Medicine | Admitting: Family Medicine

## 2017-09-14 DIAGNOSIS — R1013 Epigastric pain: Secondary | ICD-10-CM

## 2017-09-14 MED ORDER — TECHNETIUM TC 99M MEBROFENIN IV KIT
4.5000 | PACK | Freq: Once | INTRAVENOUS | Status: AC
  Administered 2017-09-14: 4.5 via INTRAVENOUS

## 2017-11-08 ENCOUNTER — Encounter (INDEPENDENT_AMBULATORY_CARE_PROVIDER_SITE_OTHER): Payer: Self-pay | Admitting: Physician Assistant

## 2017-11-08 ENCOUNTER — Ambulatory Visit (INDEPENDENT_AMBULATORY_CARE_PROVIDER_SITE_OTHER): Payer: Medicare Other

## 2017-11-08 ENCOUNTER — Ambulatory Visit (INDEPENDENT_AMBULATORY_CARE_PROVIDER_SITE_OTHER): Payer: Medicare Other | Admitting: Physician Assistant

## 2017-11-08 DIAGNOSIS — M5442 Lumbago with sciatica, left side: Secondary | ICD-10-CM

## 2017-11-08 MED ORDER — METHYLPREDNISOLONE 4 MG PO TABS: ORAL_TABLET | ORAL | 0 refills | Status: DC
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
## 2017-11-09 DIAGNOSIS — Z23 Encounter for immunization: Secondary | ICD-10-CM | POA: Diagnosis not present

## 2017-11-29 ENCOUNTER — Ambulatory Visit (INDEPENDENT_AMBULATORY_CARE_PROVIDER_SITE_OTHER): Payer: Medicare Other | Admitting: Physician Assistant

## 2017-12-04 ENCOUNTER — Telehealth (INDEPENDENT_AMBULATORY_CARE_PROVIDER_SITE_OTHER): Payer: Self-pay | Admitting: Orthopaedic Surgery

## 2017-12-04 NOTE — Telephone Encounter (Signed)
Returned call to patient left message to call back. 

## 2017-12-06 ENCOUNTER — Other Ambulatory Visit (INDEPENDENT_AMBULATORY_CARE_PROVIDER_SITE_OTHER): Payer: Self-pay

## 2017-12-06 ENCOUNTER — Encounter (INDEPENDENT_AMBULATORY_CARE_PROVIDER_SITE_OTHER): Payer: Self-pay | Admitting: Physician Assistant

## 2017-12-06 ENCOUNTER — Ambulatory Visit (INDEPENDENT_AMBULATORY_CARE_PROVIDER_SITE_OTHER): Payer: Medicare Other | Admitting: Physician Assistant

## 2017-12-06 VITALS — Ht 64.0 in | Wt 155.0 lb

## 2017-12-06 DIAGNOSIS — M4807 Spinal stenosis, lumbosacral region: Secondary | ICD-10-CM

## 2017-12-06 DIAGNOSIS — M5442 Lumbago with sciatica, left side: Secondary | ICD-10-CM | POA: Diagnosis not present

## 2017-12-06 MED ORDER — CYCLOBENZAPRINE HCL 10 MG PO TABS: 10.0000 mg | ORAL_TABLET | Freq: Three times a day (TID) | ORAL | 0 refills | Status: DC

## 2017-12-06 MED ORDER — GABAPENTIN 300 MG PO CAPS: 300.0000 mg | ORAL_CAPSULE | Freq: Every day | ORAL | 0 refills | Status: DC

## 2017-12-06 MED ORDER — GABAPENTIN 100 MG PO CAPS: ORAL_CAPSULE | ORAL | 0 refills | Status: DC

## 2017-12-06 NOTE — Progress Notes (Signed)
HPI: Ms. [name] returns today follow-up of her low back pain.  She is on a Medrol Dosepak he felt good for about 2 weeks after taking it.  Her back pain however is becoming worse now.  She states she cannot sleep.  She is having pain down into her right upper thigh.  Said no bowel bladder dysfunction.  No fevers chills.  She is taken Tylenol without any relief.  She did not take any muscle relaxers due to the fact that makes her hyper she is wondering if she can take these during the day.  She is unable take NSAIDs due to GI upset.  She has tried some home exercises without any real relief.  Review of systems: See HPI  Physical exam: General well-developed well-nourished female no acute distress mood affect appropriate Lower extremities: 5 out of 5 strength throughout the lower extremities against resistance.  Negative straight leg raise bilaterally.  Tenderness with palpation over the left lower paraspinous region.  Impression low back pain with radicular symptoms left lower extremity  Plan: Due to the fact the patient is failed conservative treatment recommend MRI to rule out HNP as a source of her pain also for epidural steroid planning.  She is able to take Flexeril 10 mg and can take this during the day up to 3 times daily.  We will also place her on some Neurontin 100 mg 1 nightly x5 days then 200 mg nightly x1 week and then 300 mg nightly.  We will see her back after the MRI to go over results and discuss further treatment.

## 2017-12-13 ENCOUNTER — Telehealth (INDEPENDENT_AMBULATORY_CARE_PROVIDER_SITE_OTHER): Payer: Self-pay | Admitting: Physician Assistant

## 2017-12-13 DIAGNOSIS — G4700 Insomnia, unspecified: Secondary | ICD-10-CM | POA: Diagnosis not present

## 2017-12-13 DIAGNOSIS — J449 Chronic obstructive pulmonary disease, unspecified: Secondary | ICD-10-CM | POA: Diagnosis not present

## 2017-12-13 DIAGNOSIS — F338 Other recurrent depressive disorders: Secondary | ICD-10-CM | POA: Diagnosis not present

## 2017-12-13 DIAGNOSIS — Z23 Encounter for immunization: Secondary | ICD-10-CM | POA: Diagnosis not present

## 2017-12-13 DIAGNOSIS — E785 Hyperlipidemia, unspecified: Secondary | ICD-10-CM | POA: Diagnosis not present

## 2017-12-13 DIAGNOSIS — K210 Gastro-esophageal reflux disease with esophagitis: Secondary | ICD-10-CM | POA: Diagnosis not present

## 2017-12-13 DIAGNOSIS — M810 Age-related osteoporosis without current pathological fracture: Secondary | ICD-10-CM | POA: Diagnosis not present

## 2017-12-13 DIAGNOSIS — Z79899 Other long term (current) drug therapy: Secondary | ICD-10-CM | POA: Diagnosis not present

## 2017-12-13 DIAGNOSIS — E538 Deficiency of other specified B group vitamins: Secondary | ICD-10-CM | POA: Diagnosis not present

## 2017-12-13 DIAGNOSIS — Z0000 Encounter for general adult medical examination without abnormal findings: Secondary | ICD-10-CM | POA: Diagnosis not present

## 2017-12-13 DIAGNOSIS — G2581 Restless legs syndrome: Secondary | ICD-10-CM | POA: Diagnosis not present

## 2017-12-13 NOTE — Telephone Encounter (Signed)
Patient is wondering if an MRI of her right knee can be added to the order of her back MRI that's scheduled for Friday. She said Dr. Ninfa Linden has seen her for this problem before and said it would eventually need an MRI. She would rather have them done at the same time.MRI of her back is currently scheduled for 10/25. Please advise # [phone]

## 2017-12-13 NOTE — Telephone Encounter (Signed)
I am fine with her having an MRI of her knee but this may be difficult to schedule at this last-minute given the fact that an MRI of the lumbar spine is definitely positioned in the scan with an MRI of her knee.  Let her know that but certainly if they can do an MRI of her knee in the same setting that would be fine with me.

## 2017-12-14 ENCOUNTER — Other Ambulatory Visit (INDEPENDENT_AMBULATORY_CARE_PROVIDER_SITE_OTHER): Payer: Self-pay

## 2017-12-14 DIAGNOSIS — M25561 Pain in right knee: Principal | ICD-10-CM

## 2017-12-14 DIAGNOSIS — G8929 Other chronic pain: Secondary | ICD-10-CM

## 2017-12-14 NOTE — Telephone Encounter (Signed)
Is there anyway to get her knee done at the same time as her already scheduled back? I already told her this may not be possible

## 2017-12-15 ENCOUNTER — Ambulatory Visit
Admission: RE | Admit: 2017-12-15 | Discharge: 2017-12-15 | Disposition: A | Discharge status: Home / Self Care | Payer: Medicare Other | Source: Ambulatory Visit | Attending: Physician Assistant | Admitting: Physician Assistant

## 2017-12-15 DIAGNOSIS — M4807 Spinal stenosis, lumbosacral region: Secondary | ICD-10-CM

## 2017-12-15 DIAGNOSIS — M47819 Spondylosis without myelopathy or radiculopathy, site unspecified: Secondary | ICD-10-CM | POA: Diagnosis not present

## 2017-12-15 NOTE — Telephone Encounter (Signed)
This is not possible, pt is scheduled for 12/15/17 for lumbar and 12/19/17 for Knee

## 2017-12-18 ENCOUNTER — Other Ambulatory Visit: Payer: Self-pay | Admitting: Acute Care

## 2017-12-18 ENCOUNTER — Ambulatory Visit (INDEPENDENT_AMBULATORY_CARE_PROVIDER_SITE_OTHER): Payer: Medicare Other | Admitting: Physician Assistant

## 2017-12-18 ENCOUNTER — Encounter (INDEPENDENT_AMBULATORY_CARE_PROVIDER_SITE_OTHER): Payer: Self-pay | Admitting: Physician Assistant

## 2017-12-18 VITALS — Ht 64.0 in | Wt 155.0 lb

## 2017-12-18 DIAGNOSIS — G8929 Other chronic pain: Secondary | ICD-10-CM | POA: Diagnosis not present

## 2017-12-18 DIAGNOSIS — Z122 Encounter for screening for malignant neoplasm of respiratory organs: Secondary | ICD-10-CM

## 2017-12-18 DIAGNOSIS — Z87891 Personal history of nicotine dependence: Secondary | ICD-10-CM

## 2017-12-18 DIAGNOSIS — M5442 Lumbago with sciatica, left side: Secondary | ICD-10-CM | POA: Diagnosis not present

## 2017-12-18 DIAGNOSIS — F17210 Nicotine dependence, cigarettes, uncomplicated: Secondary | ICD-10-CM

## 2017-12-18 NOTE — Progress Notes (Signed)
HPI: [name] returns today to go over the MRI of her lumbar spine.  She is been on gabapentin and Robaxin and feels is giving her some relief.  She still has pain into the left thigh and hip.  She has worsening pain with prolonged standing.  No numbness or tingling down either leg.  Pain down the left side of the knee does not go down in the lower leg.  She states if she bends forward that some of her pain is relieved. MRI of the lumbar spine showed bilateral facet hypertrophy at L4-5 and bilateral facet hypertrophy at L3-4.  No foraminal or canal stenosis at either level.  Also seen is tethering the spinal cord conus tip at L5 5.  Cleft in the conus at L3.  MRI results were reviewed with the patient.  Images were reviewed along with a spinal model was used for visualization purposes.  Review of systems: Please see HPI otherwise negative  Physical exam: Negative straight leg raise.  5 out of 5 strength throughout lower extremities.  Tenderness over the trochanteric region bilaterally.  Impression: Low back pain with facet hypertrophy and left thigh pain  Plan we will send her for facet injection on the left with Dr. Ernestina Richardson.  Follow-up in 4 weeks check her progress lack of.

## 2017-12-18 NOTE — Progress Notes (Signed)
ch

## 2017-12-19 ENCOUNTER — Ambulatory Visit
Admission: RE | Admit: 2017-12-19 | Discharge: 2017-12-19 | Disposition: A | Discharge status: Home / Self Care | Payer: Medicare Other | Source: Ambulatory Visit | Attending: Orthopaedic Surgery | Admitting: Orthopaedic Surgery

## 2017-12-19 DIAGNOSIS — G8929 Other chronic pain: Secondary | ICD-10-CM

## 2017-12-19 DIAGNOSIS — M25561 Pain in right knee: Secondary | ICD-10-CM | POA: Diagnosis not present

## 2017-12-22 ENCOUNTER — Telehealth (INDEPENDENT_AMBULATORY_CARE_PROVIDER_SITE_OTHER): Payer: Self-pay | Admitting: Physician Assistant

## 2017-12-22 NOTE — Telephone Encounter (Signed)
Noted  

## 2017-12-22 NOTE — Telephone Encounter (Signed)
Can you get patient approved for gel injection?  Artis Delay patient

## 2017-12-22 NOTE — Telephone Encounter (Signed)
Can you please advise? Thanks.

## 2017-12-22 NOTE — Telephone Encounter (Signed)
Order her a visc injection for that knee I called her with the results .

## 2017-12-22 NOTE — Telephone Encounter (Signed)
Patient had MRI 10/29, she is wondering if she could be called with MRI results since Gil's next available is 11/18. Please advise # [phone]

## 2017-12-25 ENCOUNTER — Ambulatory Visit (INDEPENDENT_AMBULATORY_CARE_PROVIDER_SITE_OTHER)
Admission: RE | Admit: 2017-12-25 | Discharge: 2017-12-25 | Disposition: A | Discharge status: Home / Self Care | Payer: Medicare Other | Source: Ambulatory Visit | Attending: Acute Care | Admitting: Acute Care

## 2017-12-25 ENCOUNTER — Encounter: Payer: Self-pay | Admitting: Acute Care

## 2017-12-25 ENCOUNTER — Ambulatory Visit (INDEPENDENT_AMBULATORY_CARE_PROVIDER_SITE_OTHER): Payer: Medicare Other | Admitting: Acute Care

## 2017-12-25 DIAGNOSIS — F17210 Nicotine dependence, cigarettes, uncomplicated: Secondary | ICD-10-CM

## 2017-12-25 DIAGNOSIS — Z87891 Personal history of nicotine dependence: Secondary | ICD-10-CM | POA: Diagnosis not present

## 2017-12-25 DIAGNOSIS — Z122 Encounter for screening for malignant neoplasm of respiratory organs: Secondary | ICD-10-CM

## 2017-12-25 NOTE — Progress Notes (Signed)
Shared Decision Making Visit Lung Cancer Screening Program [phone])   Eligibility:  Age 67 y.o.  Pack Years Smoking History Calculation 36 pack year smoking history (# packs/per year x # years smoked)  Recent History of coughing up blood  no  Unexplained weight loss? no ( >Than 15 pounds within the last 6 months )  Prior History Lung / other cancer no (Diagnosis within the last 5 years already requiring surveillance chest CT Scans).  Smoking Status Current Smoker  Former Smokers: Years since quit: NA  Quit Date: na  Visit Components:  Discussion included one or more decision making aids. yes  Discussion included risk/benefits of screening. yes  Discussion included potential follow up diagnostic testing for abnormal scans. yes  Discussion included meaning and risk of over diagnosis. yes  Discussion included meaning and risk of False Positives. yes  Discussion included meaning of total radiation exposure. yes  Counseling Included:  Importance of adherence to annual lung cancer LDCT screening. yes  Impact of comorbidities on ability to participate in the program. yes  Ability and willingness to under diagnostic treatment. yes  Smoking Cessation Counseling:  Current Smokers:   Discussed importance of smoking cessation. yes  Information about tobacco cessation classes and interventions provided to patient. yes  Patient provided with "ticket" for LDCT Scan. yes  Symptomatic Patient. no  Counseling  Diagnosis Code: Tobacco Use Z72.0  Asymptomatic Patient yes  Counseling (Intermediate counseling: > three minutes counseling) B3532  Former Smokers:   Discussed the importance of maintaining cigarette abstinence. yes  Diagnosis Code: Personal History of Nicotine Dependence. D92.426  Information about tobacco cessation classes and interventions provided to patient. Yes  Patient provided with "ticket" for LDCT Scan. yes  Written Order for Lung Cancer  Screening with LDCT placed in Epic. Yes (CT Chest Lung Cancer Screening Low Dose W/O CM) STM1962 Z12.2-Screening of respiratory organs Z87.891-Personal history of nicotine dependence  I have spent 25 minutes of face to face time with Erica Richardson discussing the risks and benefits of lung cancer screening. We viewed a power point together that explained in detail the above noted topics. We paused at intervals to allow for questions to be asked and answered to ensure understanding.We discussed that the single most powerful action that she can take to decrease her risk of developing lung cancer is to quit smoking. We discussed whether or not she is ready to commit to setting a quit date. She is not ready to set a quit date. We discussed options for tools to aid in quitting smoking including nicotine replacement therapy, non-nicotine medications, support groups, Quit Smart classes, and behavior modification. We discussed that often times setting smaller, more achievable goals, such as eliminating 1 cigarette a day for a week and then 2 cigarettes a day for a week can be helpful in slowly decreasing the number of cigarettes smoked. This allows for a sense of accomplishment as well as providing a clinical benefit. I gave her the " Be Stronger Than Your Excuses" card with contact information for community resources, classes, free nicotine replacement therapy, and access to mobile apps, text messaging, and on-line smoking cessation help. I have also given her my card and contact information in the event she needs to contact me. We discussed the time and location of the scan, and that either Doroteo Glassman RN or I will call with the results within 24-48 hours of receiving them. I have offered her  a copy of the power point we viewed  as  a resource in the event they need reinforcement of the concepts we discussed today in the office. The patient verbalized understanding of all of  the above and had no further questions upon  leaving the office. They have my contact information in the event they have any further questions.  I spent 5 minutes counseling on smoking cessation and the health risks of continued tobacco abuse.  I explained to the patient that there has been a high incidence of coronary artery disease noted on these exams. I explained that this is a non-gated exam therefore degree or severity cannot be determined. This patient is on statin therapy. I have asked the patient to follow-up with their PCP regarding any incidental finding of coronary artery disease and management with diet or medication as their PCP  feels is clinically indicated. The patient verbalized understanding of the above and had no further questions upon completion of the visit.      Erica Spatz, NP 12/25/2017 4:19 PM

## 2017-12-26 ENCOUNTER — Other Ambulatory Visit (INDEPENDENT_AMBULATORY_CARE_PROVIDER_SITE_OTHER): Payer: Self-pay | Admitting: Physician Assistant

## 2017-12-26 NOTE — Telephone Encounter (Signed)
Noted  

## 2017-12-26 NOTE — Telephone Encounter (Signed)
April, this was sent to me in error.  Needs to go to [facility]

## 2017-12-26 NOTE — Telephone Encounter (Signed)
Please advise 

## 2017-12-29 ENCOUNTER — Telehealth (INDEPENDENT_AMBULATORY_CARE_PROVIDER_SITE_OTHER): Payer: Self-pay

## 2017-12-29 NOTE — Telephone Encounter (Signed)
Submitted VOB for Monovisc, right knee. 

## 2017-12-31 ENCOUNTER — Other Ambulatory Visit (INDEPENDENT_AMBULATORY_CARE_PROVIDER_SITE_OTHER): Payer: Self-pay | Admitting: Physician Assistant

## 2018-01-01 ENCOUNTER — Other Ambulatory Visit: Payer: Self-pay | Admitting: Acute Care

## 2018-01-01 DIAGNOSIS — F17210 Nicotine dependence, cigarettes, uncomplicated: Secondary | ICD-10-CM

## 2018-01-01 DIAGNOSIS — Z87891 Personal history of nicotine dependence: Secondary | ICD-10-CM

## 2018-01-01 DIAGNOSIS — Z122 Encounter for screening for malignant neoplasm of respiratory organs: Secondary | ICD-10-CM

## 2018-01-01 NOTE — Telephone Encounter (Signed)
Please advise, and on which one to fill

## 2018-01-02 ENCOUNTER — Encounter (INDEPENDENT_AMBULATORY_CARE_PROVIDER_SITE_OTHER): Payer: Medicare Other | Admitting: Physical Medicine and Rehabilitation

## 2018-01-04 ENCOUNTER — Encounter (INDEPENDENT_AMBULATORY_CARE_PROVIDER_SITE_OTHER): Payer: Self-pay | Admitting: Physical Medicine and Rehabilitation

## 2018-01-04 ENCOUNTER — Ambulatory Visit (INDEPENDENT_AMBULATORY_CARE_PROVIDER_SITE_OTHER): Payer: Self-pay

## 2018-01-04 ENCOUNTER — Ambulatory Visit (INDEPENDENT_AMBULATORY_CARE_PROVIDER_SITE_OTHER): Payer: Medicare Other | Admitting: Physical Medicine and Rehabilitation

## 2018-01-04 VITALS — BP 123/75 | HR 80 | Temp 98.4°F

## 2018-01-04 DIAGNOSIS — G8929 Other chronic pain: Secondary | ICD-10-CM | POA: Diagnosis not present

## 2018-01-04 DIAGNOSIS — M545 Low back pain, unspecified: Secondary | ICD-10-CM

## 2018-01-04 DIAGNOSIS — M47816 Spondylosis without myelopathy or radiculopathy, lumbar region: Secondary | ICD-10-CM

## 2018-01-04 MED ORDER — METHYLPREDNISOLONE ACETATE 80 MG/ML IJ SUSP
80.0000 mg | Freq: Once | INTRAMUSCULAR | Status: AC
  Administered 2018-01-04: 80 mg

## 2018-01-04 NOTE — Progress Notes (Signed)
 .  Numeric Pain Rating Scale and Functional Assessment Average Pain 10   In the last MONTH (on 0-10 scale) has pain interfered with the following?  1. General activity like being  able to carry out your everyday physical activities such as walking, climbing stairs, carrying groceries, or moving a chair?  Rating(4)   +Driver, -BT, -Dye Allergies.

## 2018-01-04 NOTE — Patient Instructions (Signed)

## 2018-01-15 ENCOUNTER — Encounter (INDEPENDENT_AMBULATORY_CARE_PROVIDER_SITE_OTHER): Payer: Self-pay | Admitting: Physician Assistant

## 2018-01-15 ENCOUNTER — Telehealth (INDEPENDENT_AMBULATORY_CARE_PROVIDER_SITE_OTHER): Payer: Self-pay

## 2018-01-15 ENCOUNTER — Ambulatory Visit (INDEPENDENT_AMBULATORY_CARE_PROVIDER_SITE_OTHER): Payer: Medicare Other | Admitting: Physician Assistant

## 2018-01-15 DIAGNOSIS — M1711 Unilateral primary osteoarthritis, right knee: Secondary | ICD-10-CM | POA: Diagnosis not present

## 2018-01-15 MED ORDER — HYALURONAN 88 MG/4ML IX SOSY
88.0000 mg | PREFILLED_SYRINGE | INTRA_ARTICULAR | Status: AC | PRN
  Administered 2018-01-15: 88 mg via INTRA_ARTICULAR

## 2018-01-15 NOTE — Progress Notes (Signed)
   Procedure Note  Patient: Erica Richardson             Date of Birth: Jan 23, 1951           MRN: [id number]             Visit Date: 01/15/2018 HPI: [name] returns today for Monovisc injection right knee.  She has a focal full-thickness cartilage loss inferior lateral patella facet and mild medial lateral compartmental changes.  She had no new injury to the knee.  Physical exam: Right knee good range of motion.  No effusion abnormal warmth or erythema.  Procedures: Visit Diagnoses: No diagnosis found.  Large Joint Inj: R knee on 01/15/2018 3:04 PM Indications: pain Details: 22 G 1.5 in needle, superolateral approach  Arthrogram: No  Medications: 88 mg Hyaluronan 88 MG/4ML Outcome: tolerated well, no immediate complications Procedure, treatment alternatives, risks and benefits explained, specific risks discussed. Consent was given by the patient. Immediately prior to procedure a time out was called to verify the correct patient, procedure, equipment, support staff and site/side marked as required. Patient was prepped and draped in the usual sterile fashion.     Plan: She will follow-up with [name] on as-needed basis.  She understands that she can have cortisone injections in the knee every 3 months and Monovisc injections no more often than every 6 months.

## 2018-01-15 NOTE — Telephone Encounter (Signed)
Patient is approved for Monovisc, right knee. Buy & Bill Covered at 100% through her insurance. No Co-pay  No PA required  Appt. 01/15/2018 with Erskine Emery

## 2018-01-22 DIAGNOSIS — Z01419 Encounter for gynecological examination (general) (routine) without abnormal findings: Secondary | ICD-10-CM | POA: Diagnosis not present

## 2018-01-22 DIAGNOSIS — Z6826 Body mass index (BMI) 26.0-26.9, adult: Secondary | ICD-10-CM | POA: Diagnosis not present

## 2018-01-22 DIAGNOSIS — Z1389 Encounter for screening for other disorder: Secondary | ICD-10-CM | POA: Diagnosis not present

## 2018-01-22 DIAGNOSIS — F17210 Nicotine dependence, cigarettes, uncomplicated: Secondary | ICD-10-CM | POA: Diagnosis not present

## 2018-01-22 DIAGNOSIS — Z720 Tobacco use: Secondary | ICD-10-CM | POA: Diagnosis not present

## 2018-01-22 DIAGNOSIS — F419 Anxiety disorder, unspecified: Secondary | ICD-10-CM | POA: Diagnosis not present

## 2018-01-23 DIAGNOSIS — Z124 Encounter for screening for malignant neoplasm of cervix: Secondary | ICD-10-CM | POA: Diagnosis not present

## 2018-01-30 NOTE — Procedures (Signed)
Lumbar Facet Joint Intra-Articular Injection(s) with Fluoroscopic Guidance  Patient: Erica Richardson      Date of Birth: 1950/05/22 MRN: [id number] PCP: Hulan Fess, MD      Visit Date: 01/04/2018   Universal Protocol:    Date/Time: 01/04/2018  Consent Given By: the patient  Position: PRONE   Additional Comments: Vital signs were monitored before and after the procedure. Patient was prepped and draped in the usual sterile fashion. The correct patient, procedure, and site was verified.   Injection Procedure Details:  Procedure Site One Meds Administered:  Meds ordered this encounter  Medications  . methylPREDNISolone acetate (DEPO-MEDROL) injection 80 mg     Laterality: Left  Location/Site:  L3-L4 L5-S1  Needle size: 22 guage  Needle type: Spinal  Needle Placement: Articular  Findings:  -Comments: Excellent flow of contrast producing a partial arthrogram.  Procedure Details: The fluoroscope beam is vertically oriented in AP, and the inferior recess is visualized beneath the lower pole of the inferior apophyseal process, which represents the target point for needle insertion. When direct visualization is difficult the target point is located at the medial projection of the vertebral pedicle. The region overlying each aforementioned target is locally anesthetized with a 1 to 2 ml. volume of 1% Lidocaine without Epinephrine.   The spinal needle was inserted into each of the above mentioned facet joints using biplanar fluoroscopic guidance. A 0.25 to 0.5 ml. volume of Isovue-250 was injected and a partial facet joint arthrogram was obtained. A single spot film was obtained of the resulting arthrogram.    One to 1.25 ml of the steroid/anesthetic solution was then injected into each of the facet joints noted above.   Additional Comments:  The patient tolerated the procedure well Dressing: Band-Aid    Post-procedure details: Patient was observed during the  procedure. Post-procedure instructions were reviewed.  Patient left the clinic in stable condition.

## 2018-01-30 NOTE — Progress Notes (Signed)
Erica Richardson - 67 y.o. female MRN [id number]  Date of birth: 1950-12-18  Office Visit Note: Visit Date: 01/04/2018 PCP: Hulan Fess, MD Referred by: Hulan Fess, MD  Subjective: Chief Complaint  Patient presents with  . Lower Back - Pain  . Left Hip - Pain  . Left Leg - Pain   HPI:  Erica Richardson is a 67 y.o. female who comes in today For planned diagnostic facet joint blocks for worsening severe axial low back pain on the left.  I know the patient quite well and have seen her in the past along with her husband.  She has had prior total hip replacement by Dr. Ninfa Linden and continued to be followed by him for her orthopedic complaints.  MRI of the lumbar spine was completed pretty recently and shows tethered cord syndrome with incomplete posterior element fusion at L5-S1 with facet arthritis particular at L3-4 L4-5 and L5-S1.  No focal nerve compression.  She has 10 out of 10 pain on the left lower back with some referral to the hip and at times lateral leg but not past the knee.  This is been ongoing for quite some time.  She reports laying down makes his symptoms worse and not better.  Sitting up seems to help.  She is failed other conservative care and physical therapy.  She has had different medications over time without much relief.  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  I think is reasonable from an imaging standpoint really looking more at the x-rays and the MRI to complete diagnostic L3-4 and L5-S1 facet joint blocks.  Can look at radiofrequency ablation depending on how well she does with that.  ROS Otherwise per HPI.  Assessment & Plan: Visit Diagnoses:  1. Chronic bilateral low back pain without sciatica   2. Spondylosis without myelopathy or radiculopathy, lumbar region     Plan: No additional findings.   Meds & Orders:  Meds ordered this encounter  Medications  . methylPREDNISolone acetate (DEPO-MEDROL)  injection 80 mg    Orders Placed This Encounter  Procedures  . Facet Injection  . XR C-ARM NO REPORT    Follow-up: Return if symptoms worsen or fail to improve.   Procedures: No procedures performed  Lumbar Facet Joint Intra-Articular Injection(s) with Fluoroscopic Guidance  Patient: Erica Richardson      Date of Birth: 07/13/1950 MRN: [id number] PCP: Hulan Fess, MD      Visit Date: 01/04/2018   Universal Protocol:    Date/Time: 01/04/2018  Consent Given By: the patient  Position: PRONE   Additional Comments: Vital signs were monitored before and after the procedure. Patient was prepped and draped in the usual sterile fashion. The correct patient, procedure, and site was verified.   Injection Procedure Details:  Procedure Site One Meds Administered:  Meds ordered this encounter  Medications  . methylPREDNISolone acetate (DEPO-MEDROL) injection 80 mg     Laterality: Left  Location/Site:  L3-L4 L5-S1  Needle size: 22 guage  Needle type: Spinal  Needle Placement: Articular  Findings:  -Comments: Excellent flow of contrast producing a partial arthrogram.  Procedure Details: The fluoroscope beam is vertically oriented in AP, and the inferior recess is visualized beneath the lower pole of the inferior apophyseal process, which represents the target point for needle insertion. When direct visualization is difficult the target point is located at the medial projection of the vertebral pedicle. The region overlying  each aforementioned target is locally anesthetized with a 1 to 2 ml. volume of 1% Lidocaine without Epinephrine.   The spinal needle was inserted into each of the above mentioned facet joints using biplanar fluoroscopic guidance. A 0.25 to 0.5 ml. volume of Isovue-250 was injected and a partial facet joint arthrogram was obtained. A single spot film was obtained of the resulting arthrogram.    One to 1.25 ml of the steroid/anesthetic solution was then  injected into each of the facet joints noted above.   Additional Comments:  The patient tolerated the procedure well Dressing: Band-Aid    Post-procedure details: Patient was observed during the procedure. Post-procedure instructions were reviewed.  Patient left the clinic in stable condition.    Clinical History: MRI LUMBAR SPINE WITHOUT CONTRAST  TECHNIQUE: Multiplanar, multisequence MR imaging of the lumbar spine was performed. No intravenous contrast was administered.  COMPARISON:  11/08/2017 lumbar spine radiographs. 11/24/2011 CT abdomen and pelvis.  FINDINGS: Segmentation:  Standard.  Alignment:  Physiologic.  Vertebrae: No fracture, evidence of discitis, or bone lesion. Incomplete posterior fusion of the L5 and S1 segments no meningocele or dermal sinus identified.  Conus medullaris and cauda equina: Conus extends to the L5 level. The conus is cleft through the L3 level. Small terminal lipoma at the S2-3 segment level with intervening normal filum terminalis with the conus.  Paraspinal and other soft tissues: Negative.  Disc levels:  T12-L1: No significant disc displacement, foraminal stenosis, or canal stenosis.  L1-2: No significant disc displacement, foraminal stenosis, or canal stenosis.  L2-3: No significant disc displacement, foraminal stenosis, or canal stenosis.  L3-4: No significant disc displacement. Bilateral facet hypertrophy. No foraminal or canal stenosis.  L4-5: Small central protrusion with annular fissure. Bilateral facet hypertrophy. No foraminal or canal stenosis.  L5-S1: No significant disc displacement, foraminal stenosis, or canal stenosis.  IMPRESSION: 1. 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 Small caudal terminal lipoma. Incomplete posterior fusion of L5 and S1 vertebral bodies. No meningocele or dermal sinus. 2. 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 3.  Mild lumbar spondylosis. No significant foraminal or canal stenosis.   Electronically Signed   By: Kristine Garbe M.D.   On: 12/15/2017 15:45     Objective:  VS:  HT:    WT:   BMI:     BP:123/75  HR:80bpm  TEMP:98.4 F (36.9 C)(Oral)  RESP:  Physical Exam  Ortho Exam Imaging: No results found.

## 2018-02-05 ENCOUNTER — Other Ambulatory Visit (INDEPENDENT_AMBULATORY_CARE_PROVIDER_SITE_OTHER): Payer: Self-pay | Admitting: Orthopaedic Surgery

## 2018-02-05 NOTE — Telephone Encounter (Signed)
Patient aware.

## 2018-02-05 NOTE — Telephone Encounter (Signed)
Ok to refill 

## 2018-02-16 ENCOUNTER — Other Ambulatory Visit (INDEPENDENT_AMBULATORY_CARE_PROVIDER_SITE_OTHER): Payer: Self-pay | Admitting: Orthopaedic Surgery

## 2018-02-19 ENCOUNTER — Other Ambulatory Visit (INDEPENDENT_AMBULATORY_CARE_PROVIDER_SITE_OTHER): Payer: Self-pay

## 2018-02-19 MED ORDER — GABAPENTIN 300 MG PO CAPS: ORAL_CAPSULE | ORAL | 1 refills | Status: DC

## 2018-02-19 NOTE — Telephone Encounter (Signed)
Please advise 

## 2018-03-26 ENCOUNTER — Telehealth (INDEPENDENT_AMBULATORY_CARE_PROVIDER_SITE_OTHER): Payer: Self-pay | Admitting: Physical Medicine and Rehabilitation

## 2018-03-26 NOTE — Telephone Encounter (Signed)
Patient reports 100% relief from last injection. Scheduled for 2/12 for repeat. Advised to contact Dr. Ninfa Linden regarding medications.

## 2018-03-26 NOTE — Telephone Encounter (Signed)
She needs to discus medications with Dr. Ninfa Linden. As for her "back" if the injection helped more than 50% it may be worth a second block and looking at RFA. In terms of when to have another depends on how much they help and for how long.

## 2018-04-04 ENCOUNTER — Ambulatory Visit (INDEPENDENT_AMBULATORY_CARE_PROVIDER_SITE_OTHER): Payer: Self-pay

## 2018-04-04 ENCOUNTER — Encounter (INDEPENDENT_AMBULATORY_CARE_PROVIDER_SITE_OTHER): Payer: Self-pay | Admitting: Physical Medicine and Rehabilitation

## 2018-04-04 ENCOUNTER — Ambulatory Visit (INDEPENDENT_AMBULATORY_CARE_PROVIDER_SITE_OTHER): Payer: Medicare Other | Admitting: Physical Medicine and Rehabilitation

## 2018-04-04 VITALS — BP 131/71 | HR 61 | Temp 98.5°F

## 2018-04-04 DIAGNOSIS — M47816 Spondylosis without myelopathy or radiculopathy, lumbar region: Secondary | ICD-10-CM | POA: Diagnosis not present

## 2018-04-04 MED ORDER — METHYLPREDNISOLONE ACETATE 80 MG/ML IJ SUSP
80.0000 mg | Freq: Once | INTRAMUSCULAR | Status: AC
  Administered 2018-04-04: 80 mg

## 2018-04-04 NOTE — Progress Notes (Signed)
 .  Numeric Pain Rating Scale and Functional Assessment Average Pain 7   In the last MONTH (on 0-10 scale) has pain interfered with the following?  1. General activity like being  able to carry out your everyday physical activities such as walking, climbing stairs, carrying groceries, or moving a chair?  Rating(5)   +Driver, -BT, -Dye Allergies.  
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
## 2018-04-05 NOTE — Procedures (Signed)
Lumbar Diagnostic Facet Joint Nerve Block with Fluoroscopic Guidance   Patient: Erica Richardson      Date of Birth: 24-Nov-1950 MRN: [id number] PCP: Hulan Fess, MD      Visit Date: 04/04/2018   Universal Protocol:    Date/Time: 02/13/206:12 AM  Consent Given By: the patient  Position: PRONE  Additional Comments: Vital signs were monitored before and after the procedure. Patient was prepped and draped in the usual sterile fashion. The correct patient, procedure, and site was verified.   Injection Procedure Details:  Procedure Site One Meds Administered:  Meds ordered this encounter  Medications  . methylPREDNISolone acetate (DEPO-MEDROL) injection 80 mg     Laterality: Left  Location/Site:  L3-L4 L5-S1  Needle size: 22 ga.  Needle type:spinal  Needle Placement: Oblique pedical  Findings:   -Comments: There was excellent flow of contrast along the articular pillars without intravascular flow.  Procedure Details: The fluoroscope beam is vertically oriented in AP and then obliqued 15 to 20 degrees to the ipsilateral side of the desired nerve to achieve the "Scotty dog" appearance.  The skin over the target area of the junction of the superior articulating process and the transverse process (sacral ala if blocking the L5 dorsal rami) was locally anesthetized with a 1 ml volume of 1% Lidocaine without Epinephrine.  The spinal needle was inserted and advanced in a trajectory view down to the target.   After contact with periosteum and negative aspirate for blood and CSF, correct placement without intravascular or epidural spread was confirmed by injecting 0.5 ml. of Isovue-250.  A spot radiograph was obtained of this image.    Next, a 0.5 ml. volume of the injectate described above was injected. The needle was then redirected to the other facet joint nerves mentioned above if needed.  Prior to the procedure, the patient was given a Pain Diary which was completed for  baseline measurements.  After the procedure, the patient rated their pain every 30 minutes and will continue rating at this frequency for a total of 5 hours.  The patient has been asked to complete the Diary and return to [name] by mail, fax or hand delivered as soon as possible.   Additional Comments:  The patient tolerated the procedure well Dressing: Band-Aid    Post-procedure details: Patient was observed during the procedure. Post-procedure instructions were reviewed.  Patient left the clinic in stable condition.

## 2018-04-11 ENCOUNTER — Other Ambulatory Visit (INDEPENDENT_AMBULATORY_CARE_PROVIDER_SITE_OTHER): Payer: Self-pay | Admitting: Orthopaedic Surgery

## 2018-04-11 NOTE — Telephone Encounter (Signed)
Please advise 

## 2018-04-17 DIAGNOSIS — J441 Chronic obstructive pulmonary disease with (acute) exacerbation: Secondary | ICD-10-CM | POA: Diagnosis not present

## 2018-04-17 DIAGNOSIS — R062 Wheezing: Secondary | ICD-10-CM | POA: Diagnosis not present

## 2018-04-27 DIAGNOSIS — R197 Diarrhea, unspecified: Secondary | ICD-10-CM | POA: Diagnosis not present

## 2018-04-27 DIAGNOSIS — E538 Deficiency of other specified B group vitamins: Secondary | ICD-10-CM | POA: Diagnosis not present

## 2018-04-27 DIAGNOSIS — Z205 Contact with and (suspected) exposure to viral hepatitis: Secondary | ICD-10-CM | POA: Diagnosis not present

## 2018-04-27 DIAGNOSIS — R05 Cough: Secondary | ICD-10-CM | POA: Diagnosis not present

## 2018-04-27 DIAGNOSIS — J441 Chronic obstructive pulmonary disease with (acute) exacerbation: Secondary | ICD-10-CM | POA: Diagnosis not present

## 2018-04-30 DIAGNOSIS — R197 Diarrhea, unspecified: Secondary | ICD-10-CM | POA: Diagnosis not present

## 2018-06-21 DIAGNOSIS — M810 Age-related osteoporosis without current pathological fracture: Secondary | ICD-10-CM | POA: Diagnosis not present

## 2018-10-15 ENCOUNTER — Ambulatory Visit (INDEPENDENT_AMBULATORY_CARE_PROVIDER_SITE_OTHER): Payer: Medicare Other

## 2018-10-15 ENCOUNTER — Ambulatory Visit (INDEPENDENT_AMBULATORY_CARE_PROVIDER_SITE_OTHER): Payer: Medicare Other | Admitting: Physician Assistant

## 2018-10-15 ENCOUNTER — Encounter: Payer: Self-pay | Admitting: Physician Assistant

## 2018-10-15 VITALS — Ht 64.0 in | Wt 155.0 lb

## 2018-10-15 DIAGNOSIS — M25511 Pain in right shoulder: Secondary | ICD-10-CM | POA: Diagnosis not present

## 2018-10-15 DIAGNOSIS — G8929 Other chronic pain: Secondary | ICD-10-CM

## 2018-10-15 DIAGNOSIS — M7541 Impingement syndrome of right shoulder: Secondary | ICD-10-CM

## 2018-10-15 MED ORDER — METHYLPREDNISOLONE ACETATE 40 MG/ML IJ SUSP
40.0000 mg | INTRAMUSCULAR | Status: AC | PRN
  Administered 2018-10-15: 40 mg via INTRA_ARTICULAR

## 2018-10-15 MED ORDER — LIDOCAINE HCL 1 % IJ SOLN
3.0000 mL | INTRAMUSCULAR | Status: AC | PRN
  Administered 2018-10-15: 15:00:00 3 mL

## 2018-10-15 NOTE — Progress Notes (Signed)
   Procedure Note  Patient: Erica Richardson             Date of Birth: November 01, 1950           MRN: [id number]             Visit Date: 10/15/2018   HPI: Mrs. Erica Richardson well-known to our department service comes in today with right shoulder pain now for 1 month with no known injury.  No no numbness or tingling.  However she does have pain that radiates down to her right elbow and she is right-hand dominant.  Pain is worse with overhead activity.  She been taking Tylenol which helps some.  Review of systems: Please see HPI otherwise negative noncontributory.  Physical exam: Forward flexion actively and passively she lacks about 15 to 20 degrees forward flexion.  5 out of 5 strength with external and internal rotation against resistance empty can test is negative bilaterally.  Liftoff test negative on the right.  Positive impingement on the right.  Radiographs: Right shoulder 3 views: No acute fracture.  Downsloping acromium was present.  Subacromial space otherwise well-maintained.  Glenohumeral joint well maintained.  Procedures: Visit Diagnoses:  1. Shoulder impingement syndrome, right     Large Joint Inj: R subacromial bursa on 10/15/2018 2:48 PM Indications: pain Details: 22 G 1.5 in needle, superior approach  Arthrogram: No  Medications: 3 mL lidocaine 1 %; 40 mg methylPREDNISolone acetate 40 MG/ML Outcome: tolerated well, no immediate complications Procedure, treatment alternatives, risks and benefits explained, specific risks discussed. Consent was given by the patient. Immediately prior to procedure a time out was called to verify the correct patient, procedure, equipment, support staff and site/side marked as required. Patient was prepped and draped in the usual sterile fashion.     Plan: We will have her work on shoulder exercises handouts were given.  She is given Thera-Band.  Like to see her back in 2 weeks to see what type of response she had to the injection and to the  exercises.  If pain persist despite conservative treatment may recommend MRI to evaluate for rotator cuff tear.  Questions encouraged and answered

## 2018-10-29 DIAGNOSIS — Z23 Encounter for immunization: Secondary | ICD-10-CM | POA: Diagnosis not present

## 2018-10-31 ENCOUNTER — Ambulatory Visit: Payer: Medicare Other | Admitting: Physician Assistant

## 2018-12-19 DIAGNOSIS — F17200 Nicotine dependence, unspecified, uncomplicated: Secondary | ICD-10-CM | POA: Diagnosis not present

## 2018-12-19 DIAGNOSIS — E785 Hyperlipidemia, unspecified: Secondary | ICD-10-CM | POA: Diagnosis not present

## 2018-12-19 DIAGNOSIS — G2581 Restless legs syndrome: Secondary | ICD-10-CM | POA: Diagnosis not present

## 2018-12-19 DIAGNOSIS — Z8589 Personal history of malignant neoplasm of other organs and systems: Secondary | ICD-10-CM | POA: Diagnosis not present

## 2018-12-19 DIAGNOSIS — M810 Age-related osteoporosis without current pathological fracture: Secondary | ICD-10-CM | POA: Diagnosis not present

## 2018-12-19 DIAGNOSIS — E538 Deficiency of other specified B group vitamins: Secondary | ICD-10-CM | POA: Diagnosis not present

## 2018-12-19 DIAGNOSIS — Z23 Encounter for immunization: Secondary | ICD-10-CM | POA: Diagnosis not present

## 2018-12-19 DIAGNOSIS — Z79899 Other long term (current) drug therapy: Secondary | ICD-10-CM | POA: Diagnosis not present

## 2018-12-19 DIAGNOSIS — R8290 Unspecified abnormal findings in urine: Secondary | ICD-10-CM | POA: Diagnosis not present

## 2018-12-19 DIAGNOSIS — R911 Solitary pulmonary nodule: Secondary | ICD-10-CM | POA: Diagnosis not present

## 2018-12-19 DIAGNOSIS — J449 Chronic obstructive pulmonary disease, unspecified: Secondary | ICD-10-CM | POA: Diagnosis not present

## 2018-12-19 DIAGNOSIS — Z0000 Encounter for general adult medical examination without abnormal findings: Secondary | ICD-10-CM | POA: Diagnosis not present

## 2018-12-24 DIAGNOSIS — M810 Age-related osteoporosis without current pathological fracture: Secondary | ICD-10-CM | POA: Diagnosis not present

## 2019-01-02 ENCOUNTER — Other Ambulatory Visit: Payer: Self-pay

## 2019-01-02 ENCOUNTER — Encounter: Payer: Self-pay | Admitting: Physician Assistant

## 2019-01-02 ENCOUNTER — Ambulatory Visit (INDEPENDENT_AMBULATORY_CARE_PROVIDER_SITE_OTHER): Payer: Medicare Other | Admitting: Physician Assistant

## 2019-01-02 DIAGNOSIS — M7541 Impingement syndrome of right shoulder: Secondary | ICD-10-CM

## 2019-01-02 MED ORDER — METHYLPREDNISOLONE ACETATE 40 MG/ML IJ SUSP
40.0000 mg | INTRAMUSCULAR | Status: AC | PRN
Start: 2019-01-02 — End: 2019-01-02
  Administered 2019-01-02: 40 mg via INTRA_ARTICULAR

## 2019-01-02 MED ORDER — LIDOCAINE HCL 1 % IJ SOLN
3.0000 mL | INTRAMUSCULAR | Status: AC | PRN
  Administered 2019-01-02: 17:00:00 3 mL
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
## 2019-01-08 ENCOUNTER — Telehealth: Payer: Self-pay | Admitting: Physician Assistant

## 2019-01-08 MED ORDER — GABAPENTIN 300 MG PO CAPS: ORAL_CAPSULE | ORAL | 1 refills | Status: DC

## 2019-01-08 NOTE — Telephone Encounter (Signed)
Ok to fill 

## 2019-01-08 NOTE — Telephone Encounter (Signed)
Pt called in requesting a refill on Gabapentin, Please have that sent to [facility] on [address].   [phone]

## 2019-01-08 NOTE — Telephone Encounter (Signed)
I sent in some  

## 2019-01-15 ENCOUNTER — Telehealth: Payer: Self-pay | Admitting: Physician Assistant

## 2019-01-15 ENCOUNTER — Other Ambulatory Visit: Payer: Self-pay

## 2019-01-15 DIAGNOSIS — G8929 Other chronic pain: Secondary | ICD-10-CM

## 2019-01-15 NOTE — Telephone Encounter (Signed)
MRI ordered

## 2019-01-15 NOTE — Telephone Encounter (Signed)
Pt called in said she would like to cancel her appt with Artis Delay for tomorrow 11/25 due to the second injection she received did not help her pain and is just wanting to go ahead and proceed with getting an mri done for bilateral shoulder's.   [phone]

## 2019-01-16 ENCOUNTER — Ambulatory Visit: Payer: Medicare Other | Admitting: Physician Assistant

## 2019-01-23 NOTE — Addendum Note (Signed)
Addended by: Daylene Posey T on: 01/23/2019 10:35 AM   Modules accepted: Orders

## 2019-01-30 ENCOUNTER — Ambulatory Visit (INDEPENDENT_AMBULATORY_CARE_PROVIDER_SITE_OTHER): Payer: Medicare Other

## 2019-01-30 ENCOUNTER — Ambulatory Visit (INDEPENDENT_AMBULATORY_CARE_PROVIDER_SITE_OTHER): Payer: Medicare Other | Admitting: Physician Assistant

## 2019-01-30 ENCOUNTER — Other Ambulatory Visit: Payer: Self-pay

## 2019-01-30 ENCOUNTER — Encounter: Payer: Self-pay | Admitting: Physician Assistant

## 2019-01-30 DIAGNOSIS — M79671 Pain in right foot: Secondary | ICD-10-CM

## 2019-01-30 MED ORDER — METHYLPREDNISOLONE ACETATE 40 MG/ML IJ SUSP
40.0000 mg | INTRAMUSCULAR | Status: AC | PRN
  Administered 2019-01-30: 40 mg via INTRA_ARTICULAR

## 2019-01-30 MED ORDER — LIDOCAINE HCL 1 % IJ SOLN
1.0000 mL | INTRAMUSCULAR | Status: AC | PRN
  Administered 2019-01-30: 1 mL
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
## 2019-02-04 DIAGNOSIS — M25511 Pain in right shoulder: Secondary | ICD-10-CM | POA: Diagnosis not present

## 2019-02-04 DIAGNOSIS — M25512 Pain in left shoulder: Secondary | ICD-10-CM | POA: Diagnosis not present

## 2019-02-06 ENCOUNTER — Ambulatory Visit: Payer: Medicare Other | Admitting: Physician Assistant

## 2019-02-11 ENCOUNTER — Other Ambulatory Visit: Payer: Self-pay

## 2019-02-11 ENCOUNTER — Ambulatory Visit (INDEPENDENT_AMBULATORY_CARE_PROVIDER_SITE_OTHER): Payer: Medicare Other | Admitting: Physician Assistant

## 2019-02-11 ENCOUNTER — Encounter: Payer: Self-pay | Admitting: Physician Assistant

## 2019-02-11 ENCOUNTER — Ambulatory Visit (INDEPENDENT_AMBULATORY_CARE_PROVIDER_SITE_OTHER): Payer: Medicare Other

## 2019-02-11 DIAGNOSIS — Z96641 Presence of right artificial hip joint: Secondary | ICD-10-CM

## 2019-02-11 DIAGNOSIS — M75101 Unspecified rotator cuff tear or rupture of right shoulder, not specified as traumatic: Secondary | ICD-10-CM | POA: Diagnosis not present

## 2019-02-11 MED ORDER — TRAMADOL HCL 50 MG PO TABS: 50.0000 mg | ORAL_TABLET | Freq: Four times a day (QID) | ORAL | 0 refills | Status: DC | PRN
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
## 2019-03-12 ENCOUNTER — Other Ambulatory Visit: Payer: Self-pay | Admitting: Physician Assistant

## 2019-03-12 NOTE — Telephone Encounter (Signed)
Please advise 

## 2019-03-14 ENCOUNTER — Telehealth: Payer: Self-pay | Admitting: Physical Medicine and Rehabilitation

## 2019-03-14 NOTE — Telephone Encounter (Signed)
Yes if very beneficial or OV

## 2019-03-15 NOTE — Telephone Encounter (Signed)
Scheduled for 2/3 at 1430 with driver.

## 2019-03-15 NOTE — Telephone Encounter (Signed)
Left message #1

## 2019-03-18 ENCOUNTER — Other Ambulatory Visit (INDEPENDENT_AMBULATORY_CARE_PROVIDER_SITE_OTHER): Payer: Self-pay

## 2019-03-18 ENCOUNTER — Encounter: Payer: Self-pay | Admitting: Orthopaedic Surgery

## 2019-03-18 ENCOUNTER — Other Ambulatory Visit: Payer: Self-pay

## 2019-03-18 ENCOUNTER — Ambulatory Visit (INDEPENDENT_AMBULATORY_CARE_PROVIDER_SITE_OTHER): Payer: Medicare Other | Admitting: Orthopaedic Surgery

## 2019-03-18 DIAGNOSIS — M79671 Pain in right foot: Secondary | ICD-10-CM

## 2019-03-18 NOTE — Progress Notes (Signed)
Patient comes in today with continued right foot pain.  Her x-rays did not show anything significant disorders of her foot and ankle but she points to the lateral ray as a source of her pain that radiates up into her ankle.  She had a sinus tarsus injection which she said did not hurt but also did not help.  She has tried shoe modification with shoewear changes.  She does feel like she is walking more on the inside of her foot to take the stress off of the outside part of her foot.  This has been getting worse for the right foot with time and weightbearing activities.  Also twisting the foot in bed causes her severe pain.  On exam her pain seems to be along the lateral ray/fifth ray of her right foot.  She does have a slightly wide foot but otherwise no glaring deformities.  At this point a MRI of the right foot is warranted to assess for stress fracture and to rule out other pathology that may be the source of her severe right foot pain.  I do feel this is warranted given the failure of conservative treatment includes shoewear changes, activity modification, anti-inflammatories, rest, ice and topical anti-inflammatories.  She will call us when she knows when her MRI is to schedule appointment for at least 2 to 3 days afterwards.  All question concerns were answered and addressed.

## 2019-03-19 MED ORDER — CYCLOBENZAPRINE HCL 10 MG PO TABS: 10.0000 mg | ORAL_TABLET | Freq: Three times a day (TID) | ORAL | 0 refills | Status: DC

## 2019-03-19 NOTE — Telephone Encounter (Signed)
Please advise 

## 2019-03-19 NOTE — Telephone Encounter (Signed)
Pls advise. Thanks.  

## 2019-03-27 ENCOUNTER — Ambulatory Visit: Payer: Self-pay

## 2019-03-27 ENCOUNTER — Encounter: Payer: Self-pay | Admitting: Physical Medicine and Rehabilitation

## 2019-03-27 ENCOUNTER — Ambulatory Visit (INDEPENDENT_AMBULATORY_CARE_PROVIDER_SITE_OTHER): Payer: Medicare Other | Admitting: Physical Medicine and Rehabilitation

## 2019-03-27 ENCOUNTER — Other Ambulatory Visit: Payer: Self-pay

## 2019-03-27 VITALS — BP 122/60 | HR 77

## 2019-03-27 DIAGNOSIS — M47816 Spondylosis without myelopathy or radiculopathy, lumbar region: Secondary | ICD-10-CM | POA: Diagnosis not present

## 2019-03-27 MED ORDER — METHYLPREDNISOLONE ACETATE 80 MG/ML IJ SUSP
40.0000 mg | Freq: Once | INTRAMUSCULAR | Status: AC
  Administered 2019-03-27: 40 mg

## 2019-03-27 NOTE — Progress Notes (Signed)
.  Numeric Pain Rating Scale and Functional Assessment Average Pain 7   In the last MONTH (on 0-10 scale) has pain interfered with the following?  1. General activity like being  able to carry out your everyday physical activities such as walking, climbing stairs, carrying groceries, or moving a chair?  Rating(7)   +Driver, -BT, -Dye Allergies.   

## 2019-04-01 ENCOUNTER — Other Ambulatory Visit: Payer: Self-pay

## 2019-04-01 ENCOUNTER — Ambulatory Visit
Admission: RE | Admit: 2019-04-01 | Discharge: 2019-04-01 | Disposition: A | Discharge status: Home / Self Care | Payer: Medicare Other | Source: Ambulatory Visit | Attending: Orthopaedic Surgery | Admitting: Orthopaedic Surgery

## 2019-04-01 DIAGNOSIS — M79671 Pain in right foot: Secondary | ICD-10-CM | POA: Diagnosis not present

## 2019-04-03 ENCOUNTER — Ambulatory Visit (INDEPENDENT_AMBULATORY_CARE_PROVIDER_SITE_OTHER): Payer: Medicare Other | Admitting: Orthopaedic Surgery

## 2019-04-03 ENCOUNTER — Other Ambulatory Visit: Payer: Self-pay

## 2019-04-03 ENCOUNTER — Encounter: Payer: Self-pay | Admitting: Orthopaedic Surgery

## 2019-04-03 DIAGNOSIS — M79671 Pain in right foot: Secondary | ICD-10-CM
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
## 2019-04-09 DIAGNOSIS — Z1231 Encounter for screening mammogram for malignant neoplasm of breast: Secondary | ICD-10-CM | POA: Diagnosis not present

## 2019-04-09 DIAGNOSIS — M8589 Other specified disorders of bone density and structure, multiple sites: Secondary | ICD-10-CM | POA: Diagnosis not present

## 2019-04-15 ENCOUNTER — Other Ambulatory Visit: Payer: Self-pay

## 2019-04-15 ENCOUNTER — Encounter: Payer: Self-pay | Admitting: Orthopedic Surgery

## 2019-04-15 ENCOUNTER — Ambulatory Visit: Payer: Medicare Other | Admitting: Orthopedic Surgery

## 2019-04-15 ENCOUNTER — Ambulatory Visit (INDEPENDENT_AMBULATORY_CARE_PROVIDER_SITE_OTHER): Payer: Medicare Other | Admitting: Orthopedic Surgery

## 2019-04-15 VITALS — Ht 64.0 in | Wt 155.0 lb

## 2019-04-15 DIAGNOSIS — M19071 Primary osteoarthritis, right ankle and foot: Secondary | ICD-10-CM | POA: Diagnosis not present

## 2019-04-15 DIAGNOSIS — J449 Chronic obstructive pulmonary disease, unspecified: Secondary | ICD-10-CM | POA: Diagnosis not present

## 2019-04-15 DIAGNOSIS — K219 Gastro-esophageal reflux disease without esophagitis: Secondary | ICD-10-CM | POA: Diagnosis not present

## 2019-04-15 DIAGNOSIS — F17200 Nicotine dependence, unspecified, uncomplicated: Secondary | ICD-10-CM | POA: Diagnosis not present

## 2019-04-15 DIAGNOSIS — M79671 Pain in right foot: Secondary | ICD-10-CM

## 2019-04-15 MED ORDER — METHYLPREDNISOLONE ACETATE 40 MG/ML IJ SUSP
40.0000 mg | INTRAMUSCULAR | Status: AC | PRN
  Administered 2019-04-15: 17:00:00 40 mg via INTRA_ARTICULAR

## 2019-04-15 MED ORDER — LIDOCAINE HCL 1 % IJ SOLN
1.0000 mL | INTRAMUSCULAR | Status: AC | PRN
  Administered 2019-04-15: 1 mL
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
## 2019-04-18 DIAGNOSIS — F17200 Nicotine dependence, unspecified, uncomplicated: Secondary | ICD-10-CM | POA: Diagnosis not present

## 2019-04-21 ENCOUNTER — Other Ambulatory Visit: Payer: Self-pay

## 2019-04-21 ENCOUNTER — Ambulatory Visit: Payer: Medicare Other | Attending: Internal Medicine

## 2019-04-21 DIAGNOSIS — Z23 Encounter for immunization: Secondary | ICD-10-CM | POA: Insufficient documentation

## 2019-04-21 NOTE — Progress Notes (Signed)
   Covid-19 Vaccination Clinic  Name:  Erica Richardson    MRN: [id number] DOB: 08/28/1950  04/21/2019  Erica Richardson was observed post Covid-19 immunization for 15 minutes without incidence. She was provided with Vaccine Information Sheet and instruction to access the V-Safe system.   Erica Richardson was instructed to call 911 with any severe reactions post vaccine: Marland Kitchen Difficulty breathing  . Swelling of your face and throat  . A fast heartbeat  . A bad rash all over your body  . Dizziness and weakness    Immunizations Administered    Name Date Dose VIS Date Route   Pfizer COVID-19 Vaccine 04/21/2019  1:22 PM 0.3 mL 02/01/2019 Intramuscular   Manufacturer: [place]   Lot: [id number]   [place]: [id number]

## 2019-04-22 ENCOUNTER — Other Ambulatory Visit: Payer: Self-pay | Admitting: Physician Assistant

## 2019-05-13 ENCOUNTER — Ambulatory Visit: Payer: Medicare Other | Admitting: Orthopedic Surgery

## 2019-05-14 ENCOUNTER — Other Ambulatory Visit: Payer: Self-pay | Admitting: Physician Assistant

## 2019-05-14 NOTE — Telephone Encounter (Signed)
Please advise 

## 2019-05-21 ENCOUNTER — Ambulatory Visit: Payer: Medicare Other | Attending: Internal Medicine

## 2019-05-21 DIAGNOSIS — Z23 Encounter for immunization: Secondary | ICD-10-CM

## 2019-05-21 NOTE — Progress Notes (Signed)
   Covid-19 Vaccination Clinic  Name:  Erica Richardson    MRN: [id number] DOB: [birth year]  05/21/2019  [name] was observed post Covid-19 immunization for 15 minutes without incident. She was provided with Vaccine Information Sheet and instruction to access the V-Safe system.   [name] was instructed to call 911 with any severe reactions post vaccine: Marland Kitchen Difficulty breathing  . Swelling of face and throat  . A fast heartbeat  . A bad rash all over body  . Dizziness and weakness   Immunizations Administered    Name Date Dose VIS Date Route   Pfizer COVID-19 Vaccine 05/21/2019  1:19 PM 0.3 mL 02/01/2019 Intramuscular   Manufacturer: [place]   Lot: [id number]   [place]: [id number]

## 2019-05-23 DIAGNOSIS — Q6689 Other  specified congenital deformities of feet: Secondary | ICD-10-CM | POA: Insufficient documentation

## 2019-05-23 DIAGNOSIS — M19071 Primary osteoarthritis, right ankle and foot: Secondary | ICD-10-CM | POA: Diagnosis not present

## 2019-05-23 DIAGNOSIS — M85871 Other specified disorders of bone density and structure, right ankle and foot: Secondary | ICD-10-CM | POA: Diagnosis not present

## 2019-05-23 DIAGNOSIS — Q6601 Congenital talipes equinovarus, right foot: Secondary | ICD-10-CM | POA: Diagnosis not present

## 2019-05-23 DIAGNOSIS — Z4789 Encounter for other orthopedic aftercare: Secondary | ICD-10-CM | POA: Diagnosis not present

## 2019-05-23 DIAGNOSIS — M79671 Pain in right foot: Secondary | ICD-10-CM | POA: Diagnosis not present

## 2019-05-28 ENCOUNTER — Ambulatory Visit: Payer: Medicare Other | Admitting: Orthopedic Surgery

## 2019-05-29 ENCOUNTER — Other Ambulatory Visit: Payer: Self-pay | Admitting: Physician Assistant

## 2019-05-29 NOTE — Telephone Encounter (Signed)
Please advise 

## 2019-06-09 NOTE — Procedures (Signed)
Lumbar Facet Joint Intra-Articular Injection(s) with Fluoroscopic Guidance  Patient: Erica Richardson      Date of Birth: April 16, 1950 MRN: [id number] PCP: Hulan Fess, MD      Visit Date: 03/27/2019   Universal Protocol:    Date/Time: 03/27/2019  Consent Given By: the patient  Position: PRONE   Additional Comments: Vital signs were monitored before and after the procedure. Patient was prepped and draped in the usual sterile fashion. The correct patient, procedure, and site was verified.   Injection Procedure Details:  Procedure Site One Meds Administered:  Meds ordered this encounter  Medications  . methylPREDNISolone acetate (DEPO-MEDROL) injection 40 mg     Laterality: Left  Location/Site:  L3-L4 L5-S1  Needle size: 22 guage  Needle type: Spinal  Needle Placement: Articular  Findings:  -Comments: Excellent flow of contrast producing a partial arthrogram.  Procedure Details: The fluoroscope beam is vertically oriented in AP, and the inferior recess is visualized beneath the lower pole of the inferior apophyseal process, which represents the target point for needle insertion. When direct visualization is difficult the target point is located at the medial projection of the vertebral pedicle. The region overlying each aforementioned target is locally anesthetized with a 1 to 2 ml. volume of 1% Lidocaine without Epinephrine.   The spinal needle was inserted into each of the above mentioned facet joints using biplanar fluoroscopic guidance. A 0.25 to 0.5 ml. volume of Isovue-250 was injected and a partial facet joint arthrogram was obtained. A single spot film was obtained of the resulting arthrogram.    One to 1.25 ml of the steroid/anesthetic solution was then injected into each of the facet joints noted above.   Additional Comments:  The patient tolerated the procedure well Dressing: 2 x 2 sterile gauze and Band-Aid    Post-procedure details: Patient was  observed during the procedure. Post-procedure instructions were reviewed.  Patient left the clinic in stable condition.

## 2019-06-09 NOTE — Progress Notes (Signed)
Erica Richardson - 69 y.o. female MRN [id number]  Date of birth: 07-Sep-1950  Office Visit Note: Visit Date: 03/27/2019 PCP: Hulan Fess, MD Referred by: Hulan Fess, MD  Subjective: Chief Complaint  Patient presents with  . Lower Back - Pain   HPI:  Erica Richardson is a 69 y.o. female who comes in today For planned repeat left L3-4 and L4-5 intra-articular facet joint injection.  Patient did extremely well with intra-articular injection in February of last year.  She got relief up until just recently.  She has left-sided axial back pain worse with standing and twisting.  MRI evidence of mainly facet arthropathy at those 2 levels in particular.  It seems to skip the L4-5 level.  She has had a prior hip arthroplasty and she is continues to be followed by Dr. Jean Rosenthal and Benita Stabile, PA-C.  I think at this point given the fact that she did so well with the intra-articular injection it makes sense to repeat this.  We could look at diagnostic medial branch blocks and radiofrequency ablation at some point.  She has no radicular complaints otherwise.  ROS Otherwise per HPI.  Assessment & Plan: Visit Diagnoses:  1. Spondylosis without myelopathy or radiculopathy, lumbar region     Plan: No additional findings.   Meds & Orders:  Meds ordered this encounter  Medications  . methylPREDNISolone acetate (DEPO-MEDROL) injection 40 mg    Orders Placed This Encounter  Procedures  . Facet Injection  . XR C-ARM NO REPORT    Follow-up: Return if symptoms worsen or fail to improve.   Procedures: No procedures performed  Lumbar Facet Joint Intra-Articular Injection(s) with Fluoroscopic Guidance  Patient: Erica Richardson      Date of Birth: [birth year] MRN: [id number] PCP: Hulan Fess, MD      Visit Date: 03/27/2019   Universal Protocol:    Date/Time: 03/27/2019  Consent Given By: the patient  Position: PRONE   Additional Comments: Vital signs were monitored before and  after the procedure. Patient was prepped and draped in the usual sterile fashion. The correct patient, procedure, and site was verified.   Injection Procedure Details:  Procedure Site One Meds Administered:  Meds ordered this encounter  Medications  . methylPREDNISolone acetate (DEPO-MEDROL) injection 40 mg     Laterality: Left  Location/Site:  L3-L4 L5-S1  Needle size: 22 guage  Needle type: Spinal  Needle Placement: Articular  Findings:  -Comments: Excellent flow of contrast producing a partial arthrogram.  Procedure Details: The fluoroscope beam is vertically oriented in AP, and the inferior recess is visualized beneath the lower pole of the inferior apophyseal process, which represents the target point for needle insertion. When direct visualization is difficult the target point is located at the medial projection of the vertebral pedicle. The region overlying each aforementioned target is locally anesthetized with a 1 to 2 ml. volume of 1% Lidocaine without Epinephrine.   The spinal needle was inserted into each of the above mentioned facet joints using biplanar fluoroscopic guidance. A 0.25 to 0.5 ml. volume of Isovue-250 was injected and a partial facet joint arthrogram was obtained. A single spot film was obtained of the resulting arthrogram.    One to 1.25 ml of the steroid/anesthetic solution was then injected into each of the facet joints noted above.   Additional Comments:  The patient tolerated the procedure well Dressing: 2 x 2 sterile gauze and Band-Aid    Post-procedure details: Patient was observed during the  procedure. Post-procedure instructions were reviewed.  Patient left the clinic in stable condition.     Clinical History: MRI LUMBAR SPINE WITHOUT CONTRAST  TECHNIQUE: Multiplanar, multisequence MR imaging of the lumbar spine was performed. No intravenous contrast was administered.  COMPARISON:  11/08/2017 lumbar spine radiographs.  11/24/2011 CT abdomen and pelvis.  FINDINGS: Segmentation:  Standard.  Alignment:  Physiologic.  Vertebrae: No fracture, evidence of discitis, or bone lesion. Incomplete posterior fusion of the L5 and S1 segments no meningocele or dermal sinus identified.  Conus medullaris and cauda equina: Conus extends to the L5 level. The conus is cleft through the L3 level. Small terminal lipoma at the S2-3 segment level with intervening normal filum terminalis with the conus.  Paraspinal and other soft tissues: Negative.  Disc levels:  T12-L1: No significant disc displacement, foraminal stenosis, or canal stenosis.  L1-2: No significant disc displacement, foraminal stenosis, or canal stenosis.  L2-3: No significant disc displacement, foraminal stenosis, or canal stenosis.  L3-4: No significant disc displacement. Bilateral facet hypertrophy. No foraminal or canal stenosis.  L4-5: Small central protrusion with annular fissure. Bilateral facet hypertrophy. No foraminal or canal stenosis.  L5-S1: No significant disc displacement, foraminal stenosis, or canal stenosis.  IMPRESSION: 1. 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 Small caudal terminal lipoma. Incomplete posterior fusion of L5 and S1 vertebral bodies. No meningocele or dermal sinus. 2. 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 3. Mild lumbar spondylosis. No significant foraminal or canal stenosis.   Electronically Signed   By: Kristine Garbe M.D.   On: 12/15/2017 15:45     Objective:  VS:  HT:    WT:   BMI:     BP:122/60  HR:77bpm  TEMP: ( )  RESP:  Physical Exam  Ortho Exam Imaging: No results found.

## 2019-06-11 ENCOUNTER — Other Ambulatory Visit: Payer: Self-pay | Admitting: Orthopaedic Surgery

## 2019-06-11 NOTE — Telephone Encounter (Signed)
Please advise 

## 2019-06-20 ENCOUNTER — Ambulatory Visit: Payer: Medicare Other | Admitting: Orthopedic Surgery

## 2019-06-24 DIAGNOSIS — M810 Age-related osteoporosis without current pathological fracture: Secondary | ICD-10-CM | POA: Diagnosis not present

## 2019-06-25 ENCOUNTER — Telehealth: Payer: Self-pay | Admitting: Physical Medicine and Rehabilitation

## 2019-06-25 NOTE — Telephone Encounter (Signed)
If those helped and similar can review and maybe do repeat, OV otherwise

## 2019-06-25 NOTE — Telephone Encounter (Signed)
Scheduled for 5/12 at 1530 with driver.

## 2019-06-25 NOTE — Telephone Encounter (Signed)
Patient states that she is having pain in her hips and difficulty sleeping. She is requesting an appointment. Left L3-4 and L5-S1 facets2/3/21. Please advise.

## 2019-06-28 ENCOUNTER — Other Ambulatory Visit: Payer: Self-pay | Admitting: Orthopaedic Surgery

## 2019-06-28 NOTE — Telephone Encounter (Signed)
Please advise 

## 2019-07-03 ENCOUNTER — Other Ambulatory Visit: Payer: Self-pay

## 2019-07-03 ENCOUNTER — Encounter: Payer: Self-pay | Admitting: Physical Medicine and Rehabilitation

## 2019-07-03 ENCOUNTER — Ambulatory Visit (INDEPENDENT_AMBULATORY_CARE_PROVIDER_SITE_OTHER): Payer: Medicare Other | Admitting: Physical Medicine and Rehabilitation

## 2019-07-03 VITALS — BP 123/69 | HR 65

## 2019-07-03 DIAGNOSIS — M47816 Spondylosis without myelopathy or radiculopathy, lumbar region: Secondary | ICD-10-CM

## 2019-07-03 NOTE — Progress Notes (Signed)
Pt states pain in the lower back and into the buttocks. Pt states last injection 03/27/19 helped out a lot. Laying on either side makes pain worse. Pillow under head and back at night helps with pain.   .Numeric Pain Rating Scale and Functional Assessment Average Pain 7   In the last MONTH (on 0-10 scale) has pain interfered with the following?  1. General activity like being  able to carry out your everyday physical activities such as walking, climbing stairs, carrying groceries, or moving a chair?  Rating(7)   -Driver(will call for driver), -BT, -Dye Allergies.

## 2019-07-04 ENCOUNTER — Ambulatory Visit: Payer: Medicare Other | Admitting: Orthopedic Surgery

## 2019-07-08 ENCOUNTER — Ambulatory Visit (INDEPENDENT_AMBULATORY_CARE_PROVIDER_SITE_OTHER): Payer: Medicare Other | Admitting: Physical Medicine and Rehabilitation

## 2019-07-08 ENCOUNTER — Ambulatory Visit: Payer: Self-pay

## 2019-07-08 ENCOUNTER — Encounter: Payer: Self-pay | Admitting: Physical Medicine and Rehabilitation

## 2019-07-08 ENCOUNTER — Other Ambulatory Visit: Payer: Self-pay

## 2019-07-08 VITALS — BP 113/67 | HR 66 | Ht 64.0 in | Wt 155.0 lb

## 2019-07-08 DIAGNOSIS — M47816 Spondylosis without myelopathy or radiculopathy, lumbar region: Secondary | ICD-10-CM | POA: Diagnosis not present

## 2019-07-08 MED ORDER — METHYLPREDNISOLONE ACETATE 80 MG/ML IJ SUSP
80.0000 mg | Freq: Once | INTRAMUSCULAR | Status: AC
  Administered 2019-07-08: 80 mg

## 2019-07-08 NOTE — Procedures (Signed)
Lumbar Facet Joint Intra-Articular Injection(s) with Fluoroscopic Guidance  Patient: Erica Richardson      Date of Birth: 1950/04/28 MRN: [id number] PCP: Hulan Fess, MD      Visit Date: 07/08/2019   Universal Protocol:    Date/Time: 07/08/2019  Consent Given By: the patient  Position: PRONE   Additional Comments: Vital signs were monitored before and after the procedure. Patient was prepped and draped in the usual sterile fashion. The correct patient, procedure, and site was verified.   Injection Procedure Details:  Procedure Site One Meds Administered:  Meds ordered this encounter  Medications  . methylPREDNISolone acetate (DEPO-MEDROL) injection 80 mg     Laterality: Left  Location/Site:  L3-L4 L5-S1  Needle size: 22 guage  Needle type: Spinal  Needle Placement: Articular  Findings:  -Comments: Excellent flow of contrast producing a partial arthrogram.  Procedure Details: The fluoroscope beam is vertically oriented in AP, and the inferior recess is visualized beneath the lower pole of the inferior apophyseal process, which represents the target point for needle insertion. When direct visualization is difficult the target point is located at the medial projection of the vertebral pedicle. The region overlying each aforementioned target is locally anesthetized with a 1 to 2 ml. volume of 1% Lidocaine without Epinephrine.   The spinal needle was inserted into each of the above mentioned facet joints using biplanar fluoroscopic guidance. A 0.25 to 0.5 ml. volume of Isovue-250 was injected and a partial facet joint arthrogram was obtained. A single spot film was obtained of the resulting arthrogram.    One to 1.25 ml of the steroid/anesthetic solution was then injected into each of the facet joints noted above.   Additional Comments:  The patient tolerated the procedure well Dressing: 2 x 2 sterile gauze and Band-Aid    Post-procedure details: Patient was  observed during the procedure. Post-procedure instructions were reviewed.  Patient left the clinic in stable condition.

## 2019-07-08 NOTE — Progress Notes (Signed)
Erica Richardson - 69 y.o. female MRN [id number]  Date of birth: 1950-11-24  Office Visit Note: Visit Date: 07/08/2019 PCP: Hulan Fess, MD Referred by: Hulan Fess, MD  Subjective: Chief Complaint  Patient presents with  . Lower Back - Pain    Injection   HPI:  Erica Richardson is a 69 y.o. female who comes in today for planned Left L3-L4 and L5-S1 lumbar facet/medial branch block with fluoroscopic guidance.  The patient has failed conservative care including home exercise, medications, time and activity modification.  This injection will be diagnostic and hopefully therapeutic.  Please see requesting physician notes for further details and justification.  Exam shows concordant low back pain with facet joint loading and extension.  Prior diagnostic block in February did give her quite a bit of relief.  She is also been dealing with her foot which is been an issue.  She has sought out a second opinion at [facility] for her foot.  She was born with clubfoot and she does have issue with spine deformity including tethered cord syndrome and spina bifida occulta.  ROS Otherwise per HPI.  Assessment & Plan: Visit Diagnoses:  1. Spondylosis without myelopathy or radiculopathy, lumbar region     Plan: No additional findings.   Meds & Orders:  Meds ordered this encounter  Medications  . methylPREDNISolone acetate (DEPO-MEDROL) injection 80 mg    Orders Placed This Encounter  Procedures  . Facet Injection  . XR C-ARM NO REPORT    Follow-up: Return if symptoms worsen or fail to improve.   Procedures: No procedures performed  Lumbar Facet Joint Intra-Articular Injection(s) with Fluoroscopic Guidance  Patient: Erica Richardson      Date of Birth: March 27, 1950 MRN: [id number] PCP: Hulan Fess, MD      Visit Date: 07/08/2019   Universal Protocol:    Date/Time: 07/08/2019  Consent Given By: the patient  Position: PRONE   Additional Comments: Vital signs were monitored  before and after the procedure. Patient was prepped and draped in the usual sterile fashion. The correct patient, procedure, and site was verified.   Injection Procedure Details:  Procedure Site One Meds Administered:  Meds ordered this encounter  Medications  . methylPREDNISolone acetate (DEPO-MEDROL) injection 80 mg     Laterality: Left  Location/Site:  L3-L4 L5-S1  Needle size: 22 guage  Needle type: Spinal  Needle Placement: Articular  Findings:  -Comments: Excellent flow of contrast producing a partial arthrogram.  Procedure Details: The fluoroscope beam is vertically oriented in AP, and the inferior recess is visualized beneath the lower pole of the inferior apophyseal process, which represents the target point for needle insertion. When direct visualization is difficult the target point is located at the medial projection of the vertebral pedicle. The region overlying each aforementioned target is locally anesthetized with a 1 to 2 ml. volume of 1% Lidocaine without Epinephrine.   The spinal needle was inserted into each of the above mentioned facet joints using biplanar fluoroscopic guidance. A 0.25 to 0.5 ml. volume of Isovue-250 was injected and a partial facet joint arthrogram was obtained. A single spot film was obtained of the resulting arthrogram.    One to 1.25 ml of the steroid/anesthetic solution was then injected into each of the facet joints noted above.   Additional Comments:  The patient tolerated the procedure well Dressing: 2 x 2 sterile gauze and Band-Aid    Post-procedure details: Patient was observed during the procedure. Post-procedure instructions were reviewed.  Patient left the clinic in stable condition.     Clinical History: MRI LUMBAR SPINE WITHOUT CONTRAST  TECHNIQUE: Multiplanar, multisequence MR imaging of the lumbar spine was performed. No intravenous contrast was administered.  COMPARISON:  11/08/2017 lumbar spine  radiographs. 11/24/2011 CT abdomen and pelvis.  FINDINGS: Segmentation:  Standard.  Alignment:  Physiologic.  Vertebrae: No fracture, evidence of discitis, or bone lesion. Incomplete posterior fusion of the L5 and S1 segments no meningocele or dermal sinus identified.  Conus medullaris and cauda equina: Conus extends to the L5 level. The conus is cleft through the L3 level. Small terminal lipoma at the S2-3 segment level with intervening normal filum terminalis with the conus.  Paraspinal and other soft tissues: Negative.  Disc levels:  T12-L1: No significant disc displacement, foraminal stenosis, or canal stenosis.  L1-2: No significant disc displacement, foraminal stenosis, or canal stenosis.  L2-3: No significant disc displacement, foraminal stenosis, or canal stenosis.  L3-4: No significant disc displacement. Bilateral facet hypertrophy. No foraminal or canal stenosis.  L4-5: Small central protrusion with annular fissure. Bilateral facet hypertrophy. No foraminal or canal stenosis.  L5-S1: No significant disc displacement, foraminal stenosis, or canal stenosis.  IMPRESSION: 1. 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 Small caudal terminal lipoma. Incomplete posterior fusion of L5 and S1 vertebral bodies. No meningocele or dermal sinus. 2. 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 3. Mild lumbar spondylosis. No significant foraminal or canal stenosis.   Electronically Signed   By: Kristine Garbe M.D.   On: 12/15/2017 15:45     Objective:  VS:  HT:5\' 4"  (162.6 cm)   WT:155 lb (70.3 kg)  BMI:26.59    BP:113/67  HR:66bpm  TEMP: ( )  RESP:  Physical Exam Constitutional:      General: She is not in acute distress.    Appearance: Normal appearance. She is not ill-appearing.  HENT:     Head: Normocephalic and atraumatic.     Right Ear: External ear normal.     Left Ear: External ear  normal.  Eyes:     Extraocular Movements: Extraocular movements intact.  Cardiovascular:     Rate and Rhythm: Normal rate.     Pulses: Normal pulses.  Musculoskeletal:     Right lower leg: No edema.     Left lower leg: No edema.     Comments: Patient has good distal strength with no pain over the greater trochanters.  No clonus or focal weakness.  She has pain with lumbar extension going from sit to stand with facet loading.  Skin:    Findings: No erythema, lesion or rash.  Neurological:     General: No focal deficit present.     Mental Status: She is alert and oriented to person, place, and time.     Sensory: No sensory deficit.     Motor: No weakness or abnormal muscle tone.     Coordination: Coordination normal.  Psychiatric:        Mood and Affect: Mood normal.        Behavior: Behavior normal.      Imaging: XR C-ARM NO REPORT  Result Date: 07/08/2019 Please see Notes tab for imaging impression.

## 2019-07-08 NOTE — Progress Notes (Signed)
Patient presents today for lower back injections. She has had lower back for years. She had her last injections in February, but started hurting again a couple weeks ago. No pain, numbness, or tingling in her lower extremities. She states that she wakes every 2 hours at night due to back pain. She also has a right foot issue that she is seeing Dr.Duda for and that causes her to walk differently, which then causes more back pain. She takes Tramadol and Tylenol, which helps.      Numeric Pain Rating Scale and Functional Assessment Average Pain 5   In the last MONTH (on 0-10 scale) has pain interfered with the following?  1. General activity like being  able to carry out your everyday physical activities such as walking, climbing stairs, carrying groceries, or moving a chair?  Rating(10)- without pain medicine.   +Driver-yes  -BT- no  -Dye Allergies -none

## 2019-07-12 ENCOUNTER — Other Ambulatory Visit: Payer: Self-pay | Admitting: Orthopaedic Surgery

## 2019-07-12 NOTE — Telephone Encounter (Signed)
Please advise 

## 2019-07-25 ENCOUNTER — Ambulatory Visit: Payer: Medicare Other | Admitting: Orthopedic Surgery

## 2019-07-28 ENCOUNTER — Other Ambulatory Visit: Payer: Self-pay | Admitting: Orthopaedic Surgery

## 2019-07-29 NOTE — Telephone Encounter (Signed)
Please advise 

## 2019-08-11 ENCOUNTER — Other Ambulatory Visit: Payer: Self-pay | Admitting: Orthopaedic Surgery

## 2019-08-12 NOTE — Telephone Encounter (Signed)
Please advise 

## 2019-08-15 ENCOUNTER — Ambulatory Visit: Payer: Medicare Other | Admitting: Orthopedic Surgery

## 2019-08-25 ENCOUNTER — Other Ambulatory Visit: Payer: Self-pay | Admitting: Orthopaedic Surgery

## 2019-08-27 NOTE — Telephone Encounter (Signed)
Please advise 

## 2019-09-11 ENCOUNTER — Other Ambulatory Visit: Payer: Self-pay | Admitting: Orthopaedic Surgery

## 2019-09-11 NOTE — Telephone Encounter (Signed)
Pls advise.  

## 2019-09-23 ENCOUNTER — Telehealth: Payer: Self-pay

## 2019-09-23 NOTE — Telephone Encounter (Signed)
Patient called in wanting to get sch with dr newton.  

## 2019-09-24 NOTE — Telephone Encounter (Signed)
Left L3-4 and L5-S1 facets 5/17. Ok to repeat if helped, same problem/side, and no new injury?

## 2019-09-24 NOTE — Telephone Encounter (Signed)
Can you see if there was set of those before and we might want to look at RFA - if she just wants a repeat because it worked then that is ok too, her husband did not get relief from RFA

## 2019-09-25 NOTE — Telephone Encounter (Signed)
Patient reports that there is a different problem with her back that she wants to discuss. Scheduled for an OV on 8/18 at 1030.

## 2019-09-26 ENCOUNTER — Other Ambulatory Visit: Payer: Self-pay | Admitting: Physician Assistant

## 2019-09-26 NOTE — Telephone Encounter (Signed)
Please advise 

## 2019-10-07 ENCOUNTER — Other Ambulatory Visit: Payer: Self-pay | Admitting: Orthopaedic Surgery

## 2019-10-08 NOTE — Telephone Encounter (Signed)
Please advise 

## 2019-10-09 ENCOUNTER — Encounter: Payer: Self-pay | Admitting: Physical Medicine and Rehabilitation

## 2019-10-09 ENCOUNTER — Ambulatory Visit (INDEPENDENT_AMBULATORY_CARE_PROVIDER_SITE_OTHER): Payer: Medicare Other | Admitting: Physical Medicine and Rehabilitation

## 2019-10-09 ENCOUNTER — Other Ambulatory Visit: Payer: Self-pay

## 2019-10-09 DIAGNOSIS — S76312A Strain of muscle, fascia and tendon of the posterior muscle group at thigh level, left thigh, initial encounter: Secondary | ICD-10-CM | POA: Diagnosis not present

## 2019-10-09 DIAGNOSIS — M545 Low back pain, unspecified: Secondary | ICD-10-CM

## 2019-10-09 DIAGNOSIS — G8929 Other chronic pain: Secondary | ICD-10-CM

## 2019-10-09 DIAGNOSIS — M47816 Spondylosis without myelopathy or radiculopathy, lumbar region: Secondary | ICD-10-CM | POA: Diagnosis not present

## 2019-10-09 DIAGNOSIS — M62838 Other muscle spasm: Secondary | ICD-10-CM | POA: Diagnosis not present

## 2019-10-09 MED ORDER — TIZANIDINE HCL 4 MG PO TABS: 2.0000 mg | ORAL_TABLET | Freq: Three times a day (TID) | ORAL | 0 refills | Status: DC | PRN

## 2019-10-09 NOTE — Progress Notes (Signed)
Left posterior thigh pain. Difficulty bending at left hip.  Pain increases with sitting in certain chairs. Still waking up with pain she has had before on sides and in low back. Numeric Pain Rating Scale and Functional Assessment Average Pain 7   In the last MONTH (on 0-10 scale) has pain interfered with the following?  1. General activity like being  able to carry out your everyday physical activities such as walking, climbing stairs, carrying groceries, or moving a chair?  Rating(7)

## 2019-10-10 ENCOUNTER — Telehealth: Payer: Self-pay | Admitting: Physical Medicine and Rehabilitation

## 2019-10-10 ENCOUNTER — Encounter: Payer: Self-pay | Admitting: Physical Medicine and Rehabilitation
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
## 2019-10-11 ENCOUNTER — Telehealth: Payer: Self-pay | Admitting: Physical Medicine and Rehabilitation

## 2019-10-11 NOTE — Telephone Encounter (Signed)
Pt Auth# [id number] not required, pt can be sch, REF# [id number].

## 2019-10-11 NOTE — Telephone Encounter (Signed)
Is auth needed for [id number] and [id number]? Left L3-4 and L5-S1 RFA. Not yet scheduled.

## 2019-10-18 NOTE — Telephone Encounter (Signed)
Called patient to schedule RFA (9/21) and to see how she is doing since her OV on 8/18. She states that she is not having the severe muscle spasms anymore, but she is still having some pain and it is still waking her up at night. She is requesting a refill of tramadol. Please advise.

## 2019-10-21 ENCOUNTER — Other Ambulatory Visit: Payer: Self-pay | Admitting: Physical Medicine and Rehabilitation

## 2019-10-21 MED ORDER — TRAMADOL HCL 50 MG PO TABS: 50.0000 mg | ORAL_TABLET | Freq: Four times a day (QID) | ORAL | 0 refills | Status: DC | PRN

## 2019-10-21 NOTE — Telephone Encounter (Signed)
See below

## 2019-10-21 NOTE — Telephone Encounter (Signed)
Please advise 

## 2019-10-21 NOTE — Telephone Encounter (Signed)
Last tramadol on 8/17 by Dr. Ninfa Linden - send there - if needed I can look at it

## 2019-10-30 ENCOUNTER — Other Ambulatory Visit: Payer: Self-pay | Admitting: Orthopaedic Surgery

## 2019-10-30 NOTE — Telephone Encounter (Signed)
Please advise Pretty sure you did this already, but I can't sign off on it

## 2019-11-01 ENCOUNTER — Other Ambulatory Visit: Payer: Self-pay | Admitting: Physical Medicine and Rehabilitation

## 2019-11-01 NOTE — Telephone Encounter (Signed)
Please advise 

## 2019-11-11 ENCOUNTER — Other Ambulatory Visit: Payer: Self-pay | Admitting: Orthopaedic Surgery

## 2019-11-11 ENCOUNTER — Telehealth: Payer: Self-pay | Admitting: Physical Medicine and Rehabilitation

## 2019-11-11 NOTE — Telephone Encounter (Signed)
This was last filled on 10/30/19 by Dr. Ninfa Linden. Please advise.

## 2019-11-11 NOTE — Telephone Encounter (Signed)
Please advise 

## 2019-11-11 NOTE — Telephone Encounter (Signed)
Patient called requesting a refill of tramadol. Please call patient when medication has been called . Please send to pharmacy on file. Patient phone number is [phone].

## 2019-11-11 NOTE — Telephone Encounter (Signed)
At this point, we have not seen her in over 6 months.  Since tramadol is a narcotic, let her know that we cannot keep anyone on long-term narcotics.  We would need to see her and reevaluate her in the office before we provide this type of medication again or see about getting her into chronic pain management.

## 2019-11-11 NOTE — Telephone Encounter (Signed)
If that recent then see if they are refilling

## 2019-11-11 NOTE — Telephone Encounter (Signed)
I believe I may have sent this to you already

## 2019-11-12 ENCOUNTER — Ambulatory Visit: Payer: Self-pay

## 2019-11-12 ENCOUNTER — Other Ambulatory Visit: Payer: Self-pay

## 2019-11-12 ENCOUNTER — Ambulatory Visit (INDEPENDENT_AMBULATORY_CARE_PROVIDER_SITE_OTHER): Payer: Medicare Other | Admitting: Physical Medicine and Rehabilitation

## 2019-11-12 ENCOUNTER — Encounter: Payer: Self-pay | Admitting: Physical Medicine and Rehabilitation

## 2019-11-12 VITALS — BP 117/49 | HR 64

## 2019-11-12 DIAGNOSIS — M47816 Spondylosis without myelopathy or radiculopathy, lumbar region: Secondary | ICD-10-CM

## 2019-11-12 MED ORDER — TRAMADOL HCL 50 MG PO TABS: 50.0000 mg | ORAL_TABLET | Freq: Three times a day (TID) | ORAL | 0 refills | Status: AC | PRN

## 2019-11-12 MED ORDER — METHYLPREDNISOLONE ACETATE 80 MG/ML IJ SUSP
80.0000 mg | Freq: Once | INTRAMUSCULAR | Status: AC
  Administered 2019-11-12: 80 mg

## 2019-11-12 NOTE — Progress Notes (Signed)
Pt state lower back that travel to the left hip pain. Pt state it painful to get out of a chair or getting out the car. Pt state pain meds and cream helps with sum the pain.  Pt has hx of inj on 07/08/19 Pt state it worked for a while, till two month ago her pain return.  Numeric Pain Rating Scale and Functional Assessment Average Pain 7   In the last MONTH (on 0-10 scale) has pain interfered with the following?  1. General activity like being  able to carry out your everyday physical activities such as walking, climbing stairs, carrying groceries, or moving a chair?  Rating(10)   +Driver, -BT, -Dye Allergies.

## 2019-11-13 NOTE — Telephone Encounter (Signed)
Erica Richardson refilled yesterday per patient

## 2019-11-18 ENCOUNTER — Ambulatory Visit: Payer: Self-pay

## 2019-11-18 ENCOUNTER — Encounter: Payer: Self-pay | Admitting: Physician Assistant

## 2019-11-18 ENCOUNTER — Ambulatory Visit (INDEPENDENT_AMBULATORY_CARE_PROVIDER_SITE_OTHER): Payer: Medicare Other | Admitting: Physician Assistant

## 2019-11-18 DIAGNOSIS — M25512 Pain in left shoulder: Secondary | ICD-10-CM | POA: Diagnosis not present

## 2019-11-18 DIAGNOSIS — G8929 Other chronic pain: Secondary | ICD-10-CM

## 2019-11-18 DIAGNOSIS — M75101 Unspecified rotator cuff tear or rupture of right shoulder, not specified as traumatic: Secondary | ICD-10-CM

## 2019-11-18 MED ORDER — TRAMADOL HCL 50 MG PO TABS: 50.0000 mg | ORAL_TABLET | Freq: Four times a day (QID) | ORAL | 0 refills | Status: DC | PRN

## 2019-11-18 NOTE — Progress Notes (Signed)
Erica Richardson - 69 y.o. female MRN [id number]  Date of birth: [birth year]  Office Visit Note: Visit Date: 11/12/2019 PCP: Hulan Fess, MD Referred by: Hulan Fess, MD  Subjective: Chief Complaint  Patient presents with  . Lower Back - Pain   HPI:  Erica Richardson is a 69 y.o. female who comes in today for planned radiofrequency ablation of the Left L3-L4, L4-L5 and L5-S1 Lumbar facet joints. This would be ablation of the corresponding medial branches and/or dorsal rami.  Patient has had double diagnostic blocks with more than 50% relief.  These are documented on pain diary.  They have had chronic back pain for quite some time, more than 3 months, which has been an ongoing situation with recalcitrant axial back pain.  They have no radicular pain.  Their axial pain is worse with standing and ambulating and on exam today with facet loading.  They have had physical therapy as well as home exercise program.  The imaging noted in the chart below indicated facet pathology. Accordingly they meet all the criteria and qualification for for radiofrequency ablation and we are going to complete this today hopefully for more longer term relief as part of comprehensive management program.  I did refill temporarily the tramadol that she has been taking.  Hopefully this will help her through the ablation and may be at hopefully have her not taking the tramadol after that.  She has difficulty with other pain medication such as codeine and ibuprofen.   ROS Otherwise per HPI.  Assessment & Plan: Visit Diagnoses:  1. Spondylosis without myelopathy or radiculopathy, lumbar region     Plan: No additional findings.   Meds & Orders:  Meds ordered this encounter  Medications  . methylPREDNISolone acetate (DEPO-MEDROL) injection 80 mg  . traMADol (ULTRAM) 50 MG tablet    Sig: Take 1 tablet (50 mg total) by mouth every 8 (eight) hours as needed for up to 5 days for moderate pain.    Dispense:  15 tablet      Refill:  0    Orders Placed This Encounter  Procedures  . Radiofrequency,Lumbar  . XR C-ARM NO REPORT    Follow-up: Return if symptoms worsen or fail to improve.   Procedures: No procedures performed  Lumbar Facet Joint Nerve Denervation  Patient: Erica Richardson      Date of Birth: [birth year] MRN: [id number] PCP: Hulan Fess, MD      Visit Date: 11/12/2019   Universal Protocol:    Date/Time: 09/27/215:42 AM  Consent Given By: the patient  Position: PRONE  Additional Comments: Vital signs were monitored before and after the procedure. Patient was prepped and draped in the usual sterile fashion. The correct patient, procedure, and site was verified.   Injection Procedure Details:  Procedure Site One Meds Administered:  Meds ordered this encounter  Medications  . methylPREDNISolone acetate (DEPO-MEDROL) injection 80 mg  . traMADol (ULTRAM) 50 MG tablet    Sig: Take 1 tablet (50 mg total) by mouth every 8 (eight) hours as needed for up to 5 days for moderate pain.    Dispense:  15 tablet    Refill:  0     Laterality: Left  Location/Site:  L3-L4 L4-L5 L5-S1  Needle size: 18 G  Needle type: Radiofrequency cannula  Needle Placement: Along juncture of superior articular process and transverse pocess  Findings:  -Comments:  Procedure Details: For each desired target nerve, the corresponding transverse process (sacral ala for the L5  dorsal rami) was identified and the fluoroscope was positioned to square off the endplates of the corresponding vertebral body to achieve a true AP midline view.  The beam was then obliqued 15 to 20 degrees and caudally tilted 15 to 20 degrees to line up a trajectory along the target nerves. The skin over the target of the junction of superior articulating process and transverse process (sacral ala for the L5 dorsal rami) was infiltrated with 16ml of 1% Lidocaine without Epinephrine.  The 18 gauge 56mm active tip outer cannula was  advanced in trajectory view to the target.  This procedure was repeated for each target nerve.  Then, for all levels, the outer cannula placement was fine-tuned and the position was then confirmed with bi-planar imaging.    Test stimulation was done both at sensory and motor levels to ensure there was no radicular stimulation. The target tissues were then infiltrated with 1 ml of 1% Lidocaine without Epinephrine. Subsequently, a percutaneous neurotomy was carried out for 90 seconds at 80 degrees Celsius.  After the completion of the lesion, 1 ml of injectate was delivered. It was then repeated for each facet joint nerve mentioned above. Appropriate radiographs were obtained to verify the probe placement during the neurotomy.   Additional Comments:  The patient tolerated the procedure well Dressing: 2 x 2 sterile gauze and Band-Aid    Post-procedure details: Patient was observed during the procedure. Post-procedure instructions were reviewed.  Patient left the clinic in stable condition.        Clinical History: MRI LUMBAR SPINE WITHOUT CONTRAST  TECHNIQUE: Multiplanar, multisequence MR imaging of the lumbar spine was performed. No intravenous contrast was administered.  COMPARISON:  11/08/2017 lumbar spine radiographs. 11/24/2011 CT abdomen and pelvis.  FINDINGS: Segmentation:  Standard.  Alignment:  Physiologic.  Vertebrae: No fracture, evidence of discitis, or bone lesion. Incomplete posterior fusion of the L5 and S1 segments no meningocele or dermal sinus identified.  Conus medullaris and cauda equina: Conus extends to the L5 level. The conus is cleft through the L3 level. Small terminal lipoma at the S2-3 segment level with intervening normal filum terminalis with the conus.  Paraspinal and other soft tissues: Negative.  Disc levels:  T12-L1: No significant disc displacement, foraminal stenosis, or canal stenosis.  L1-2: No significant disc  displacement, foraminal stenosis, or canal stenosis.  L2-3: No significant disc displacement, foraminal stenosis, or canal stenosis.  L3-4: No significant disc displacement. Bilateral facet hypertrophy. No foraminal or canal stenosis.  L4-5: Small central protrusion with annular fissure. Bilateral facet hypertrophy. No foraminal or canal stenosis.  L5-S1: No significant disc displacement, foraminal stenosis, or canal stenosis.  IMPRESSION: 1. 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 Small caudal terminal lipoma. Incomplete posterior fusion of L5 and S1 vertebral bodies. No meningocele or dermal sinus. 2. 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 3. Mild lumbar spondylosis. No significant foraminal or canal stenosis.   Electronically Signed   By: Kristine Garbe M.D.   On: 12/15/2017 15:45     Objective:  VS:  HT:    WT:   BMI:     BP:(!) 117/49  HR:64bpm  TEMP: ( )  RESP:  Physical Exam Constitutional:      General: She is not in acute distress.    Appearance: Normal appearance. She is not ill-appearing.  HENT:     Head: Normocephalic and atraumatic.     Right Ear: External ear normal.  Left Ear: External ear normal.  Eyes:     Extraocular Movements: Extraocular movements intact.  Cardiovascular:     Rate and Rhythm: Normal rate.     Pulses: Normal pulses.  Musculoskeletal:     Right lower leg: No edema.     Left lower leg: No edema.     Comments: Patient has good distal strength with no pain over the greater trochanters.  No clonus or focal weakness. Patient somewhat slow to rise from a seated position to full extension.  There is concordant low back pain with facet loading and lumbar spine extension rotation.  There are no definitive trigger points but the patient is somewhat tender across the lower back and PSIS.  There is no pain with hip rotation.   Skin:    Findings: No erythema, lesion or  rash.  Neurological:     General: No focal deficit present.     Mental Status: She is alert and oriented to person, place, and time.     Sensory: No sensory deficit.     Motor: No weakness or abnormal muscle tone.     Coordination: Coordination normal.  Psychiatric:        Mood and Affect: Mood normal.        Behavior: Behavior normal.      Imaging: No results found.

## 2019-11-18 NOTE — Procedures (Signed)
Lumbar Facet Joint Nerve Denervation  Patient: Erica Richardson      Date of Birth: April 04, 1950 MRN: [id number] PCP: Hulan Fess, MD      Visit Date: 11/12/2019   Universal Protocol:    Date/Time: 09/27/215:42 AM  Consent Given By: the patient  Position: PRONE  Additional Comments: Vital signs were monitored before and after the procedure. Patient was prepped and draped in the usual sterile fashion. The correct patient, procedure, and site was verified.   Injection Procedure Details:  Procedure Site One Meds Administered:  Meds ordered this encounter  Medications  . methylPREDNISolone acetate (DEPO-MEDROL) injection 80 mg  . traMADol (ULTRAM) 50 MG tablet    Sig: Take 1 tablet (50 mg total) by mouth every 8 (eight) hours as needed for up to 5 days for moderate pain.    Dispense:  15 tablet    Refill:  0     Laterality: Left  Location/Site:  L3-L4 L4-L5 L5-S1  Needle size: 18 G  Needle type: Radiofrequency cannula  Needle Placement: Along juncture of superior articular process and transverse pocess  Findings:  -Comments:  Procedure Details: For each desired target nerve, the corresponding transverse process (sacral ala for the L5 dorsal rami) was identified and the fluoroscope was positioned to square off the endplates of the corresponding vertebral body to achieve a true AP midline view.  The beam was then obliqued 15 to 20 degrees and caudally tilted 15 to 20 degrees to line up a trajectory along the target nerves. The skin over the target of the junction of superior articulating process and transverse process (sacral ala for the L5 dorsal rami) was infiltrated with 38ml of 1% Lidocaine without Epinephrine.  The 18 gauge 10mm active tip outer cannula was advanced in trajectory view to the target.  This procedure was repeated for each target nerve.  Then, for all levels, the outer cannula placement was fine-tuned and the position was then confirmed with bi-planar  imaging.    Test stimulation was done both at sensory and motor levels to ensure there was no radicular stimulation. The target tissues were then infiltrated with 1 ml of 1% Lidocaine without Epinephrine. Subsequently, a percutaneous neurotomy was carried out for 90 seconds at 80 degrees Celsius.  After the completion of the lesion, 1 ml of injectate was delivered. It was then repeated for each facet joint nerve mentioned above. Appropriate radiographs were obtained to verify the probe placement during the neurotomy.   Additional Comments:  The patient tolerated the procedure well Dressing: 2 x 2 sterile gauze and Band-Aid    Post-procedure details: Patient was observed during the procedure. Post-procedure instructions were reviewed.  Patient left the clinic in stable condition.
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
## 2019-11-27 ENCOUNTER — Other Ambulatory Visit: Payer: Self-pay | Admitting: Physician Assistant

## 2019-11-27 NOTE — Telephone Encounter (Signed)
Please advise 

## 2019-12-04 ENCOUNTER — Other Ambulatory Visit: Payer: Self-pay | Admitting: Physician Assistant

## 2019-12-05 ENCOUNTER — Encounter (HOSPITAL_BASED_OUTPATIENT_CLINIC_OR_DEPARTMENT_OTHER): Payer: Self-pay | Admitting: Orthopaedic Surgery

## 2019-12-05 ENCOUNTER — Other Ambulatory Visit: Payer: Self-pay

## 2019-12-09 ENCOUNTER — Other Ambulatory Visit (HOSPITAL_COMMUNITY): Payer: Medicare Other

## 2019-12-10 ENCOUNTER — Other Ambulatory Visit (HOSPITAL_COMMUNITY)
Admission: RE | Admit: 2019-12-10 | Discharge: 2019-12-10 | Disposition: A | Discharge status: Home / Self Care | Payer: Medicare Other | Source: Ambulatory Visit | Attending: Orthopaedic Surgery | Admitting: Orthopaedic Surgery

## 2019-12-10 ENCOUNTER — Other Ambulatory Visit: Payer: Self-pay | Admitting: Orthopaedic Surgery

## 2019-12-10 DIAGNOSIS — Z20822 Contact with and (suspected) exposure to covid-19: Secondary | ICD-10-CM | POA: Insufficient documentation

## 2019-12-10 DIAGNOSIS — Z01812 Encounter for preprocedural laboratory examination: Secondary | ICD-10-CM | POA: Diagnosis not present

## 2019-12-10 LAB — SARS CORONAVIRUS 2 (TAT 6-24 HRS): SARS Coronavirus 2: NEGATIVE

## 2019-12-10 NOTE — Telephone Encounter (Signed)
Please advise 

## 2019-12-10 NOTE — Progress Notes (Signed)

## 2019-12-12 ENCOUNTER — Encounter (HOSPITAL_BASED_OUTPATIENT_CLINIC_OR_DEPARTMENT_OTHER)
Admission: RE | Disposition: A | Discharge status: Home / Self Care | Payer: Self-pay | Source: Home / Self Care | Attending: Orthopaedic Surgery

## 2019-12-12 ENCOUNTER — Ambulatory Visit (HOSPITAL_BASED_OUTPATIENT_CLINIC_OR_DEPARTMENT_OTHER): Payer: Medicare Other | Admitting: Certified Registered"

## 2019-12-12 ENCOUNTER — Encounter (HOSPITAL_BASED_OUTPATIENT_CLINIC_OR_DEPARTMENT_OTHER): Payer: Self-pay | Admitting: Orthopaedic Surgery

## 2019-12-12 ENCOUNTER — Other Ambulatory Visit: Payer: Self-pay

## 2019-12-12 ENCOUNTER — Ambulatory Visit (HOSPITAL_BASED_OUTPATIENT_CLINIC_OR_DEPARTMENT_OTHER)
Admission: RE | Admit: 2019-12-12 | Discharge: 2019-12-12 | Disposition: A | Discharge status: Home / Self Care | Payer: Medicare Other | Attending: Orthopaedic Surgery | Admitting: Orthopaedic Surgery

## 2019-12-12 DIAGNOSIS — E785 Hyperlipidemia, unspecified: Secondary | ICD-10-CM | POA: Diagnosis not present

## 2019-12-12 DIAGNOSIS — G8918 Other acute postprocedural pain: Secondary | ICD-10-CM | POA: Diagnosis not present

## 2019-12-12 DIAGNOSIS — Z881 Allergy status to other antibiotic agents status: Secondary | ICD-10-CM | POA: Diagnosis not present

## 2019-12-12 DIAGNOSIS — M1990 Unspecified osteoarthritis, unspecified site: Secondary | ICD-10-CM | POA: Diagnosis not present

## 2019-12-12 DIAGNOSIS — Z7951 Long term (current) use of inhaled steroids: Secondary | ICD-10-CM | POA: Diagnosis not present

## 2019-12-12 DIAGNOSIS — F17210 Nicotine dependence, cigarettes, uncomplicated: Secondary | ICD-10-CM | POA: Insufficient documentation

## 2019-12-12 DIAGNOSIS — Z885 Allergy status to narcotic agent status: Secondary | ICD-10-CM | POA: Insufficient documentation

## 2019-12-12 DIAGNOSIS — Z886 Allergy status to analgesic agent status: Secondary | ICD-10-CM | POA: Diagnosis not present

## 2019-12-12 DIAGNOSIS — F419 Anxiety disorder, unspecified: Secondary | ICD-10-CM | POA: Insufficient documentation

## 2019-12-12 DIAGNOSIS — M75121 Complete rotator cuff tear or rupture of right shoulder, not specified as traumatic: Secondary | ICD-10-CM

## 2019-12-12 DIAGNOSIS — G8929 Other chronic pain: Secondary | ICD-10-CM | POA: Insufficient documentation

## 2019-12-12 DIAGNOSIS — M7541 Impingement syndrome of right shoulder: Secondary | ICD-10-CM | POA: Insufficient documentation

## 2019-12-12 DIAGNOSIS — Z888 Allergy status to other drugs, medicaments and biological substances status: Secondary | ICD-10-CM | POA: Insufficient documentation

## 2019-12-12 DIAGNOSIS — F32A Depression, unspecified: Secondary | ICD-10-CM | POA: Insufficient documentation

## 2019-12-12 DIAGNOSIS — Z79899 Other long term (current) drug therapy: Secondary | ICD-10-CM | POA: Diagnosis not present

## 2019-12-12 DIAGNOSIS — J449 Chronic obstructive pulmonary disease, unspecified: Secondary | ICD-10-CM | POA: Insufficient documentation

## 2019-12-12 HISTORY — PX: SHOULDER ARTHROSCOPY WITH SUBACROMIAL DECOMPRESSION: SHX5684

## 2019-12-12 SURGERY — SHOULDER ARTHROSCOPY WITH SUBACROMIAL DECOMPRESSION
Anesthesia: General | Site: Shoulder | Laterality: Right

## 2019-12-12 MED ORDER — PROPOFOL 10 MG/ML IV BOLUS
INTRAVENOUS | Status: AC
  Filled 2019-12-12: qty 20

## 2019-12-12 MED ORDER — PROMETHAZINE HCL 25 MG/ML IJ SOLN: 6.2500 mg | INTRAMUSCULAR | Status: DC | PRN

## 2019-12-12 MED ORDER — MIDAZOLAM HCL 2 MG/2ML IJ SOLN
2.0000 mg | Freq: Once | INTRAMUSCULAR | Status: AC
  Administered 2019-12-12: 2 mg via INTRAVENOUS

## 2019-12-12 MED ORDER — FENTANYL CITRATE (PF) 100 MCG/2ML IJ SOLN
INTRAMUSCULAR | Status: AC
  Filled 2019-12-12: qty 2

## 2019-12-12 MED ORDER — HYDROMORPHONE HCL 1 MG/ML IJ SOLN: 0.2500 mg | INTRAMUSCULAR | Status: DC | PRN

## 2019-12-12 MED ORDER — OXYCODONE HCL 5 MG/5ML PO SOLN: 5.0000 mg | Freq: Once | ORAL | Status: DC | PRN

## 2019-12-12 MED ORDER — PROPOFOL 10 MG/ML IV BOLUS
INTRAVENOUS | Status: DC | PRN
  Administered 2019-12-12: 200 mg via INTRAVENOUS

## 2019-12-12 MED ORDER — CEFAZOLIN SODIUM-DEXTROSE 2-3 GM-%(50ML) IV SOLR
INTRAVENOUS | Status: DC | PRN
  Administered 2019-12-12: 2 g via INTRAVENOUS

## 2019-12-12 MED ORDER — PROPOFOL 500 MG/50ML IV EMUL
INTRAVENOUS | Status: AC
  Filled 2019-12-12: qty 50

## 2019-12-12 MED ORDER — MIDAZOLAM HCL 2 MG/2ML IJ SOLN
INTRAMUSCULAR | Status: AC
  Filled 2019-12-12: qty 2

## 2019-12-12 MED ORDER — MEPERIDINE HCL 25 MG/ML IJ SOLN: 6.2500 mg | INTRAMUSCULAR | Status: DC | PRN

## 2019-12-12 MED ORDER — OXYCODONE HCL 5 MG PO TABS: 5.0000 mg | ORAL_TABLET | Freq: Once | ORAL | Status: DC | PRN

## 2019-12-12 MED ORDER — LACTATED RINGERS IV SOLN: INTRAVENOUS | Status: DC

## 2019-12-12 MED ORDER — HYDROCODONE-ACETAMINOPHEN 5-325 MG PO TABS
1.0000 | ORAL_TABLET | Freq: Four times a day (QID) | ORAL | 0 refills | Status: DC | PRN
Start: 2019-12-12 — End: 2019-12-20

## 2019-12-12 MED ORDER — LACTATED RINGERS IV SOLN: INTRAVENOUS | Status: DC | PRN

## 2019-12-12 MED ORDER — CEFAZOLIN SODIUM-DEXTROSE 2-4 GM/100ML-% IV SOLN: 2.0000 g | INTRAVENOUS | Status: DC

## 2019-12-12 MED ORDER — AMISULPRIDE (ANTIEMETIC) 5 MG/2ML IV SOLN: 10.0000 mg | Freq: Once | INTRAVENOUS | Status: DC | PRN

## 2019-12-12 MED ORDER — FENTANYL CITRATE (PF) 100 MCG/2ML IJ SOLN
100.0000 ug | Freq: Once | INTRAMUSCULAR | Status: AC
  Administered 2019-12-12: 50 ug via INTRAVENOUS

## 2019-12-12 MED ORDER — FENTANYL CITRATE (PF) 100 MCG/2ML IJ SOLN
INTRAMUSCULAR | Status: DC | PRN
  Administered 2019-12-12 (×6): 25 ug via INTRAVENOUS
  Administered 2019-12-12: 50 ug via INTRAVENOUS

## 2019-12-12 MED ORDER — DEXAMETHASONE SODIUM PHOSPHATE 10 MG/ML IJ SOLN
INTRAMUSCULAR | Status: DC | PRN
  Administered 2019-12-12: 10 mg via INTRAVENOUS

## 2019-12-12 MED ORDER — BUPIVACAINE HCL (PF) 0.5 % IJ SOLN
INTRAMUSCULAR | Status: DC | PRN
  Administered 2019-12-12: 20 mL via PERINEURAL

## 2019-12-12 MED ORDER — EPHEDRINE SULFATE 50 MG/ML IJ SOLN
INTRAMUSCULAR | Status: DC | PRN
  Administered 2019-12-12 (×2): 15 mg via INTRAVENOUS

## 2019-12-12 MED ORDER — BUPIVACAINE LIPOSOME 1.3 % IJ SUSP
INTRAMUSCULAR | Status: DC | PRN
  Administered 2019-12-12: 10 mL via PERINEURAL

## 2019-12-12 MED ORDER — CEFAZOLIN SODIUM-DEXTROSE 2-4 GM/100ML-% IV SOLN
INTRAVENOUS | Status: AC
  Filled 2019-12-12: qty 100

## 2019-12-12 MED ORDER — ONDANSETRON HCL 4 MG/2ML IJ SOLN
INTRAMUSCULAR | Status: AC
  Filled 2019-12-12: qty 2

## 2019-12-12 MED ORDER — ONDANSETRON HCL 4 MG PO TABS: 4.0000 mg | ORAL_TABLET | Freq: Three times a day (TID) | ORAL | 1 refills | Status: AC | PRN

## 2019-12-12 MED ORDER — ONDANSETRON HCL 4 MG/2ML IJ SOLN
INTRAMUSCULAR | Status: DC | PRN
  Administered 2019-12-12: 4 mg via INTRAVENOUS

## 2019-12-12 SURGICAL SUPPLY — 57 items
AID PSTN UNV HD RSTRNT DISP (MISCELLANEOUS) ×2
BLADE SURG 15 STRL LF DISP TIS (BLADE) IMPLANT
BLADE SURG 15 STRL SS (BLADE)
BURR OVAL 8 FLU 4.0X13 (MISCELLANEOUS) ×3 IMPLANT
BURR OVAL 8 FLU 5.0X13 (MISCELLANEOUS) IMPLANT
CANNULA TWIST IN 8.25X7CM (CANNULA) IMPLANT
COVER WAND RF STERILE (DRAPES) IMPLANT
DECANTER SPIKE VIAL GLASS SM (MISCELLANEOUS) IMPLANT
DISSECTOR  3.8MM X 13CM (MISCELLANEOUS) ×3
DISSECTOR 3.8MM X 13CM (MISCELLANEOUS) ×2 IMPLANT
DISSECTOR 4.0MM X 13CM (MISCELLANEOUS) ×3 IMPLANT
DRAPE IMP U-DRAPE 54X76 (DRAPES) ×3 IMPLANT
DRAPE SHOULDER BEACH CHAIR (DRAPES) ×3 IMPLANT
DRAPE U-SHAPE 47X51 STRL (DRAPES) ×3 IMPLANT
DRSG PAD ABDOMINAL 8X10 ST (GAUZE/BANDAGES/DRESSINGS) ×3 IMPLANT
DURAPREP 26ML APPLICATOR (WOUND CARE) ×3 IMPLANT
ELECT REM PT RETURN 9FT ADLT (ELECTROSURGICAL)
ELECTRODE REM PT RTRN 9FT ADLT (ELECTROSURGICAL) IMPLANT
GAUZE SPONGE 4X4 12PLY STRL (GAUZE/BANDAGES/DRESSINGS) ×3 IMPLANT
GAUZE XEROFORM 1X8 LF (GAUZE/BANDAGES/DRESSINGS) ×3 IMPLANT
GLOVE BIO SURGEON STRL SZ 6.5 (GLOVE) ×3 IMPLANT
GLOVE BIO SURGEON STRL SZ7.5 (GLOVE) ×3 IMPLANT
GLOVE BIOGEL PI IND STRL 8 (GLOVE) ×4 IMPLANT
GLOVE BIOGEL PI INDICATOR 8 (GLOVE) ×2
GLOVE SURG ORTHO 8.0 STRL STRW (GLOVE) ×3 IMPLANT
GOWN STRL REUS W/ TWL LRG LVL3 (GOWN DISPOSABLE) ×2 IMPLANT
GOWN STRL REUS W/ TWL XL LVL3 (GOWN DISPOSABLE) ×4 IMPLANT
GOWN STRL REUS W/TWL LRG LVL3 (GOWN DISPOSABLE) ×3
GOWN STRL REUS W/TWL XL LVL3 (GOWN DISPOSABLE) ×6
KIT SHOULDER TRACTION (DRAPES) ×3 IMPLANT
MANIFOLD NEPTUNE II (INSTRUMENTS) ×3 IMPLANT
NDL SAFETY ECLIPSE 18X1.5 (NEEDLE) IMPLANT
NEEDLE HYPO 18GX1.5 SHARP (NEEDLE)
NEEDLE SCORPION MULTI FIRE (NEEDLE) IMPLANT
NS IRRIG 1000ML POUR BTL (IV SOLUTION) IMPLANT
PACK ARTHROSCOPY DSU (CUSTOM PROCEDURE TRAY) ×3 IMPLANT
PACK BASIN DAY SURGERY FS (CUSTOM PROCEDURE TRAY) ×3 IMPLANT
PAD ORTHO SHOULDER 7X19 LRG (SOFTGOODS) IMPLANT
PENCIL SMOKE EVACUATOR (MISCELLANEOUS) IMPLANT
PORT APPOLLO RF 90DEGREE MULTI (SURGICAL WAND) ×3 IMPLANT
RESTRAINT HEAD UNIVERSAL NS (MISCELLANEOUS) ×3 IMPLANT
SLING ARM FOAM STRAP LRG (SOFTGOODS) ×3 IMPLANT
SLING ULTRA II MEDIUM (SOFTGOODS) IMPLANT
SPONGE LAP 4X18 RFD (DISPOSABLE) IMPLANT
SUCTION FRAZIER HANDLE 10FR (MISCELLANEOUS)
SUCTION TUBE FRAZIER 10FR DISP (MISCELLANEOUS) IMPLANT
SUT ETHIBOND 2 OS 4 DA (SUTURE) IMPLANT
SUT ETHILON 3 0 PS 1 (SUTURE) ×3 IMPLANT
SUT VIC AB 2-0 SH 27 (SUTURE)
SUT VIC AB 2-0 SH 27XBRD (SUTURE) IMPLANT
SYR 20ML LL LF (SYRINGE) IMPLANT
SYR BULB EAR ULCER 3OZ GRN STR (SYRINGE) IMPLANT
TAPE HYPAFIX 6X30 (GAUZE/BANDAGES/DRESSINGS) ×3 IMPLANT
TOWEL GREEN STERILE FF (TOWEL DISPOSABLE) ×3 IMPLANT
TUBE CONNECTING 20X1/4 (TUBING) ×3 IMPLANT
TUBING ARTHROSCOPY IRRIG 16FT (MISCELLANEOUS) ×3 IMPLANT
WATER STERILE IRR 1000ML POUR (IV SOLUTION) ×3 IMPLANT

## 2019-12-12 NOTE — Transfer of Care (Signed)
Immediate Anesthesia Transfer of Care Note  Patient: Erica Richardson  Procedure(s) Performed: SHOULDER ARTHROSCOPY WITH SUBACROMIAL DECOMPRESSION WITH EXTENSIVE DEBRIDEMENT (Right Shoulder)  Patient Location: PACU  Anesthesia Type:General and Regional  Level of Consciousness: awake, alert  and oriented  Airway & Oxygen Therapy: Patient Spontanous Breathing and Patient connected to face mask oxygen  Post-op Assessment: Report given to RN and Post -op Vital signs reviewed and stable  Post vital signs: Reviewed and stable  Last Vitals:  Vitals Value Taken Time  BP    Temp    Pulse 63 12/12/19 1032  Resp 12 12/12/19 1032  SpO2 100 % 12/12/19 1032  Vitals shown include unvalidated device data.  Last Pain:  Vitals:   12/12/19 0840  TempSrc: Oral  PainSc: 0-No pain      Patients Stated Pain Goal: 5 (16/10/96 0454)  Complications: No complications documented.

## 2019-12-12 NOTE — Anesthesia Postprocedure Evaluation (Signed)
Anesthesia Post Note  Patient: Erica Richardson  Procedure(s) Performed: SHOULDER ARTHROSCOPY WITH SUBACROMIAL DECOMPRESSION WITH EXTENSIVE DEBRIDEMENT (Right Shoulder)     Patient location during evaluation: PACU Anesthesia Type: General Level of consciousness: awake and alert Pain management: pain level controlled Vital Signs Assessment: post-procedure vital signs reviewed and stable Respiratory status: spontaneous breathing, nonlabored ventilation and respiratory function stable Cardiovascular status: blood pressure returned to baseline and stable Postop Assessment: no apparent nausea or vomiting Anesthetic complications: no   No complications documented.  Last Vitals:  Vitals:   12/12/19 1045 12/12/19 1116  BP: (!) 115/51 (!) 115/51  Pulse: 71 73  Resp: 19 16  Temp:  36.5 C  SpO2: 97% 95%    Last Pain:  Vitals:   12/12/19 1116  TempSrc:   PainSc: 0-No pain                 Lynda Rainwater

## 2019-12-12 NOTE — Discharge Instructions (Signed)
Expect right shoulder pain and swelling -ice intermittently throughout the next several days. Do expect some bloody drainage from the shoulder arthroscopy portal sites/incision sites. You may remove your dressings in 24 hours and start getting your right shoulder wet in a shower. After each shower, place small Band-Aids over each incision daily. Increase your activities with the right shoulder as comfort allows. Start coming out of your sling daily as comfort allows and work on getting her shoulder moving as much as you can.   Post Anesthesia Home Care Instructions  Activity: Get plenty of rest for the remainder of the day. A responsible individual must stay with you for 24 hours following the procedure.  For the next 24 hours, DO NOT: -Drive a car -Paediatric nurse -Drink alcoholic beverages -Take any medication unless instructed by your physician -Make any legal decisions or sign important papers.  Meals: Start with liquid foods such as gelatin or soup. Progress to regular foods as tolerated. Avoid greasy, spicy, heavy foods. If nausea and/or vomiting occur, drink only clear liquids until the nausea and/or vomiting subsides. Call your physician if vomiting continues.  Special Instructions/Symptoms: Your throat may feel dry or sore from the anesthesia or the breathing tube placed in your throat during surgery. If this causes discomfort, gargle with warm salt water. The discomfort should disappear within 24 hours.  If you had a scopolamine patch placed behind your ear for the management of post- operative nausea and/or vomiting:  1. The medication in the patch is effective for 72 hours, after which it should be removed.  Wrap patch in a tissue and discard in the trash. Wash hands thoroughly with soap and water. 2. You may remove the patch earlier than 72 hours if you experience unpleasant side effects which may include dry mouth, dizziness or visual disturbances. 3. Avoid touching the  patch. Wash your hands with soap and water after contact with the patch.    Information for Discharge Teaching: EXPAREL (bupivacaine liposome injectable suspension)   Your surgeon or anesthesiologist gave you EXPAREL(bupivacaine) to help control your pain after surgery.   EXPAREL is a local anesthetic that provides pain relief by numbing the tissue around the surgical site.  EXPAREL is designed to release pain medication over time and can control pain for up to 72 hours.  Depending on how you respond to EXPAREL, you may require less pain medication during your recovery.  Possible side effects:  Temporary loss of sensation or ability to move in the area where bupivacaine was injected.  Nausea, vomiting, constipation  Rarely, numbness and tingling in your mouth or lips, lightheadedness, or anxiety may occur.  Call your doctor right away if you think you may be experiencing any of these sensations, or if you have other questions regarding possible side effects.  Follow all other discharge instructions given to you by your surgeon or nurse. Eat a healthy diet and drink plenty of water or other fluids.  If you return to the hospital for any reason within 96 hours following the administration of EXPAREL, it is important for health care providers to know that you have received this anesthetic. A teal colored band has been placed on your arm with the date, time and amount of EXPAREL you have received in order to alert and inform your health care providers. Please leave this armband in place for the full 96 hours following administration, and then you may remove the band.

## 2019-12-12 NOTE — Anesthesia Procedure Notes (Signed)
Procedure Name: LMA Insertion Performed by: Verita Lamb, CRNA Pre-anesthesia Checklist: Patient identified, Emergency Drugs available, Suction available and Patient being monitored Patient Re-evaluated:Patient Re-evaluated prior to induction Oxygen Delivery Method: Circle system utilized Preoxygenation: Pre-oxygenation with 100% oxygen Induction Type: IV induction Ventilation: Mask ventilation without difficulty LMA: LMA inserted LMA Size: 4.0 Number of attempts: 1 Airway Equipment and Method: Bite block Placement Confirmation: positive ETCO2,  CO2 detector and breath sounds checked- equal and bilateral Tube secured with: Tape Dental Injury: Teeth and Oropharynx as per pre-operative assessment

## 2019-12-12 NOTE — Op Note (Signed)
NAME: Erica Richardson, BAUCH MEDICAL RECORD OE:[id number] ACCOUNT [id number] DATE OF BIRTH:06-Jul-1950 FACILITY: [facility] LOCATION: [facility] PHYSICIAN:Rachit Grim Kerry [place], MD  OPERATIVE REPORT  DATE OF PROCEDURE:  12/12/2019  PREOPERATIVE DIAGNOSIS:  Chronic right shoulder pain with impingement syndrome and full thickness rotator cuff tear.  POSTOPERATIVE DIAGNOSIS:  Chronic right shoulder pain with impingement syndrome and full thickness rotator cuff tear.  PROCEDURE:  Right shoulder arthroscopy with extensive debridement and subacromial decompression.  SURGEON:  Lind Guest. Ninfa Linden, MD  ASSISTANT:  Erskine Emery, PA-C  ANESTHESIA:  1.  Right shoulder regional block. 2.  General.  ESTIMATED BLOOD LOSS:  Minimal.  COMPLICATIONS:  None.  INDICATIONS:  The patient is a 69 year old female well known to [name].  She has had chronic shoulder pain for many years now involving both of her shoulders.  MRIs last year of both shoulders were obtained and the right shoulder did show a full thickness  rotator cuff tear with no retraction of the supraspinatus tendon only.  She did have significant inflamed tissue in that shoulder and definitely evidence of impingement.  She has not had significant weakness and she has a low functional demand.  We did  recommend at this point, an arthroscopic intervention given the failure of conservative treatment including rest, ice, heat, anti-inflammatories, multiple steroid injections, and therapy.  We did discuss the risks and benefits of this type of  arthroscopic procedure including the risk of acute blood loss anemia, nerve or vessel injury and infection.  We talked about our goals being hopefully decreased pain and improve function of the shoulder.  DESCRIPTION OF PROCEDURE:  After informed consent was obtained and appropriate right shoulder was marked anesthesia obtained a regional shoulder block in the holding room.  She was then brought to the operating  room and placed supine on the operating  table.  General anesthesia was then obtained.  She was then fashioned into a beach chair position with appropriate positioning and padding of the head, neck and padding of the down nonoperative left arm.  There was bending at the waist and knees and  palpable pulses in her feet.  Her right operative shoulder was prepped and draped with DuraPrep and sterile drapes and placed in line skeletal traction using a fishing pole traction device and 10 pounds of traction with 45 degrees of forward flexion and  neutral rotation.  A time-out was called and she was again identified as correct patient, correct right shoulder.  We then made a posterolateral arthroscopy portal and entered the glenohumeral joint.  We found a little bit of cartilage wear on the face  of the glenoid, but not on the humeral head.  There was degenerative tearing of the labrum.  I believe the biceps tendon was gone and there was significant inflamed tissue all on the undersurface of the rotator cuff tear with some tearing.  Through the  rotator interval in the front of the shoulder we placed a soft tissue ablation wand and arthroscopic shaver and carried out extensive debridement in the glenohumeral joint of significant synovitis in the anterior labrum as well as inflamed tissue  throughout on the undersurface of the rotator cuff.  We then entered the subacromial space through the posterior portal and made a separate lateral portal.  There was significant tightness in the subacromial outlet.  Using a high speed bur we performed a  partial acromioplasty and was able to open up the subacromial outlet.  There was definitely low hanging bone spurs that we were able  to flatten back out.  We then used a soft tissue ablation wand and arthroscopic shaver to carry out a significant  bursectomy in this area.  We removed tendinosis tissue off the rotator cuff.  We did find a small area of the rotator cuff torn that  was full thickness, but no retraction, but the shoulder moved as a unit with the remainder of the rotator cuff intact.   Given her soft quality of bone and the integrity of her fibers we did not feel it was appropriate to perform any rotator cuff repair given that this was more of a pain issue for her.  There was significantly inflamed tissue in the glenohumeral joint that  we debrided.  Once we performed a subacromial decompression and extensive debridement in the subacromial space, we removed all instrumentation.  We removed all the fluid from the shoulder that we could, and closed each portal site with interrupted nylon  suture.  Xeroform well-padded sterile dressing was applied and patient's shoulder was placed in a sling.  She was awakened, extubated, and taken to recovery room in stable condition with all final counts being correct.  No complications noted.  CN/NUANCE  D:12/12/2019 T:12/12/2019 JOB:[id number]

## 2019-12-12 NOTE — Anesthesia Procedure Notes (Signed)
Anesthesia Regional Block: Interscalene brachial plexus block   Pre-Anesthetic Checklist: ,, timeout performed, Correct Patient, Correct Site, Correct Laterality, Correct Procedure, Correct Position, site marked, Risks and benefits discussed,  Surgical consent,  Pre-op evaluation,  At surgeon's request and post-op pain management  Laterality: Right  Prep: chloraprep       Needles:  Injection technique: Single-shot  Needle Type: Stimiplex     Needle Length: 9cm  Needle Gauge: 21     Additional Needles:   Procedures:,,,, ultrasound used (permanent image in chart),,,,  Narrative:  Start time: 12/12/2019 9:03 AM End time: 12/12/2019 9:08 AM Injection made incrementally with aspirations every 5 mL.  Performed by: Personally  Anesthesiologist: Lynda Rainwater, MD

## 2019-12-12 NOTE — Anesthesia Preprocedure Evaluation (Signed)
Anesthesia Evaluation  Patient identified by MRN, date of birth, ID band Patient awake    Reviewed: Allergy & Precautions, NPO status , Patient's Chart, lab work & pertinent test results  Airway Mallampati: II  TM Distance: >3 FB Neck ROM: Full    Dental  (+) Teeth Intact, Dental Advisory Given   Pulmonary COPD, Current Smoker and Patient abstained from smoking.,    Pulmonary exam normal breath sounds clear to auscultation       Cardiovascular Exercise Tolerance: Good Normal cardiovascular exam Rhythm:Regular Rate:Normal  HLD   Neuro/Psych PSYCHIATRIC DISORDERS Anxiety Depression negative neurological ROS     GI/Hepatic negative GI ROS, Neg liver ROS,   Endo/Other  negative endocrine ROS  Renal/GU negative Renal ROS     Musculoskeletal  (+) Arthritis , Osteoarthritis,    Abdominal   Peds  Hematology negative hematology ROS (+) Plt 267k   Anesthesia Other Findings Day of surgery medications reviewed with the patient.  Reproductive/Obstetrics                             Anesthesia Physical  Anesthesia Plan  ASA: II  Anesthesia Plan: General   Post-op Pain Management:  Regional for Post-op pain   Induction: Intravenous  PONV Risk Score and Plan: 2 and Ondansetron, Midazolam and Treatment may vary due to age or medical condition  Airway Management Planned: LMA  Additional Equipment:   Intra-op Plan:   Post-operative Plan: Extubation in OR  Informed Consent: I have reviewed the patients History and Physical, chart, labs and discussed the procedure including the risks, benefits and alternatives for the proposed anesthesia with the patient or authorized representative who has indicated his/her understanding and acceptance.     Dental advisory given  Plan Discussed with: CRNA, Anesthesiologist and Surgeon  Anesthesia Plan Comments:         Anesthesia Quick Evaluation

## 2019-12-12 NOTE — Brief Op Note (Signed)
12/12/2019  10:24 AM  PATIENT:  Erica Richardson  69 y.o. female  PRE-OPERATIVE DIAGNOSIS:  right shoulder rotator cuff tear  POST-OPERATIVE DIAGNOSIS:  RIGHT SHOULDER IMPINGEMENT  PROCEDURE:  Procedure(s): SHOULDER ARTHROSCOPY WITH SUBACROMIAL DECOMPRESSION WITH EXTENSIVE DEBRIDEMENT (Right)  SURGEON:  Surgeon(s) and Role:    * Mcarthur Rossetti, MD - Primary  PHYSICIAN ASSISTANT:  Benita Stabile, PA-C  ANESTHESIA:   regional and general  COUNTS:  YES  DICTATION: .Other Dictation: Dictation Number [id number]  PLAN OF CARE: Discharge to home after PACU  PATIENT DISPOSITION:  PACU - hemodynamically stable.   Delay start of Pharmacological VTE agent (>24hrs) due to surgical blood loss or risk of bleeding: no

## 2019-12-12 NOTE — Progress Notes (Signed)
Assisted Dr. Miller with right, ultrasound guided, interscalene  block. Side rails up, monitors on throughout procedure. See vital signs in flow sheet. Tolerated Procedure well. 
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
## 2019-12-13 ENCOUNTER — Encounter (HOSPITAL_BASED_OUTPATIENT_CLINIC_OR_DEPARTMENT_OTHER): Payer: Self-pay | Admitting: Orthopaedic Surgery

## 2019-12-19 ENCOUNTER — Encounter: Payer: Self-pay | Admitting: Orthopaedic Surgery

## 2019-12-19 ENCOUNTER — Ambulatory Visit (INDEPENDENT_AMBULATORY_CARE_PROVIDER_SITE_OTHER): Payer: Medicare Other | Admitting: Orthopaedic Surgery

## 2019-12-19 DIAGNOSIS — M75101 Unspecified rotator cuff tear or rupture of right shoulder, not specified as traumatic: Secondary | ICD-10-CM

## 2019-12-19 DIAGNOSIS — M25511 Pain in right shoulder: Secondary | ICD-10-CM

## 2019-12-19 DIAGNOSIS — Z9889 Other specified postprocedural states: Secondary | ICD-10-CM | POA: Insufficient documentation

## 2019-12-19 DIAGNOSIS — G8929 Other chronic pain: Secondary | ICD-10-CM

## 2019-12-19 NOTE — Progress Notes (Signed)
The patient is now 1 week status post a right shoulder arthroscopy.  We found only a partial-thickness rotator cuff tear of her shoulder but significant signs of impingement.  There was subacromial bone spurring and significant inflamed tissue of the rotator cuff with bursitis.  There was also inflamed tissue in the anterior capsule and the undersurface of the rotator cuff.  We are able to debride the partial-thickness rotator cuff tear as well as perform a subacromial decompression.  I did share with the patient today the arthroscopy pictures.  She reports that she is doing well overall.  We did remove the sutures from the 3 portal sites from her right shoulder and they look good overall.  She has an intact axillary nerve and the rotator cuff is intact with her ability to abduct her shoulder.  I did show her several shoulder exercises to try.  I would like to see her back in 4 weeks after she tries his exercises twice daily on both her shoulders.  All questions and concerns were answered and addressed.

## 2019-12-20 ENCOUNTER — Telehealth: Payer: Self-pay | Admitting: Orthopaedic Surgery

## 2019-12-20 ENCOUNTER — Other Ambulatory Visit: Payer: Self-pay | Admitting: Physical Medicine and Rehabilitation

## 2019-12-20 MED ORDER — HYDROCODONE-ACETAMINOPHEN 5-325 MG PO TABS
1.0000 | ORAL_TABLET | Freq: Four times a day (QID) | ORAL | 0 refills | Status: DC | PRN
Start: 2019-12-20 — End: 2020-08-20

## 2019-12-20 NOTE — Telephone Encounter (Signed)
Patient called requesting a refill of hydrocodone. Please send to pharmacy on file. Patient phone number is [phone].

## 2019-12-20 NOTE — Telephone Encounter (Signed)
Please advise 

## 2019-12-23 NOTE — Telephone Encounter (Signed)
Please advise 

## 2019-12-25 NOTE — Telephone Encounter (Signed)
Closing encounter

## 2020-01-06 DIAGNOSIS — Z08 Encounter for follow-up examination after completed treatment for malignant neoplasm: Secondary | ICD-10-CM | POA: Diagnosis not present

## 2020-01-06 DIAGNOSIS — Z1283 Encounter for screening for malignant neoplasm of skin: Secondary | ICD-10-CM | POA: Diagnosis not present

## 2020-01-06 DIAGNOSIS — Z85828 Personal history of other malignant neoplasm of skin: Secondary | ICD-10-CM | POA: Diagnosis not present

## 2020-01-06 DIAGNOSIS — D485 Neoplasm of uncertain behavior of skin: Secondary | ICD-10-CM | POA: Diagnosis not present

## 2020-01-06 DIAGNOSIS — D225 Melanocytic nevi of trunk: Secondary | ICD-10-CM | POA: Diagnosis not present

## 2020-01-06 DIAGNOSIS — L821 Other seborrheic keratosis: Secondary | ICD-10-CM | POA: Diagnosis not present

## 2020-01-11 ENCOUNTER — Other Ambulatory Visit: Payer: Self-pay | Admitting: Orthopaedic Surgery

## 2020-01-13 NOTE — Telephone Encounter (Signed)
Please advise 

## 2020-01-20 ENCOUNTER — Ambulatory Visit: Payer: Medicare Other | Admitting: Orthopaedic Surgery

## 2020-01-20 ENCOUNTER — Ambulatory Visit: Payer: Self-pay

## 2020-01-20 ENCOUNTER — Other Ambulatory Visit: Payer: Self-pay

## 2020-01-20 ENCOUNTER — Ambulatory Visit (INDEPENDENT_AMBULATORY_CARE_PROVIDER_SITE_OTHER): Payer: Medicare Other | Admitting: Family Medicine

## 2020-01-20 ENCOUNTER — Encounter: Payer: Self-pay | Admitting: Family Medicine

## 2020-01-20 DIAGNOSIS — M25511 Pain in right shoulder: Secondary | ICD-10-CM

## 2020-01-20 MED ORDER — BACLOFEN 10 MG PO TABS
5.0000 mg | ORAL_TABLET | Freq: Three times a day (TID) | ORAL | 3 refills | Status: DC | PRN
Start: 2020-01-20 — End: 2020-04-07

## 2020-01-20 MED ORDER — DICLOFENAC SODIUM 1 % EX GEL: 4.0000 g | Freq: Four times a day (QID) | CUTANEOUS | 6 refills | Status: DC | PRN

## 2020-01-20 MED ORDER — TRAMADOL HCL 50 MG PO TABS: 50.0000 mg | ORAL_TABLET | Freq: Four times a day (QID) | ORAL | 0 refills | Status: DC | PRN
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
## 2020-01-29 DIAGNOSIS — Z0000 Encounter for general adult medical examination without abnormal findings: Secondary | ICD-10-CM | POA: Diagnosis not present

## 2020-01-29 DIAGNOSIS — J449 Chronic obstructive pulmonary disease, unspecified: Secondary | ICD-10-CM | POA: Diagnosis not present

## 2020-01-29 DIAGNOSIS — E785 Hyperlipidemia, unspecified: Secondary | ICD-10-CM | POA: Diagnosis not present

## 2020-01-29 DIAGNOSIS — Z8589 Personal history of malignant neoplasm of other organs and systems: Secondary | ICD-10-CM | POA: Diagnosis not present

## 2020-01-29 DIAGNOSIS — E538 Deficiency of other specified B group vitamins: Secondary | ICD-10-CM | POA: Diagnosis not present

## 2020-01-29 DIAGNOSIS — F17200 Nicotine dependence, unspecified, uncomplicated: Secondary | ICD-10-CM | POA: Diagnosis not present

## 2020-01-29 DIAGNOSIS — Z79899 Other long term (current) drug therapy: Secondary | ICD-10-CM | POA: Diagnosis not present

## 2020-01-29 DIAGNOSIS — M8580 Other specified disorders of bone density and structure, unspecified site: Secondary | ICD-10-CM | POA: Diagnosis not present

## 2020-01-29 DIAGNOSIS — G2581 Restless legs syndrome: Secondary | ICD-10-CM | POA: Diagnosis not present

## 2020-01-29 DIAGNOSIS — K219 Gastro-esophageal reflux disease without esophagitis: Secondary | ICD-10-CM | POA: Diagnosis not present

## 2020-01-29 DIAGNOSIS — Z87442 Personal history of urinary calculi: Secondary | ICD-10-CM | POA: Diagnosis not present

## 2020-01-29 DIAGNOSIS — F338 Other recurrent depressive disorders: Secondary | ICD-10-CM | POA: Diagnosis not present

## 2020-01-30 ENCOUNTER — Ambulatory Visit: Payer: Medicare Other | Admitting: Orthopaedic Surgery

## 2020-02-06 DIAGNOSIS — Z23 Encounter for immunization: Secondary | ICD-10-CM | POA: Diagnosis not present

## 2020-02-10 ENCOUNTER — Other Ambulatory Visit: Payer: Self-pay | Admitting: Family Medicine

## 2020-02-20 DIAGNOSIS — Z20828 Contact with and (suspected) exposure to other viral communicable diseases: Secondary | ICD-10-CM | POA: Diagnosis not present

## 2020-02-21 ENCOUNTER — Other Ambulatory Visit: Payer: Self-pay | Admitting: Family Medicine

## 2020-02-27 ENCOUNTER — Other Ambulatory Visit: Payer: Self-pay | Admitting: *Deleted

## 2020-02-27 DIAGNOSIS — F17210 Nicotine dependence, cigarettes, uncomplicated: Secondary | ICD-10-CM

## 2020-02-27 DIAGNOSIS — Z87891 Personal history of nicotine dependence: Secondary | ICD-10-CM

## 2020-03-06 ENCOUNTER — Telehealth: Payer: Self-pay | Admitting: Orthopaedic Surgery

## 2020-03-06 DIAGNOSIS — B342 Coronavirus infection, unspecified: Secondary | ICD-10-CM | POA: Diagnosis not present

## 2020-03-06 NOTE — Telephone Encounter (Signed)
Records 12/12/19-present faxed [place] P.T [phone]

## 2020-03-08 ENCOUNTER — Other Ambulatory Visit: Payer: Self-pay | Admitting: Family Medicine

## 2020-03-11 DIAGNOSIS — M25511 Pain in right shoulder: Secondary | ICD-10-CM | POA: Diagnosis not present

## 2020-03-11 DIAGNOSIS — R531 Weakness: Secondary | ICD-10-CM | POA: Diagnosis not present

## 2020-03-16 DIAGNOSIS — R531 Weakness: Secondary | ICD-10-CM | POA: Diagnosis not present

## 2020-03-16 DIAGNOSIS — M25511 Pain in right shoulder: Secondary | ICD-10-CM | POA: Diagnosis not present

## 2020-03-16 DIAGNOSIS — H40013 Open angle with borderline findings, low risk, bilateral: Secondary | ICD-10-CM | POA: Diagnosis not present

## 2020-03-16 DIAGNOSIS — H3540 Unspecified peripheral retinal degeneration: Secondary | ICD-10-CM | POA: Diagnosis not present

## 2020-03-18 ENCOUNTER — Other Ambulatory Visit: Payer: Self-pay

## 2020-03-18 ENCOUNTER — Ambulatory Visit
Admission: RE | Admit: 2020-03-18 | Discharge: 2020-03-18 | Disposition: A | Discharge status: Home / Self Care | Payer: Medicare Other | Source: Ambulatory Visit | Attending: Acute Care | Admitting: Acute Care

## 2020-03-18 DIAGNOSIS — F17210 Nicotine dependence, cigarettes, uncomplicated: Secondary | ICD-10-CM

## 2020-03-18 DIAGNOSIS — R918 Other nonspecific abnormal finding of lung field: Secondary | ICD-10-CM | POA: Diagnosis not present

## 2020-03-18 DIAGNOSIS — J432 Centrilobular emphysema: Secondary | ICD-10-CM | POA: Diagnosis not present

## 2020-03-18 DIAGNOSIS — Z87891 Personal history of nicotine dependence: Secondary | ICD-10-CM

## 2020-03-19 DIAGNOSIS — M25511 Pain in right shoulder: Secondary | ICD-10-CM | POA: Diagnosis not present

## 2020-03-19 DIAGNOSIS — R531 Weakness: Secondary | ICD-10-CM | POA: Diagnosis not present

## 2020-03-24 DIAGNOSIS — M25511 Pain in right shoulder: Secondary | ICD-10-CM | POA: Diagnosis not present

## 2020-03-24 DIAGNOSIS — R531 Weakness: Secondary | ICD-10-CM | POA: Diagnosis not present

## 2020-03-26 NOTE — Progress Notes (Signed)
Please call patient and let them  know their  low dose Ct was read as a Lung RADS 2: nodules that are benign in appearance and behavior with a very low likelihood of becoming a clinically active cancer due to size or lack of growth. Recommendation per radiology is for a repeat LDCT in 12 months. .Pt.  is currently  currently on statin therapy. Please place order for annual  screening scan for  02/2021 and fax results to PCP. Thanks so much.

## 2020-03-27 ENCOUNTER — Other Ambulatory Visit: Payer: Self-pay | Admitting: Family Medicine

## 2020-03-27 ENCOUNTER — Ambulatory Visit (AMBULATORY_SURGERY_CENTER): Payer: Medicare Other

## 2020-03-27 VITALS — Ht 64.0 in | Wt 145.0 lb

## 2020-03-27 DIAGNOSIS — Z86010 Personal history of colonic polyps: Secondary | ICD-10-CM

## 2020-03-27 DIAGNOSIS — R197 Diarrhea, unspecified: Secondary | ICD-10-CM

## 2020-03-27 MED ORDER — PLENVU 140 G PO SOLR: 1.0000 | Freq: Once | ORAL | 0 refills | Status: DC

## 2020-03-27 MED ORDER — PLENVU 140 G PO SOLR: 1.0000 | Freq: Once | ORAL | 0 refills | Status: AC

## 2020-03-27 NOTE — Progress Notes (Signed)
No egg or soy allergy known to patient  No issues with past sedation with any surgeries or procedures No intubation problems in the past  No FH of Malignant Hyperthermia No diet pills per patient No home 02 use per patient  No blood thinners per patient  Pt denies issues with constipation  No A fib or A flutter  EMMI video to pt or via [place] 19 guidelines implemented in PV today with Pt and RN  Pt is fully vaccinated  for Covid   Virtual Pre-Visit  Plenvu  Coupon given to pt in PV today , Code to Pharmacy and  NO PA's for preps discussed with pt  In PV today   Due to the COVID-19 pandemic we are asking patients to follow certain guidelines.  Pt aware of COVID protocols and [facility] guidelines

## 2020-03-30 ENCOUNTER — Other Ambulatory Visit: Payer: Self-pay | Admitting: *Deleted

## 2020-03-30 DIAGNOSIS — Z87891 Personal history of nicotine dependence: Secondary | ICD-10-CM

## 2020-03-30 DIAGNOSIS — F17210 Nicotine dependence, cigarettes, uncomplicated: Secondary | ICD-10-CM

## 2020-04-06 ENCOUNTER — Other Ambulatory Visit: Payer: Self-pay | Admitting: Family Medicine

## 2020-04-07 ENCOUNTER — Other Ambulatory Visit: Payer: Self-pay | Admitting: Family Medicine

## 2020-04-08 ENCOUNTER — Encounter: Payer: Self-pay | Admitting: Orthopaedic Surgery

## 2020-04-08 ENCOUNTER — Ambulatory Visit (INDEPENDENT_AMBULATORY_CARE_PROVIDER_SITE_OTHER): Payer: Medicare Other | Admitting: Orthopaedic Surgery

## 2020-04-08 DIAGNOSIS — G8929 Other chronic pain: Secondary | ICD-10-CM | POA: Diagnosis not present

## 2020-04-08 DIAGNOSIS — M75101 Unspecified rotator cuff tear or rupture of right shoulder, not specified as traumatic: Secondary | ICD-10-CM

## 2020-04-08 DIAGNOSIS — M25511 Pain in right shoulder: Secondary | ICD-10-CM | POA: Diagnosis not present

## 2020-04-08 DIAGNOSIS — Z9889 Other specified postprocedural states: Secondary | ICD-10-CM | POA: Diagnosis not present

## 2020-04-08 MED ORDER — METHYLPREDNISOLONE ACETATE 40 MG/ML IJ SUSP
40.0000 mg | INTRAMUSCULAR | Status: AC | PRN
  Administered 2020-04-08: 40 mg via INTRA_ARTICULAR

## 2020-04-08 MED ORDER — LIDOCAINE HCL 1 % IJ SOLN
3.0000 mL | INTRAMUSCULAR | Status: AC | PRN
  Administered 2020-04-08: 3 mL
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
## 2020-04-10 ENCOUNTER — Encounter: Payer: Medicare Other | Admitting: Gastroenterology

## 2020-04-18 ENCOUNTER — Other Ambulatory Visit: Payer: Self-pay | Admitting: Family Medicine

## 2020-05-18 ENCOUNTER — Other Ambulatory Visit: Payer: Self-pay | Admitting: Family Medicine

## 2020-05-26 DIAGNOSIS — Z1231 Encounter for screening mammogram for malignant neoplasm of breast: Secondary | ICD-10-CM | POA: Diagnosis not present

## 2020-06-01 ENCOUNTER — Encounter: Payer: Self-pay | Admitting: Gastroenterology

## 2020-06-01 ENCOUNTER — Ambulatory Visit (AMBULATORY_SURGERY_CENTER): Payer: Medicare Other | Admitting: Gastroenterology

## 2020-06-01 ENCOUNTER — Other Ambulatory Visit: Payer: Self-pay | Admitting: Family Medicine

## 2020-06-01 ENCOUNTER — Other Ambulatory Visit: Payer: Self-pay

## 2020-06-01 VITALS — BP 131/62 | HR 63 | Temp 98.3°F | Resp 15 | Ht 64.0 in | Wt 145.0 lb

## 2020-06-01 DIAGNOSIS — E7800 Pure hypercholesterolemia, unspecified: Secondary | ICD-10-CM | POA: Diagnosis not present

## 2020-06-01 DIAGNOSIS — K641 Second degree hemorrhoids: Secondary | ICD-10-CM

## 2020-06-01 DIAGNOSIS — J449 Chronic obstructive pulmonary disease, unspecified: Secondary | ICD-10-CM | POA: Diagnosis not present

## 2020-06-01 DIAGNOSIS — R197 Diarrhea, unspecified: Secondary | ICD-10-CM

## 2020-06-01 DIAGNOSIS — Z86010 Personal history of colonic polyps: Secondary | ICD-10-CM

## 2020-06-01 DIAGNOSIS — D125 Benign neoplasm of sigmoid colon: Secondary | ICD-10-CM

## 2020-06-01 DIAGNOSIS — K635 Polyp of colon: Secondary | ICD-10-CM | POA: Diagnosis not present

## 2020-06-01 MED ORDER — SODIUM CHLORIDE 0.9 % IV SOLN: 500.0000 mL | Freq: Once | INTRAVENOUS | Status: DC

## 2020-06-01 NOTE — Progress Notes (Signed)
Called to room to assist during endoscopic procedure.  Patient ID and intended procedure confirmed with present staff. Received instructions for my participation in the procedure from the performing physician.  

## 2020-06-01 NOTE — Op Note (Signed)
[facility] Patient Name: Erica Richardson Procedure Date: 06/01/2020 7:20 AM MRN: [id number] Endoscopist: Gerrit Heck , MD Age: 70 Referring MD:  Date of Birth: 06/29/1950 Gender: Female Account #: [id number] Procedure:                Colonoscopy Indications:              Screening for colorectal malignant neoplasm (last                            colonoscopy was 10 years ago)                           -Colonoscopy in 10/2003 with rectal polyp                           -Colonoscopy in 01/2010 was normal                           She does report a history of relapsing non-bloody                            diarrhea. Patient Profile:          This is a 70 year old female. Medicines:                Monitored Anesthesia Care Procedure:                Pre-Anesthesia Assessment:                           - Prior to the procedure, a History and Physical                            was performed, and patient medications and                            allergies were reviewed. The patient's tolerance of                            previous anesthesia was also reviewed. The risks                            and benefits of the procedure and the sedation                            options and risks were discussed with the patient.                            All questions were answered, and informed consent                            was obtained. Prior Anticoagulants: The patient has                            taken no previous anticoagulant or antiplatelet  agents. After reviewing the risks and benefits, the                            patient was deemed in satisfactory condition to                            undergo the procedure.                           After obtaining informed consent, the colonoscope                            was passed under direct vision. Throughout the                            procedure, the patient's blood pressure, pulse, and                             oxygen saturations were monitored continuously. The                            Colonoscope was introduced through the anus and                            advanced to the the cecum, identified by                            appendiceal orifice and ileocecal valve. The                            colonoscopy was performed without difficulty. The                            patient tolerated the procedure well. The quality                            of the bowel preparation was good. The ileocecal                            valve, appendiceal orifice, and rectum were                            photographed. Scope In: 8:17:02 AM Scope Out: 8:37:47 AM Scope Withdrawal Time: 0 hours 15 minutes 19 seconds  Total Procedure Duration: 0 hours 20 minutes 45 seconds  Findings:                 Hemorrhoids were found on perianal exam.                           Two sessile polyps were found in the sigmoid colon.                            The polyps were 2 to 3 mm in size. These polyps  were removed with a cold biopsy forceps. Resection                            and retrieval were complete. Estimated blood loss                            was minimal.                           Normal mucosa was found in the remainder of the                            colon. Biopsies for histology were taken with a                            cold forceps from the right colon and left colon                            for evaluation of microscopic colitis. Estimated                            blood loss was minimal.                           Non-bleeding internal hemorrhoids were found during                            retroflexion. The hemorrhoids were medium-sized and                            Grade II (internal hemorrhoids that prolapse but                            reduce spontaneously). Complications:            No immediate complications. Estimated Blood Loss:      Estimated blood loss was minimal. Impression:               - Hemorrhoids found on perianal exam.                           - Two 2 to 3 mm polyps in the sigmoid colon,                            removed with a cold biopsy forceps. Resected and                            retrieved.                           - Normal mucosa in the entire examined colon.                            Biopsied.                           -  Non-bleeding internal hemorrhoids. Recommendation:           - Patient has a contact number available for                            emergencies. The signs and symptoms of potential                            delayed complications were discussed with the                            patient. Return to normal activities tomorrow.                            Written discharge instructions were provided to the                            patient.                           - Resume previous diet.                           - Continue present medications.                           - Await pathology results.                           - Repeat colonoscopy for surveillance based on                            pathology results.                           - Return to GI clinic PRN.                           - Use fiber, for example Citrucel, Fibercon, Konsyl                            or Metamucil.                           - Internal hemorrhoids were noted on this study and                            may be amenable to hemorrhoid band ligation. If you                            are interested in further treatment of these                            hemorrhoids with band ligation, please contact my                            clinic to set  up an appointment for evaluation and                            treatment. Gerrit Heck, MD 06/01/2020 8:46:47 AM

## 2020-06-01 NOTE — Patient Instructions (Signed)
Please read handouts provided. Continue present medications. Await pathology results. Return to GI clinic as needed. Consider a fiber supplement.     YOU HAD AN ENDOSCOPIC PROCEDURE TODAY AT [facility]:   Refer to the procedure report that was given to you for any specific questions about what was found during the examination.  If the procedure report does not answer your questions, please call your gastroenterologist to clarify.  If you requested that your care partner not be given the details of your procedure findings, then the procedure report has been included in a sealed envelope for you to review at your convenience later.  YOU SHOULD EXPECT: Some feelings of bloating in the abdomen. Passage of more gas than usual.  Walking can help get rid of the air that was put into your GI tract during the procedure and reduce the bloating. If you had a lower endoscopy (such as a colonoscopy or flexible sigmoidoscopy) you may notice spotting of blood in your stool or on the toilet paper. If you underwent a bowel prep for your procedure, you may not have a normal bowel movement for a few days.  Please Note:  You might notice some irritation and congestion in your nose or some drainage.  This is from the oxygen used during your procedure.  There is no need for concern and it should clear up in a day or so.  SYMPTOMS TO REPORT IMMEDIATELY:   Following lower endoscopy (colonoscopy or flexible sigmoidoscopy):  Excessive amounts of blood in the stool  Significant tenderness or worsening of abdominal pains  Swelling of the abdomen that is new, acute  Fever of 100F or higher   For urgent or emergent issues, a gastroenterologist can be reached at any hour by calling [phone]. Do not use MyChart messaging for urgent concerns.    DIET:  We do recommend a small meal at first, but then you may proceed to your regular diet.  Drink plenty of fluids but you should avoid alcoholic  beverages for 24 hours.  ACTIVITY:  You should plan to take it easy for the rest of today and you should NOT DRIVE or use heavy machinery until tomorrow (because of the sedation medicines used during the test).    FOLLOW UP: Our staff will call the number listed on your records 48-72 hours following your procedure to check on you and address any questions or concerns that you may have regarding the information given to you following your procedure. If we do not reach you, we will leave a message.  We will attempt to reach you two times.  During this call, we will ask if you have developed any symptoms of COVID 19. If you develop any symptoms (ie: fever, flu-like symptoms, shortness of breath, cough etc.) before then, please call [phone].  If you test positive for Covid 19 in the 2 weeks post procedure, please call and report this information to [name].    If any biopsies were taken you will be contacted by phone or by letter within the next 1-3 weeks.  Please call us at [phone] if you have not heard about the biopsies in 3 weeks.    SIGNATURES/CONFIDENTIALITY: You and/or your care partner have signed paperwork which will be entered into your electronic medical record.  These signatures attest to the fact that that the information above on your After Visit Summary has been reviewed and is understood.  Full responsibility of the confidentiality of this discharge information  lies with you and/or your care-partner.

## 2020-06-01 NOTE — Progress Notes (Signed)
Vitals-CW  Pt's states no medical or surgical changes since previsit or office visit. 

## 2020-06-01 NOTE — Progress Notes (Signed)
Report given to PACU, vss 

## 2020-06-01 NOTE — Progress Notes (Signed)
Patient complaining of right inner thigh pain upon discharge. Able to walk but moving slowly to chair. Warm compress given to patient to apply to area. Patient encouraged to use warm compress to area today when she gets home. Patient given number to call if pain gets worse. Dr. Bryan Lemma notified of patient's complaint. B.Bowie Doiron RN.

## 2020-06-03 ENCOUNTER — Telehealth: Payer: Self-pay | Admitting: *Deleted

## 2020-06-03 NOTE — Telephone Encounter (Signed)
  Follow up Call-  Call back number 06/01/2020  Post procedure Call Back phone  # [phone]  Permission to leave phone message Yes  Some recent data might be hidden     Patient questions:  Do you have a fever, pain , or abdominal swelling? No. Pain Score  0 *  Have you tolerated food without any problems? Yes.    Have you been able to return to your normal activities? Yes.    Do you have any questions about your discharge instructions: Diet   No. Medications  No. Follow up visit  No.  Do you have questions or concerns about your Care? No.  Actions: * If pain score is 4 or above: 1. No action needed, pain <4.Have you developed a fever since your procedure? no  2.   Have you had an respiratory symptoms (SOB or cough) since your procedure? no  3.   Have you tested positive for COVID 19 since your procedure no  4.   Have you had any family members/close contacts diagnosed with the COVID 19 since your procedure?  no   If yes to any of these questions please route to Joylene John, RN and Joella Prince, RN

## 2020-06-07 ENCOUNTER — Other Ambulatory Visit: Payer: Self-pay | Admitting: Family Medicine

## 2020-06-08 ENCOUNTER — Telehealth: Payer: Self-pay | Admitting: Orthopaedic Surgery

## 2020-06-08 ENCOUNTER — Ambulatory Visit: Payer: Medicare Other | Admitting: Orthopaedic Surgery

## 2020-06-08 NOTE — Telephone Encounter (Signed)
Patient calling wondering why her Tramadol was refused?

## 2020-06-08 NOTE — Telephone Encounter (Signed)
Patient called requesting a call back from Dr. Ninfa Linden. Patient has questions about medication. Please call patient at [phone].

## 2020-06-08 NOTE — Telephone Encounter (Signed)
LMOM for patient of the below message  

## 2020-06-08 NOTE — Telephone Encounter (Signed)
Not medication is a narcotic.  It has been 6 months since we did surgery on her shoulder at this point we really need to have patient off narcotics.  We could always refer her to chronic pain management if she would like.

## 2020-06-12 ENCOUNTER — Encounter: Payer: Self-pay | Admitting: Gastroenterology

## 2020-06-18 ENCOUNTER — Ambulatory Visit (INDEPENDENT_AMBULATORY_CARE_PROVIDER_SITE_OTHER): Payer: Medicare Other | Admitting: Orthopaedic Surgery

## 2020-06-18 ENCOUNTER — Encounter: Payer: Self-pay | Admitting: Orthopaedic Surgery

## 2020-06-18 DIAGNOSIS — M25511 Pain in right shoulder: Secondary | ICD-10-CM | POA: Diagnosis not present

## 2020-06-18 DIAGNOSIS — G8929 Other chronic pain: Secondary | ICD-10-CM | POA: Diagnosis not present

## 2020-06-18 DIAGNOSIS — M75101 Unspecified rotator cuff tear or rupture of right shoulder, not specified as traumatic: Secondary | ICD-10-CM | POA: Diagnosis not present

## 2020-06-18 DIAGNOSIS — Z9889 Other specified postprocedural states: Secondary | ICD-10-CM

## 2020-06-18 MED ORDER — TRAMADOL HCL 50 MG PO TABS: 50.0000 mg | ORAL_TABLET | Freq: Every evening | ORAL | 0 refills | Status: DC | PRN

## 2020-06-18 NOTE — Progress Notes (Signed)
The patient continues to have chronic right shoulder pain in spite of our extensive debridement 6 months ago with an arthroscopic intervention.  Even a steroid injection 2 months ago did not help her pain even a single day or hour at all.  Her shoulder moves smoothly and fluidly.  She says her pain is mainly at night that wakes her up.  She is requesting tramadol again.  She has been on this for a long period time and I encouraged her to be off of all narcotics.  At this point I am not sure what I can offer her.  Certainly getting a second opinion would be worthwhile.  I can also send her to chronic pain management.  Follow-up from my standpoint is as needed.  I will send in 1 more tramadol for her.

## 2020-06-19 ENCOUNTER — Other Ambulatory Visit: Payer: Self-pay

## 2020-06-19 DIAGNOSIS — G8929 Other chronic pain: Secondary | ICD-10-CM

## 2020-06-19 DIAGNOSIS — M79671 Pain in right foot: Secondary | ICD-10-CM

## 2020-06-19 DIAGNOSIS — M25511 Pain in right shoulder: Secondary | ICD-10-CM

## 2020-07-09 DIAGNOSIS — M810 Age-related osteoporosis without current pathological fracture: Secondary | ICD-10-CM | POA: Diagnosis not present

## 2020-08-03 ENCOUNTER — Encounter: Payer: Self-pay | Admitting: Orthopaedic Surgery

## 2020-08-03 ENCOUNTER — Ambulatory Visit (INDEPENDENT_AMBULATORY_CARE_PROVIDER_SITE_OTHER): Payer: Medicare Other | Admitting: Orthopaedic Surgery

## 2020-08-03 DIAGNOSIS — M25511 Pain in right shoulder: Secondary | ICD-10-CM | POA: Diagnosis not present

## 2020-08-03 DIAGNOSIS — Z9889 Other specified postprocedural states: Secondary | ICD-10-CM | POA: Diagnosis not present

## 2020-08-03 DIAGNOSIS — G8929 Other chronic pain: Secondary | ICD-10-CM | POA: Diagnosis not present

## 2020-08-03 NOTE — Progress Notes (Signed)
The patient is still having considerable right shoulder pain 7 and half months after arthroscopic surgery on her right shoulder.  She feels like there may be a palpable mass in the axillary region and anterior shoulder.  At the time of surgery found the biceps tendon was basically gone and there was a full-thickness rotator cuff tear of a small area but we did not feel that this was repairable given the nature of her tissue.  We have tried conservative treatment for a long period time including injections and activity modification as well as therapy and is still considerably hurting for her.  On exam she has good motion of that right shoulder but is very painful and certainly weak.  I cannot palpate the mass that she is feeling but I have no doubt that she is feeling something there.  At this point I would like to send her for an MRI with contrast of the right shoulder to evaluate the soft tissue and any other pathology in the shoulder that may be causing her to have such severe pain.  She agrees with the treatment plan.  We will see her back after the MRI.

## 2020-08-04 ENCOUNTER — Other Ambulatory Visit: Payer: Self-pay

## 2020-08-04 DIAGNOSIS — F419 Anxiety disorder, unspecified: Secondary | ICD-10-CM | POA: Diagnosis not present

## 2020-08-04 DIAGNOSIS — R222 Localized swelling, mass and lump, trunk: Secondary | ICD-10-CM | POA: Diagnosis not present

## 2020-08-04 DIAGNOSIS — G8929 Other chronic pain: Secondary | ICD-10-CM

## 2020-08-04 DIAGNOSIS — Z01411 Encounter for gynecological examination (general) (routine) with abnormal findings: Secondary | ICD-10-CM | POA: Diagnosis not present

## 2020-08-15 ENCOUNTER — Encounter (HOSPITAL_BASED_OUTPATIENT_CLINIC_OR_DEPARTMENT_OTHER): Payer: Self-pay | Admitting: Emergency Medicine

## 2020-08-15 ENCOUNTER — Other Ambulatory Visit: Payer: Self-pay

## 2020-08-15 ENCOUNTER — Emergency Department (HOSPITAL_BASED_OUTPATIENT_CLINIC_OR_DEPARTMENT_OTHER)
Admission: EM | Admit: 2020-08-15 | Discharge: 2020-08-15 | Disposition: A | Discharge status: Home / Self Care | Payer: Medicare Other | Attending: Emergency Medicine | Admitting: Emergency Medicine

## 2020-08-15 DIAGNOSIS — M25511 Pain in right shoulder: Secondary | ICD-10-CM | POA: Diagnosis not present

## 2020-08-15 DIAGNOSIS — R600 Localized edema: Secondary | ICD-10-CM | POA: Diagnosis not present

## 2020-08-15 DIAGNOSIS — Z96641 Presence of right artificial hip joint: Secondary | ICD-10-CM | POA: Insufficient documentation

## 2020-08-15 DIAGNOSIS — Z7951 Long term (current) use of inhaled steroids: Secondary | ICD-10-CM | POA: Insufficient documentation

## 2020-08-15 DIAGNOSIS — R609 Edema, unspecified: Secondary | ICD-10-CM

## 2020-08-15 DIAGNOSIS — Z79899 Other long term (current) drug therapy: Secondary | ICD-10-CM | POA: Diagnosis not present

## 2020-08-15 DIAGNOSIS — J441 Chronic obstructive pulmonary disease with (acute) exacerbation: Secondary | ICD-10-CM | POA: Insufficient documentation

## 2020-08-15 DIAGNOSIS — F17210 Nicotine dependence, cigarettes, uncomplicated: Secondary | ICD-10-CM | POA: Insufficient documentation

## 2020-08-15 DIAGNOSIS — M25461 Effusion, right knee: Secondary | ICD-10-CM | POA: Diagnosis present
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
## 2020-08-15 NOTE — ED Triage Notes (Signed)
Reports swelling in bil ankles for the last four days.

## 2020-08-19 ENCOUNTER — Ambulatory Visit
Admission: RE | Admit: 2020-08-19 | Discharge: 2020-08-19 | Disposition: A | Discharge status: Home / Self Care | Payer: Medicare Other | Source: Ambulatory Visit | Attending: Orthopaedic Surgery | Admitting: Orthopaedic Surgery

## 2020-08-19 ENCOUNTER — Encounter: Payer: Self-pay | Admitting: Physical Medicine and Rehabilitation

## 2020-08-19 ENCOUNTER — Other Ambulatory Visit: Payer: Medicare Other

## 2020-08-19 ENCOUNTER — Other Ambulatory Visit: Payer: Self-pay

## 2020-08-19 DIAGNOSIS — G8929 Other chronic pain: Secondary | ICD-10-CM

## 2020-08-19 DIAGNOSIS — M75121 Complete rotator cuff tear or rupture of right shoulder, not specified as traumatic: Secondary | ICD-10-CM | POA: Diagnosis not present

## 2020-08-19 MED ORDER — GADOBENATE DIMEGLUMINE 529 MG/ML IV SOLN
13.0000 mL | Freq: Once | INTRAVENOUS | Status: AC | PRN
  Administered 2020-08-19: 13 mL via INTRAVENOUS

## 2020-08-20 ENCOUNTER — Other Ambulatory Visit: Payer: Self-pay | Admitting: Orthopaedic Surgery

## 2020-08-20 ENCOUNTER — Telehealth: Payer: Self-pay | Admitting: Orthopaedic Surgery

## 2020-08-20 ENCOUNTER — Other Ambulatory Visit: Payer: Self-pay | Admitting: Family Medicine

## 2020-08-20 MED ORDER — HYDROCODONE-ACETAMINOPHEN 5-325 MG PO TABS: 1.0000 | ORAL_TABLET | Freq: Four times a day (QID) | ORAL | 0 refills | Status: DC | PRN

## 2020-08-20 NOTE — Telephone Encounter (Signed)
Pt called wanting to speak to a nurse stating she is "in severe pain and grasping at straws" and wanted to know if he had any cancellations today or anytime before her 08/27/20 appt. Advised pt that he is booked up as well as his PA and her appt that day is the soonest they have available. The best call back number is  [phone].

## 2020-08-20 NOTE — Telephone Encounter (Signed)
Appt made

## 2020-08-27 ENCOUNTER — Other Ambulatory Visit: Payer: Self-pay

## 2020-08-27 ENCOUNTER — Ambulatory Visit (INDEPENDENT_AMBULATORY_CARE_PROVIDER_SITE_OTHER): Payer: Medicare Other | Admitting: Physician Assistant

## 2020-08-27 ENCOUNTER — Encounter: Payer: Self-pay | Admitting: Physician Assistant

## 2020-08-27 DIAGNOSIS — M75121 Complete rotator cuff tear or rupture of right shoulder, not specified as traumatic: Secondary | ICD-10-CM | POA: Diagnosis not present

## 2020-08-27 NOTE — Progress Notes (Signed)
HPI: Mrs. Erica Richardson returns today to go over the MRI of her right shoulder.  She is still having pain in the shoulder.  She states she cannot sleep well due to the pain in her shoulder.  She did undergo right shoulder arthroscopy packing in October due to the fact that the rotator cuff tear at that time was nonretracted however full-thickness and she had questionable bone quality and tissue quality no repair was performed.  The rotator cuff at that time did move as a unit.Patient only had mild glenoid arthritic changes at last arthroscopy.    She comes in today to go over the MRI of her right shoulder.  MRI images are shown.  Her supraspinatus tendon tear has enlarged from 0.7 cm to now retraction of 1.5 to 2.5 cm.  There is no evidence of muscle atrophy.  Impression: Right shoulder chronic pain Right shoulder rotator cuff tear  Plan: We will refer her to Dr. Marlou Sa for second opinion on possible repairing the rotator cuff tear.  Questions were encouraged and answered at length.  The copy of the MRI report was given to patient.

## 2020-09-02 ENCOUNTER — Telehealth: Payer: Self-pay | Admitting: Orthopaedic Surgery

## 2020-09-02 NOTE — Telephone Encounter (Signed)
Pt calling asking for another prescription stronger than hydrocodone that she currently has to hold her over until her appt next wed with Marlou Sa. Pt states she is in severe pain that is why she is calling an asking. The best pharmacy is the [facility] ([address], [address]) and the best call back number is [phone].

## 2020-09-02 NOTE — Telephone Encounter (Signed)
I would if I wasn't out of town until Tuesday.  Best bet would be keep Wednesday appt with Dr. Marlou Sa

## 2020-09-02 NOTE — Telephone Encounter (Signed)
If pt is willing,would you be ok to see the pt to discuss sx sooner since she is in sooo much pain?

## 2020-09-02 NOTE — Telephone Encounter (Signed)
Please advise 

## 2020-09-03 NOTE — Telephone Encounter (Signed)
Lvm for pt to cb to inform 

## 2020-09-09 ENCOUNTER — Ambulatory Visit (INDEPENDENT_AMBULATORY_CARE_PROVIDER_SITE_OTHER): Payer: Medicare Other | Admitting: Orthopedic Surgery

## 2020-09-09 ENCOUNTER — Other Ambulatory Visit: Payer: Self-pay

## 2020-09-09 ENCOUNTER — Encounter: Payer: Self-pay | Admitting: Orthopedic Surgery

## 2020-09-09 VITALS — Ht 64.0 in | Wt 150.0 lb

## 2020-09-09 DIAGNOSIS — M75101 Unspecified rotator cuff tear or rupture of right shoulder, not specified as traumatic: Secondary | ICD-10-CM

## 2020-09-09 MED ORDER — TRAMADOL HCL 50 MG PO TABS: ORAL_TABLET | ORAL | 0 refills | Status: DC

## 2020-09-13 ENCOUNTER — Encounter: Payer: Self-pay | Admitting: Orthopedic Surgery
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
## 2020-09-25 ENCOUNTER — Other Ambulatory Visit: Payer: Self-pay | Admitting: Physical Medicine and Rehabilitation

## 2020-09-25 NOTE — Telephone Encounter (Signed)
Please advise 

## 2020-09-26 ENCOUNTER — Other Ambulatory Visit: Payer: Self-pay | Admitting: Orthopedic Surgery

## 2020-10-09 ENCOUNTER — Other Ambulatory Visit: Payer: Self-pay | Admitting: Orthopedic Surgery

## 2020-10-09 MED ORDER — TRAMADOL HCL 50 MG PO TABS: 50.0000 mg | ORAL_TABLET | Freq: Four times a day (QID) | ORAL | 0 refills | Status: DC | PRN

## 2020-10-12 ENCOUNTER — Encounter: Payer: Self-pay | Admitting: Orthopedic Surgery

## 2020-10-12 DIAGNOSIS — M19011 Primary osteoarthritis, right shoulder: Secondary | ICD-10-CM | POA: Diagnosis not present

## 2020-10-12 DIAGNOSIS — M75121 Complete rotator cuff tear or rupture of right shoulder, not specified as traumatic: Secondary | ICD-10-CM | POA: Diagnosis not present

## 2020-10-12 DIAGNOSIS — G8918 Other acute postprocedural pain: Secondary | ICD-10-CM | POA: Diagnosis not present

## 2020-10-12 DIAGNOSIS — M24011 Loose body in right shoulder: Secondary | ICD-10-CM | POA: Diagnosis not present

## 2020-10-12 DIAGNOSIS — M7521 Bicipital tendinitis, right shoulder: Secondary | ICD-10-CM | POA: Diagnosis not present

## 2020-10-12 DIAGNOSIS — M94211 Chondromalacia, right shoulder: Secondary | ICD-10-CM | POA: Diagnosis not present

## 2020-10-19 ENCOUNTER — Ambulatory Visit (INDEPENDENT_AMBULATORY_CARE_PROVIDER_SITE_OTHER): Payer: Medicare Other | Admitting: Orthopedic Surgery

## 2020-10-19 ENCOUNTER — Other Ambulatory Visit: Payer: Self-pay

## 2020-10-19 ENCOUNTER — Encounter: Payer: Self-pay | Admitting: Orthopedic Surgery

## 2020-10-19 DIAGNOSIS — M75121 Complete rotator cuff tear or rupture of right shoulder, not specified as traumatic: Secondary | ICD-10-CM

## 2020-10-19 MED ORDER — HYDROCODONE-ACETAMINOPHEN 5-325 MG PO TABS: 1.0000 | ORAL_TABLET | Freq: Four times a day (QID) | ORAL | 0 refills | Status: DC | PRN
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
## 2020-11-04 ENCOUNTER — Other Ambulatory Visit: Payer: Self-pay

## 2020-11-04 ENCOUNTER — Ambulatory Visit (INDEPENDENT_AMBULATORY_CARE_PROVIDER_SITE_OTHER): Payer: Medicare Other | Admitting: Orthopedic Surgery

## 2020-11-04 DIAGNOSIS — M75121 Complete rotator cuff tear or rupture of right shoulder, not specified as traumatic: Secondary | ICD-10-CM

## 2020-11-04 DIAGNOSIS — M25512 Pain in left shoulder: Secondary | ICD-10-CM

## 2020-11-04 DIAGNOSIS — G8929 Other chronic pain: Secondary | ICD-10-CM

## 2020-11-08 ENCOUNTER — Encounter: Payer: Self-pay | Admitting: Orthopedic Surgery

## 2020-11-08 MED ORDER — BUPIVACAINE HCL 0.5 % IJ SOLN
9.0000 mL | INTRAMUSCULAR | Status: AC | PRN
  Administered 2020-11-04: 9 mL via INTRA_ARTICULAR

## 2020-11-08 MED ORDER — LIDOCAINE HCL 1 % IJ SOLN
5.0000 mL | INTRAMUSCULAR | Status: AC | PRN
  Administered 2020-11-04: 5 mL

## 2020-11-08 MED ORDER — METHYLPREDNISOLONE ACETATE 40 MG/ML IJ SUSP
40.0000 mg | INTRAMUSCULAR | Status: AC | PRN
  Administered 2020-11-04: 40 mg via INTRA_ARTICULAR
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
## 2020-11-09 ENCOUNTER — Other Ambulatory Visit: Payer: Self-pay | Admitting: Orthopedic Surgery

## 2020-11-10 ENCOUNTER — Other Ambulatory Visit: Payer: Self-pay

## 2020-11-10 ENCOUNTER — Telehealth: Payer: Self-pay

## 2020-11-10 DIAGNOSIS — M75121 Complete rotator cuff tear or rupture of right shoulder, not specified as traumatic: Secondary | ICD-10-CM | POA: Diagnosis not present

## 2020-11-10 NOTE — Telephone Encounter (Signed)
Faxed

## 2020-11-10 NOTE — Telephone Encounter (Signed)
Okay for active and passive ROM and RTC strengthening exercises but no bicep strengthening exercises until 2 weeks from today

## 2020-11-10 NOTE — Telephone Encounter (Signed)
PT called asking for outpatient PT order S/p shoulder scope Any restrictions?  [phone]

## 2020-11-12 DIAGNOSIS — M75121 Complete rotator cuff tear or rupture of right shoulder, not specified as traumatic: Secondary | ICD-10-CM | POA: Diagnosis not present

## 2020-11-17 DIAGNOSIS — M75121 Complete rotator cuff tear or rupture of right shoulder, not specified as traumatic: Secondary | ICD-10-CM | POA: Diagnosis not present

## 2020-11-19 DIAGNOSIS — M75121 Complete rotator cuff tear or rupture of right shoulder, not specified as traumatic: Secondary | ICD-10-CM | POA: Diagnosis not present

## 2020-11-23 ENCOUNTER — Other Ambulatory Visit: Payer: Self-pay | Admitting: Surgical

## 2020-11-23 DIAGNOSIS — M75121 Complete rotator cuff tear or rupture of right shoulder, not specified as traumatic: Secondary | ICD-10-CM

## 2020-11-24 DIAGNOSIS — M75121 Complete rotator cuff tear or rupture of right shoulder, not specified as traumatic: Secondary | ICD-10-CM | POA: Diagnosis not present

## 2020-11-25 ENCOUNTER — Ambulatory Visit (INDEPENDENT_AMBULATORY_CARE_PROVIDER_SITE_OTHER): Payer: Medicare Other | Admitting: Orthopedic Surgery

## 2020-11-25 ENCOUNTER — Encounter: Payer: Self-pay | Admitting: Orthopedic Surgery

## 2020-11-25 ENCOUNTER — Other Ambulatory Visit: Payer: Self-pay

## 2020-11-25 DIAGNOSIS — M75121 Complete rotator cuff tear or rupture of right shoulder, not specified as traumatic: Secondary | ICD-10-CM

## 2020-11-25 MED ORDER — BACLOFEN 10 MG PO TABS: 10.0000 mg | ORAL_TABLET | Freq: Three times a day (TID) | ORAL | 0 refills | Status: DC

## 2020-11-25 MED ORDER — TRAMADOL HCL 50 MG PO TABS: 50.0000 mg | ORAL_TABLET | Freq: Two times a day (BID) | ORAL | 0 refills | Status: DC | PRN

## 2020-11-26 DIAGNOSIS — M75121 Complete rotator cuff tear or rupture of right shoulder, not specified as traumatic: Secondary | ICD-10-CM | POA: Diagnosis not present

## 2020-11-28 ENCOUNTER — Encounter: Payer: Self-pay | Admitting: Orthopedic Surgery
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
## 2020-12-01 DIAGNOSIS — M75121 Complete rotator cuff tear or rupture of right shoulder, not specified as traumatic: Secondary | ICD-10-CM | POA: Diagnosis not present

## 2020-12-08 DIAGNOSIS — M75121 Complete rotator cuff tear or rupture of right shoulder, not specified as traumatic: Secondary | ICD-10-CM | POA: Diagnosis not present

## 2020-12-10 DIAGNOSIS — M75121 Complete rotator cuff tear or rupture of right shoulder, not specified as traumatic: Secondary | ICD-10-CM | POA: Diagnosis not present

## 2020-12-14 ENCOUNTER — Other Ambulatory Visit: Payer: Self-pay | Admitting: Surgical

## 2020-12-16 DIAGNOSIS — M75121 Complete rotator cuff tear or rupture of right shoulder, not specified as traumatic: Secondary | ICD-10-CM | POA: Diagnosis not present

## 2020-12-17 DIAGNOSIS — Z23 Encounter for immunization: Secondary | ICD-10-CM | POA: Diagnosis not present

## 2020-12-25 DIAGNOSIS — M75121 Complete rotator cuff tear or rupture of right shoulder, not specified as traumatic: Secondary | ICD-10-CM | POA: Diagnosis not present

## 2020-12-29 DIAGNOSIS — M75121 Complete rotator cuff tear or rupture of right shoulder, not specified as traumatic: Secondary | ICD-10-CM | POA: Diagnosis not present

## 2020-12-31 ENCOUNTER — Other Ambulatory Visit: Payer: Self-pay | Admitting: Surgical

## 2021-01-01 DIAGNOSIS — M75121 Complete rotator cuff tear or rupture of right shoulder, not specified as traumatic: Secondary | ICD-10-CM | POA: Diagnosis not present

## 2021-01-05 DIAGNOSIS — M75121 Complete rotator cuff tear or rupture of right shoulder, not specified as traumatic: Secondary | ICD-10-CM | POA: Diagnosis not present

## 2021-01-06 ENCOUNTER — Other Ambulatory Visit: Payer: Self-pay

## 2021-01-06 ENCOUNTER — Encounter: Payer: Self-pay | Admitting: Orthopedic Surgery

## 2021-01-06 ENCOUNTER — Ambulatory Visit (INDEPENDENT_AMBULATORY_CARE_PROVIDER_SITE_OTHER): Payer: Medicare Other | Admitting: Orthopedic Surgery

## 2021-01-06 DIAGNOSIS — M75121 Complete rotator cuff tear or rupture of right shoulder, not specified as traumatic: Secondary | ICD-10-CM
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
## 2021-01-07 DIAGNOSIS — M75121 Complete rotator cuff tear or rupture of right shoulder, not specified as traumatic: Secondary | ICD-10-CM | POA: Diagnosis not present

## 2021-01-12 DIAGNOSIS — M810 Age-related osteoporosis without current pathological fracture: Secondary | ICD-10-CM | POA: Diagnosis not present

## 2021-01-14 ENCOUNTER — Other Ambulatory Visit: Payer: Self-pay | Admitting: Surgical

## 2021-01-14 DIAGNOSIS — M75121 Complete rotator cuff tear or rupture of right shoulder, not specified as traumatic: Secondary | ICD-10-CM

## 2021-01-15 DIAGNOSIS — M75121 Complete rotator cuff tear or rupture of right shoulder, not specified as traumatic: Secondary | ICD-10-CM | POA: Diagnosis not present

## 2021-02-08 DIAGNOSIS — Z23 Encounter for immunization: Secondary | ICD-10-CM | POA: Diagnosis not present

## 2021-02-18 DIAGNOSIS — J302 Other seasonal allergic rhinitis: Secondary | ICD-10-CM | POA: Diagnosis not present

## 2021-02-18 DIAGNOSIS — M810 Age-related osteoporosis without current pathological fracture: Secondary | ICD-10-CM | POA: Diagnosis not present

## 2021-02-18 DIAGNOSIS — E785 Hyperlipidemia, unspecified: Secondary | ICD-10-CM | POA: Diagnosis not present

## 2021-02-18 DIAGNOSIS — Z23 Encounter for immunization: Secondary | ICD-10-CM | POA: Diagnosis not present

## 2021-02-18 DIAGNOSIS — Z131 Encounter for screening for diabetes mellitus: Secondary | ICD-10-CM | POA: Diagnosis not present

## 2021-02-18 DIAGNOSIS — E538 Deficiency of other specified B group vitamins: Secondary | ICD-10-CM | POA: Diagnosis not present

## 2021-02-18 DIAGNOSIS — K649 Unspecified hemorrhoids: Secondary | ICD-10-CM | POA: Diagnosis not present

## 2021-02-18 DIAGNOSIS — Z0000 Encounter for general adult medical examination without abnormal findings: Secondary | ICD-10-CM | POA: Diagnosis not present

## 2021-03-03 ENCOUNTER — Ambulatory Visit (INDEPENDENT_AMBULATORY_CARE_PROVIDER_SITE_OTHER): Payer: Medicare Other | Admitting: Orthopedic Surgery

## 2021-03-03 ENCOUNTER — Other Ambulatory Visit: Payer: Self-pay

## 2021-03-03 DIAGNOSIS — M75121 Complete rotator cuff tear or rupture of right shoulder, not specified as traumatic: Secondary | ICD-10-CM

## 2021-03-07 ENCOUNTER — Encounter: Payer: Self-pay | Admitting: Orthopedic Surgery
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
## 2021-03-23 DIAGNOSIS — H40013 Open angle with borderline findings, low risk, bilateral: Secondary | ICD-10-CM | POA: Diagnosis not present

## 2021-03-23 DIAGNOSIS — H524 Presbyopia: Secondary | ICD-10-CM | POA: Diagnosis not present

## 2021-04-11 DIAGNOSIS — Z20822 Contact with and (suspected) exposure to covid-19: Secondary | ICD-10-CM | POA: Diagnosis not present

## 2021-04-19 DIAGNOSIS — R928 Other abnormal and inconclusive findings on diagnostic imaging of breast: Secondary | ICD-10-CM | POA: Diagnosis not present

## 2021-04-19 DIAGNOSIS — N6311 Unspecified lump in the right breast, upper outer quadrant: Secondary | ICD-10-CM | POA: Diagnosis not present

## 2021-04-29 ENCOUNTER — Other Ambulatory Visit: Payer: Self-pay | Admitting: *Deleted

## 2021-04-29 DIAGNOSIS — Z87891 Personal history of nicotine dependence: Secondary | ICD-10-CM

## 2021-04-29 DIAGNOSIS — F17210 Nicotine dependence, cigarettes, uncomplicated: Secondary | ICD-10-CM

## 2021-05-19 ENCOUNTER — Ambulatory Visit
Admission: RE | Admit: 2021-05-19 | Discharge: 2021-05-19 | Disposition: A | Discharge status: Home / Self Care | Payer: Medicare Other | Source: Ambulatory Visit | Attending: Acute Care | Admitting: Acute Care

## 2021-05-19 DIAGNOSIS — Z87891 Personal history of nicotine dependence: Secondary | ICD-10-CM

## 2021-05-19 DIAGNOSIS — F17210 Nicotine dependence, cigarettes, uncomplicated: Secondary | ICD-10-CM | POA: Diagnosis not present

## 2021-05-20 ENCOUNTER — Telehealth: Payer: Self-pay | Admitting: Orthopedic Surgery

## 2021-05-20 DIAGNOSIS — M75121 Complete rotator cuff tear or rupture of right shoulder, not specified as traumatic: Secondary | ICD-10-CM

## 2021-05-20 MED ORDER — BACLOFEN 10 MG PO TABS: 10.0000 mg | ORAL_TABLET | Freq: Three times a day (TID) | ORAL | 0 refills | Status: DC

## 2021-05-20 NOTE — Telephone Encounter (Signed)
Pt needs a refill on Baclofen  ?

## 2021-05-20 NOTE — Addendum Note (Signed)
Addended by: Melton Alar on: 05/20/2021 05:32 PM ? ? Modules accepted: Orders ? ?

## 2021-05-21 ENCOUNTER — Other Ambulatory Visit: Payer: Self-pay

## 2021-05-21 DIAGNOSIS — F17210 Nicotine dependence, cigarettes, uncomplicated: Secondary | ICD-10-CM

## 2021-05-21 DIAGNOSIS — Z87891 Personal history of nicotine dependence: Secondary | ICD-10-CM

## 2021-05-23 DIAGNOSIS — J440 Chronic obstructive pulmonary disease with acute lower respiratory infection: Secondary | ICD-10-CM | POA: Diagnosis not present

## 2021-05-30 ENCOUNTER — Encounter (HOSPITAL_BASED_OUTPATIENT_CLINIC_OR_DEPARTMENT_OTHER): Payer: Self-pay

## 2021-05-30 ENCOUNTER — Emergency Department (HOSPITAL_BASED_OUTPATIENT_CLINIC_OR_DEPARTMENT_OTHER): Payer: Medicare Other

## 2021-05-30 ENCOUNTER — Other Ambulatory Visit: Payer: Self-pay

## 2021-05-30 ENCOUNTER — Observation Stay (HOSPITAL_BASED_OUTPATIENT_CLINIC_OR_DEPARTMENT_OTHER)
Admission: EM | Admit: 2021-05-30 | Discharge: 2021-05-31 | Disposition: A | Discharge status: Home / Self Care | Payer: Medicare Other | Attending: Internal Medicine | Admitting: Internal Medicine

## 2021-05-30 DIAGNOSIS — M75121 Complete rotator cuff tear or rupture of right shoulder, not specified as traumatic: Secondary | ICD-10-CM

## 2021-05-30 DIAGNOSIS — R0602 Shortness of breath: Secondary | ICD-10-CM | POA: Diagnosis not present

## 2021-05-30 DIAGNOSIS — R059 Cough, unspecified: Secondary | ICD-10-CM | POA: Diagnosis not present

## 2021-05-30 DIAGNOSIS — Z96641 Presence of right artificial hip joint: Secondary | ICD-10-CM | POA: Insufficient documentation

## 2021-05-30 DIAGNOSIS — J9601 Acute respiratory failure with hypoxia: Principal | ICD-10-CM | POA: Diagnosis present

## 2021-05-30 DIAGNOSIS — Z79899 Other long term (current) drug therapy: Secondary | ICD-10-CM | POA: Diagnosis not present

## 2021-05-30 DIAGNOSIS — F32A Depression, unspecified: Secondary | ICD-10-CM

## 2021-05-30 DIAGNOSIS — F17210 Nicotine dependence, cigarettes, uncomplicated: Secondary | ICD-10-CM | POA: Insufficient documentation

## 2021-05-30 DIAGNOSIS — J441 Chronic obstructive pulmonary disease with (acute) exacerbation: Secondary | ICD-10-CM | POA: Diagnosis not present

## 2021-05-30 DIAGNOSIS — J449 Chronic obstructive pulmonary disease, unspecified: Secondary | ICD-10-CM | POA: Diagnosis present

## 2021-05-30 DIAGNOSIS — Z20822 Contact with and (suspected) exposure to covid-19: Secondary | ICD-10-CM | POA: Diagnosis not present

## 2021-05-30 DIAGNOSIS — E785 Hyperlipidemia, unspecified: Secondary | ICD-10-CM

## 2021-05-30 LAB — RESP PANEL BY RT-PCR (FLU A&B, COVID) ARPGX2
Influenza A by PCR: NEGATIVE
Influenza B by PCR: NEGATIVE
SARS Coronavirus 2 by RT PCR: NEGATIVE

## 2021-05-30 LAB — CBC WITH DIFFERENTIAL/PLATELET
Abs Immature Granulocytes: 0.11 10*3/uL — ABNORMAL HIGH (ref 0.00–0.07)
Basophils Absolute: 0.1 10*3/uL (ref 0.0–0.1)
Basophils Relative: 1 %
Eosinophils Absolute: 0.1 10*3/uL (ref 0.0–0.5)
Eosinophils Relative: 1 %
HCT: 43 % (ref 36.0–46.0)
Hemoglobin: 14 g/dL (ref 12.0–15.0)
Immature Granulocytes: 1 %
Lymphocytes Relative: 49 %
Lymphs Abs: 5.2 10*3/uL — ABNORMAL HIGH (ref 0.7–4.0)
MCH: 32.3 pg (ref 26.0–34.0)
MCHC: 32.6 g/dL (ref 30.0–36.0)
MCV: 99.1 fL (ref 80.0–100.0)
Monocytes Absolute: 0.6 10*3/uL (ref 0.1–1.0)
Monocytes Relative: 6 %
Neutro Abs: 4.3 10*3/uL (ref 1.7–7.7)
Neutrophils Relative %: 42 %
Platelets: 276 10*3/uL (ref 150–400)
RBC: 4.34 MIL/uL (ref 3.87–5.11)
RDW: 13.2 % (ref 11.5–15.5)
WBC: 10.3 10*3/uL (ref 4.0–10.5)
nRBC: 0 % (ref 0.0–0.2)

## 2021-05-30 LAB — COMPREHENSIVE METABOLIC PANEL
ALT: 30 U/L (ref 0–44)
AST: 20 U/L (ref 15–41)
Albumin: 3.8 g/dL (ref 3.5–5.0)
Alkaline Phosphatase: 45 U/L (ref 38–126)
Anion gap: 9 (ref 5–15)
BUN: 16 mg/dL (ref 8–23)
CO2: 31 mmol/L (ref 22–32)
Calcium: 9.1 mg/dL (ref 8.9–10.3)
Chloride: 101 mmol/L (ref 98–111)
Creatinine, Ser: 0.55 mg/dL (ref 0.44–1.00)
GFR, Estimated: >60 mL/min (ref >60)
Glucose, Bld: 119 mg/dL — ABNORMAL HIGH (ref 70–99)
Potassium: 3.8 mmol/L (ref 3.5–5.1)
Sodium: 141 mmol/L (ref 135–145)
Total Bilirubin: 0.5 mg/dL (ref 0.3–1.2)
Total Protein: 7 g/dL (ref 6.5–8.1)

## 2021-05-30 MED ORDER — METHYLPREDNISOLONE SODIUM SUCC 125 MG IJ SOLR
60.0000 mg | INTRAMUSCULAR | Status: DC
  Administered 2021-05-30: 60 mg via INTRAVENOUS
  Filled 2021-05-30: qty 2

## 2021-05-30 MED ORDER — MAGNESIUM SULFATE 2 GM/50ML IV SOLN
2.0000 g | Freq: Once | INTRAVENOUS | Status: AC
  Administered 2021-05-30: 2 g via INTRAVENOUS
  Filled 2021-05-30: qty 50

## 2021-05-30 MED ORDER — ALBUTEROL SULFATE (2.5 MG/3ML) 0.083% IN NEBU: 2.5000 mg | INHALATION_SOLUTION | RESPIRATORY_TRACT | Status: DC | PRN

## 2021-05-30 MED ORDER — POTASSIUM CHLORIDE CRYS ER 20 MEQ PO TBCR
20.0000 meq | EXTENDED_RELEASE_TABLET | Freq: Once | ORAL | Status: AC
  Administered 2021-05-30: 20 meq via ORAL
  Filled 2021-05-30: qty 1

## 2021-05-30 MED ORDER — MOMETASONE FURO-FORMOTEROL FUM 200-5 MCG/ACT IN AERO
2.0000 | INHALATION_SPRAY | Freq: Two times a day (BID) | RESPIRATORY_TRACT | Status: DC
  Administered 2021-05-30 – 2021-05-31 (×2): 2 via RESPIRATORY_TRACT
  Filled 2021-05-30: qty 8.8

## 2021-05-30 MED ORDER — IPRATROPIUM-ALBUTEROL 0.5-2.5 (3) MG/3ML IN SOLN
3.0000 mL | Freq: Once | RESPIRATORY_TRACT | Status: AC
  Administered 2021-05-30: 3 mL via RESPIRATORY_TRACT
  Filled 2021-05-30: qty 3

## 2021-05-30 MED ORDER — OXYCODONE HCL 5 MG PO TABS
5.0000 mg | ORAL_TABLET | ORAL | Status: DC | PRN
  Administered 2021-05-31: 5 mg via ORAL
  Filled 2021-05-30: qty 1

## 2021-05-30 MED ORDER — ONDANSETRON HCL 4 MG PO TABS: 4.0000 mg | ORAL_TABLET | Freq: Four times a day (QID) | ORAL | Status: DC | PRN

## 2021-05-30 MED ORDER — MENTHOL 3 MG MT LOZG: 1.0000 | LOZENGE | OROMUCOSAL | Status: DC | PRN

## 2021-05-30 MED ORDER — ALBUTEROL SULFATE (2.5 MG/3ML) 0.083% IN NEBU
2.5000 mg | INHALATION_SOLUTION | Freq: Once | RESPIRATORY_TRACT | Status: AC
  Administered 2021-05-30: 2.5 mg via RESPIRATORY_TRACT
  Filled 2021-05-30: qty 3

## 2021-05-30 MED ORDER — GUAIFENESIN-DM 100-10 MG/5ML PO SYRP: 10.0000 mL | ORAL_SOLUTION | ORAL | Status: DC | PRN

## 2021-05-30 MED ORDER — ACETAMINOPHEN 325 MG PO TABS
650.0000 mg | ORAL_TABLET | Freq: Four times a day (QID) | ORAL | Status: DC | PRN
  Administered 2021-05-30 – 2021-05-31 (×2): 650 mg via ORAL
  Filled 2021-05-30 (×2): qty 2

## 2021-05-30 MED ORDER — ENOXAPARIN SODIUM 40 MG/0.4ML IJ SOSY
40.0000 mg | PREFILLED_SYRINGE | INTRAMUSCULAR | Status: DC
  Administered 2021-05-30: 40 mg via SUBCUTANEOUS
  Filled 2021-05-30: qty 0.4

## 2021-05-30 MED ORDER — SODIUM CHLORIDE 0.9 % IV SOLN: INTRAVENOUS | Status: DC | PRN

## 2021-05-30 MED ORDER — IPRATROPIUM-ALBUTEROL 0.5-2.5 (3) MG/3ML IN SOLN
3.0000 mL | Freq: Four times a day (QID) | RESPIRATORY_TRACT | Status: DC
  Administered 2021-05-30: 3 mL via RESPIRATORY_TRACT
  Filled 2021-05-30: qty 3

## 2021-05-30 MED ORDER — DIAZEPAM 5 MG PO TABS
2.5000 mg | ORAL_TABLET | Freq: Once | ORAL | Status: AC
  Administered 2021-05-30: 2.5 mg via ORAL
  Filled 2021-05-30: qty 1

## 2021-05-30 MED ORDER — ONDANSETRON HCL 4 MG/2ML IJ SOLN: 4.0000 mg | Freq: Four times a day (QID) | INTRAMUSCULAR | Status: DC | PRN

## 2021-05-30 MED ORDER — ACETAMINOPHEN 325 MG PO TABS
650.0000 mg | ORAL_TABLET | Freq: Once | ORAL | Status: AC
Start: 2021-05-30 — End: 2021-05-30
  Administered 2021-05-30: 650 mg via ORAL
  Filled 2021-05-30: qty 2

## 2021-05-30 MED ORDER — ACETAMINOPHEN 650 MG RE SUPP: 650.0000 mg | Freq: Four times a day (QID) | RECTAL | Status: DC | PRN

## 2021-05-30 MED ORDER — PREDNISONE 50 MG PO TABS
60.0000 mg | ORAL_TABLET | Freq: Once | ORAL | Status: AC
  Administered 2021-05-30: 60 mg via ORAL
  Filled 2021-05-30: qty 1

## 2021-05-30 MED ORDER — SENNOSIDES-DOCUSATE SODIUM 8.6-50 MG PO TABS: 1.0000 | ORAL_TABLET | Freq: Every evening | ORAL | Status: DC | PRN

## 2021-05-30 NOTE — ED Notes (Signed)
Called for report again, nurse still in room. States she will call when finished to get report ?

## 2021-05-30 NOTE — Plan of Care (Signed)
Patient rights and hand book provided. She has been oriented to the room. The pt verbalized understanding to call for assistance when needing to get out to of bed. Personal belongings and call button placed within reach.  ?
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
## 2021-05-30 NOTE — ED Notes (Signed)
Patient ambulated to room and SAT dropped to 85%. Placed on 2L initially and then weaned to 1L.  ?

## 2021-05-30 NOTE — ED Notes (Signed)
Attempted to call report to [facility] on 4E, nurse is busy, will attempt to call back in 10 mins ?

## 2021-05-30 NOTE — ED Provider Notes (Signed)
?[facility] EMERGENCY DEPARTMENT ?Provider Note ? ? ?CSN: [id number] ?Arrival date & time: 05/30/21  1043 ? ?  ? ?History ? ?Chief Complaint  ?Patient presents with  ? Shortness of Breath  ? ? ?Erica Richardson is a 71 y.o. female. ? ?HPI ?71 year old female with a history of COPD presents with dyspnea and cough.  Ongoing for about 1 week.  No fevers during this time.  Cough is mostly nonproductive but sometimes has some clear sputum.  Went to urgent care and was given amoxicillin, steroids (finished them this morning) and cough medicine.  The cough medicine ran out and because of her severe coughing spells, she was worried she would make it without more medicine so she came here.  No chest pain, fever. ? ?Home Medications ?Prior to Admission medications   ?Medication Sig Start Date End Date Taking? Authorizing Provider  ?ALBUTEROL IN Inhale into the lungs.    [provider]  ?atorvastatin (LIPITOR) 10 MG tablet Take 10 mg by mouth daily.    [provider]  ?baclofen (LIORESAL) 10 MG tablet Take 1 tablet (10 mg total) by mouth 3 (three) times daily. 05/20/21   Meredith Pel, MD  ?BIOTIN PO Take by mouth daily. 10, 000 mcg    [provider]  ?budesonide-formoterol (SYMBICORT) 160-4.5 MCG/ACT inhaler Inhale 2 puffs into the lungs 2 (two) times daily.    [provider]  ?citalopram (CELEXA) 40 MG tablet Take 40 mg by mouth daily.    [provider]  ?denosumab (PROLIA) 60 MG/ML SOSY injection Inject 60 mg into the skin every 6 (six) months.    [provider]  ?diazepam (VALIUM) 5 MG tablet Take 2.5 mg by mouth daily as needed for anxiety. 01/13/16   [provider]  ?diclofenac Sodium (VOLTAREN) 1 % GEL Apply 4 g topically 4 (four) times daily as needed. 01/20/20   Hilts, Legrand Como, MD  ?fluticasone (FLONASE) 50 MCG/ACT nasal spray Place 1 spray into both nostrils daily as needed for allergies or rhinitis.    [provider]   ?Ipratropium-Albuterol (COMBIVENT RESPIMAT) 20-100 MCG/ACT AERS respimat Inhale 1 puff into the lungs every 6 (six) hours as needed.    [provider]  ?loperamide (IMODIUM A-D) 2 MG tablet as needed.    [provider]  ?Nebulizer MISC     [provider]  ?pramipexole (MIRAPEX) 0.125 MG tablet Take 0.125 mg by mouth 3 (three) times daily.     [provider]  ?traMADol (ULTRAM) 50 MG tablet TAKE 1 TABLET BY MOUTH EVERY 12 HOURS AS NEEDED 01/01/21   Magnant, Gerrianne Scale, PA-C  ?   ? ?Allergies    ?Aleve [naproxen], Codeine, Ibuprofen, Meloxicam, Methocarbamol, Azithromycin, Erythromycin, and Prednisone   ? ?Review of Systems   ?Review of Systems  ?Constitutional:  Negative for fever.  ?Respiratory:  Positive for cough and shortness of breath.   ?Cardiovascular:  Negative for chest pain and leg swelling.  ? ?Physical Exam ?Updated Vital Signs ?BP (!) 138/52   Pulse 79   Temp 97.8 ?F (36.6 ?C) (Oral)   Resp (!) 21   Ht '5\' 4"'$  (1.626 m)   Wt 65.8 kg   SpO2 93%   BMI 24.89 kg/m?  ?Physical Exam ?Vitals and nursing note reviewed.  ?Constitutional:   ?   Appearance: She is well-developed.  ?HENT:  ?   Head: Normocephalic and atraumatic.  ?Cardiovascular:  ?   Rate and Rhythm: Normal rate and regular  rhythm.  ?   Heart sounds: Normal heart sounds.  ?Pulmonary:  ?   Effort: Pulmonary effort is normal. No accessory muscle usage or respiratory distress.  ?   Breath sounds: Wheezing (diffuse, expiratory) present.  ?Abdominal:  ?   Palpations: Abdomen is soft.  ?   Tenderness: There is no abdominal tenderness.  ?Musculoskeletal:  ?   Right lower leg: No edema.  ?   Left lower leg: No edema.  ?Skin: ?   General: Skin is warm and dry.  ?Neurological:  ?   Mental Status: She is alert.  ? ? ?ED Results / Procedures / Treatments   ?Labs ?(all labs ordered are listed, but only abnormal results are displayed) ?Labs Reviewed  ?COMPREHENSIVE METABOLIC PANEL - Abnormal; Notable for the following  components:  ?    Result Value  ? Glucose, Bld 119 (*)   ? All other components within normal limits  ?CBC WITH DIFFERENTIAL/PLATELET - Abnormal; Notable for the following components:  ? Lymphs Abs 5.2 (*)   ? Abs Immature Granulocytes 0.11 (*)   ? All other components within normal limits  ?RESP PANEL BY RT-PCR (FLU A&B, COVID) ARPGX2  ? ? ?EKG ?EKG Interpretation ? ?Date/Time:  Sunday May 30 2021 10:55:21 EDT ?Ventricular Rate:  54 ?PR Interval:  188 ?QRS Duration: 101 ?QT Interval:  458 ?QTC Calculation: 434 ?R Axis:   84 ?Text Interpretation: Sinus rhythm Borderline right axis deviation RSR' in V1 or V2, probably normal variant  poor? baseline? limits interpretation Confirmed by Sherwood Gambler [phone]) on 05/30/2021 12:30:56 PM ? ?Radiology ?DG Chest Portable 1 View ? ?Result Date: 05/30/2021 ?CLINICAL DATA:  Shortness of breath, cough EXAM: PORTABLE CHEST 1 VIEW COMPARISON:  04/27/2018 FINDINGS: The heart size and mediastinal contours are within normal limits. Both lungs are clear. The visualized skeletal structures are unremarkable. IMPRESSION: No acute abnormality of the lungs in AP portable projection. Electronically Signed   By: Delanna Ahmadi M.D.   On: 05/30/2021 11:43   ? ?Procedures ?Procedures  ? ? ?Medications Ordered in ED ?Medications  ?magnesium sulfate IVPB 2 g 50 mL (2 g Intravenous New Bag/Given 05/30/21 1401)  ?0.9 %  sodium chloride infusion ( Intravenous New Bag/Given 05/30/21 1400)  ?albuterol (PROVENTIL) (2.5 MG/3ML) 0.083% nebulizer solution 2.5 mg (2.5 mg Nebulization Given 05/30/21 1117)  ?ipratropium-albuterol (DUONEB) 0.5-2.5 (3) MG/3ML nebulizer solution 3 mL (3 mLs Nebulization Given 05/30/21 1117)  ?ipratropium-albuterol (DUONEB) 0.5-2.5 (3) MG/3ML nebulizer solution 3 mL (3 mLs Nebulization Given by Other 05/30/21 1207)  ?albuterol (PROVENTIL) (2.5 MG/3ML) 0.083% nebulizer solution 2.5 mg (2.5 mg Nebulization Given by Other 05/30/21 1208)  ?predniSONE (DELTASONE) tablet 60 mg (60 mg Oral Given  05/30/21 1207)  ?ipratropium-albuterol (DUONEB) 0.5-2.5 (3) MG/3ML nebulizer solution 3 mL (3 mLs Nebulization Given 05/30/21 1341)  ?albuterol (PROVENTIL) (2.5 MG/3ML) 0.083% nebulizer solution 2.5 mg (2.5 mg Nebulization Given 05/30/21 1341)  ?acetaminophen (TYLENOL) tablet 650 mg (650 mg Oral Given 05/30/21 1346)  ?potassium chloride SA (KLOR-CON M) CR tablet 20 mEq (20 mEq Oral Given 05/30/21 1357)  ? ? ?ED Course/ Medical Decision Making/ A&P ?  ?                        ?Medical Decision Making ?Amount and/or Complexity of Data Reviewed ?External Data Reviewed: notes. ?Labs: ordered. ?Radiology: ordered and independent interpretation performed. ?ECG/medicine tests: ordered and independent interpretation performed. ? ?Risk ?OTC drugs. ?Prescription drug management. ?Decision regarding hospitalization. ? ? ?Patient  presents with mild hypoxia in association with COPD exacerbation.  Chart has been reviewed and she does currently follow with pulmonology with chronic COPD but is not on home oxygen.  She was given multiple albuterol treatments here with Atrovent and is feeling a little better but still feels short of breath and still requiring oxygen.  She is in no distress.  Chest x-ray image reviewed by myself and there is no pneumonia.  Labs obtained and are unremarkable including normal WBC.  ECG is nonspecific but she has no chest pain and my suspicion of ACS is fairly low.  Doubt PE. ? ?When further talking to patient, she endorses that she went from 60 mg down to 10 mg and 6 days and so I do not think she got an adequate burst of steroids that she would typically get for COPD exacerbation and so I have given her 60 mg here today.  I discussed with Dr. Olevia Bowens for admission. ? ? ? ? ? ? ? ?Final Clinical Impression(s) / ED Diagnoses ?Final diagnoses:  ?Acute respiratory failure with hypoxia ([place])  ?COPD exacerbation ([place])  ? ? ?Rx / DC Orders ?ED Discharge Orders   ? ? None  ? ?  ? ? ?  ?Sherwood Gambler, MD ?05/30/21  1421 ? ?

## 2021-05-30 NOTE — Progress Notes (Signed)
The patient arrived to the unit. She is alert, oriented x4 & ambulatory. Oxygen saturation is 92 % on 2 lt post activity. Will page to alert provider service of arrival.  ?

## 2021-05-30 NOTE — ED Notes (Signed)
[facility] at bedside for transport. 

## 2021-05-30 NOTE — Progress Notes (Signed)
Plan of Care Note for accepted transfer ? ? ?Patient: Erica Richardson MRN: [id number]   DOA: 05/30/2021 ? ?Facility requesting transfer: [facility].  ?Requesting Provider: Sherwood Gambler, MD ?Reason for transfer: Hypoxia in the setting of COPD exacerbation. ?Facility course:  ?71 year old female with a past medical history of COPD, hyperlipidemia, depression, anxiety, osteoarthritis who is coming to the emergency department complaints of progressively worse dyspnea associated with wheezing and has not responded to outpatient treatment with home nebs, Augmentin and prednisone.  She received 60 mg of prednisone p.o. a DuoNeb and a 2.5 mg albuterol neb.  I added magnesium sulfate 2 g IVPB and K-Lor 20 mEq p.o. x1.  Accepted to PCU for acute respiratory failure with hypoxia in the 80s now requiring 2 LPM of oxygen via . ? ?Plan of care: ?The patient is accepted for admission to Progressive unit, at [facility]. ? ?Author: ?Reubin Milan, MD ?05/30/2021 ? ?Check [email or web address] for on-call coverage. ? ?Nursing staff, Please call [place] number on Amion as soon as patient's arrival, so appropriate admitting provider can evaluate the pt. ?

## 2021-05-30 NOTE — ED Triage Notes (Addendum)
C/o SOB x 1 week. Hx of COPD. SpO2 84% on RA upon ambulation to room, placed on 1L . Went to UC a week ago with body aches, cough & SOB, was started on amoxicillin & prednisone. Took nebulizer at 0900 with no improvement ?

## 2021-05-31 DIAGNOSIS — J441 Chronic obstructive pulmonary disease with (acute) exacerbation: Secondary | ICD-10-CM | POA: Diagnosis not present

## 2021-05-31 DIAGNOSIS — E785 Hyperlipidemia, unspecified: Secondary | ICD-10-CM | POA: Diagnosis not present

## 2021-05-31 DIAGNOSIS — F32A Depression, unspecified: Secondary | ICD-10-CM | POA: Diagnosis not present

## 2021-05-31 DIAGNOSIS — J9601 Acute respiratory failure with hypoxia: Secondary | ICD-10-CM | POA: Diagnosis not present

## 2021-05-31 LAB — HIV ANTIBODY (ROUTINE TESTING W REFLEX): HIV Screen 4th Generation wRfx: NONREACTIVE

## 2021-05-31 MED ORDER — GUAIFENESIN-DM 100-10 MG/5ML PO SYRP: 10.0000 mL | ORAL_SOLUTION | ORAL | 1 refills | Status: AC | PRN

## 2021-05-31 MED ORDER — ATORVASTATIN CALCIUM 10 MG PO TABS: 10.0000 mg | ORAL_TABLET | Freq: Every evening | ORAL | Status: DC

## 2021-05-31 MED ORDER — CITALOPRAM HYDROBROMIDE 20 MG PO TABS: 40.0000 mg | ORAL_TABLET | Freq: Every evening | ORAL | Status: DC

## 2021-05-31 MED ORDER — IPRATROPIUM-ALBUTEROL 0.5-2.5 (3) MG/3ML IN SOLN
3.0000 mL | Freq: Three times a day (TID) | RESPIRATORY_TRACT | Status: DC
Start: 2021-05-31 — End: 2021-05-31
  Administered 2021-05-31: 3 mL via RESPIRATORY_TRACT
  Filled 2021-05-31: qty 3

## 2021-05-31 MED ORDER — BACLOFEN 10 MG PO TABS: 10.0000 mg | ORAL_TABLET | Freq: Three times a day (TID) | ORAL | Status: DC | PRN

## 2021-05-31 MED ORDER — PREDNISONE 20 MG PO TABS
40.0000 mg | ORAL_TABLET | Freq: Every day | ORAL | 0 refills | Status: AC
Start: 2021-05-31 — End: 2021-06-07

## 2021-05-31 MED ORDER — METHYLPREDNISOLONE SODIUM SUCC 125 MG IJ SOLR
60.0000 mg | Freq: Once | INTRAMUSCULAR | Status: AC
  Administered 2021-05-31: 60 mg via INTRAVENOUS
  Filled 2021-05-31: qty 2

## 2021-05-31 MED ORDER — DIAZEPAM 5 MG PO TABS
2.5000 mg | ORAL_TABLET | Freq: Every day | ORAL | Status: DC | PRN
  Administered 2021-05-31: 2.5 mg via ORAL
  Filled 2021-05-31: qty 1

## 2021-05-31 NOTE — Discharge Summary (Signed)
Physician Discharge Summary  ?Erica Richardson UQJ:[id number] DOB: 05-Nov-1950 DOA: 05/30/2021 ? ?PCP: Stamey, Girtha Rm, FNP ? ?Admit date: 05/30/2021 ?Discharge date: 05/31/2021 ? ?Admitted From: Home ?Disposition: Home ? ?Recommendations for Outpatient Follow-up:  ?Follow up with PCP in 1 week ?Outpatient follow-up with pulmonary ?Follow up in ED if symptoms worsen or new appear ? ? ?Home Health: No ?Equipment/Devices: None ? ?Discharge Condition: Stable ?CODE STATUS: Full ?Diet recommendation: Heart healthy ? ?Brief/Interim Summary: ? 71 y.o. female with medical history significant of COPD not on home oxygen, chronic tobacco use, hyperlipidemia, depression, anxiety, osteoarthritis presented with worsening shortness of breath.  On presentation, she was diffusely wheezing.  Chest x-ray was negative for infiltrates.  COVID-19/influenza test was negative.   Her saturations were in the 80s requiring 2 L oxygen supplementation.  She was started on IV Solu-Medrol and breathing treatments.  During the hospitalization, her condition has improved.  She feels much better and wants to go home today.  She will be discharged home on oral prednisone and inhaled treatments.  Outpatient follow-up with PCP.  She needs to stop smoking. ? ?Discharge Diagnoses:  ? ?COPD exacerbation ?Acute respiratory failure with hypoxia ?Ongoing tobacco use ?-Presented with ongoing worsening cough and shortness of breath despite being on oral steroids, antibiotics and cough medication.  Chest x-ray negative for infiltrates.  Influenza and COVID testing negative. ?-Treated with oral prednisone in the ED. subsequently started on IV Solu-Medrol along with inhaled steroids and nebs. ?-Counseled regarding tobacco cessation ?-Respiratory status has improved.  Currently on room air. ?-Patient was to go home today.  She will be discharged home today on prednisone 40 mg daily for 7 days.  Continue home inhaled regimen.  Outpatient follow-up with PCP and  pulmonary. ? ?Hyperlipidemia ?-Continue statin ? ?Anxiety/depression ?-Continue home regimen ? ?  ? ?Discharge Instructions ? ?Discharge Instructions   ? ? Diet - low sodium heart healthy   Complete by: As directed ?  ? Increase activity slowly   Complete by: As directed ?  ? ?  ? ?Allergies as of 05/31/2021   ? ?   Reactions  ? Aleve [naproxen] Other (See Comments)  ? Codeine Other (See Comments)  ? Upset stomach  ? Ibuprofen Other (See Comments)  ? Upset stomach  ? Meloxicam Other (See Comments)  ? Methocarbamol Diarrhea  ? Azithromycin Rash  ? Erythromycin Rash  ? Prednisone Anxiety  ? Pt states she had surgery and 1 week post op was given "large doses" of IV steroids and she "was thrashing around, could not sit still, etc"  ? ?  ? ?  ?Medication List  ?  ? ?STOP taking these medications   ? ?diclofenac Sodium 1 % Gel ?Commonly known as: Voltaren ?  ?traMADol 50 MG tablet ?Commonly known as: ULTRAM ?  ? ?  ? ?TAKE these medications   ? ?albuterol 108 (90 Base) MCG/ACT inhaler ?Commonly known as: VENTOLIN HFA ?Inhale 2 puffs into the lungs every 4 (four) hours as needed for wheezing or shortness of breath. ?  ?albuterol (2.5 MG/3ML) 0.083% nebulizer solution ?Commonly known as: PROVENTIL ?Take 2.5 mg by nebulization every 4 (four) hours as needed for wheezing or shortness of breath. ?  ?atorvastatin 10 MG tablet ?Commonly known as: LIPITOR ?Take 10 mg by mouth every evening. ?  ?baclofen 10 MG tablet ?Commonly known as: LIORESAL ?Take 1 tablet (10 mg total) by mouth 3 (three) times daily as needed for muscle spasms. ?  ?BIOTIN PO ?Take 1  tablet by mouth daily. ?  ?citalopram 40 MG tablet ?Commonly known as: CELEXA ?Take 40 mg by mouth every evening. ?  ?Combivent Respimat 20-100 MCG/ACT Aers respimat ?Generic drug: Ipratropium-Albuterol ?Inhale 1 puff into the lungs every 6 (six) hours as needed for wheezing or shortness of breath. ?  ?denosumab 60 MG/ML Sosy injection ?Commonly known as: PROLIA ?Inject 60 mg into  the skin every 6 (six) months. ?  ?diazepam 5 MG tablet ?Commonly known as: VALIUM ?Take 2.5-5 mg by mouth daily as needed for anxiety. ?  ?fluticasone 50 MCG/ACT nasal spray ?Commonly known as: FLONASE ?Place 1 spray into both nostrils daily as needed for allergies or rhinitis. ?  ?guaiFENesin-dextromethorphan 100-10 MG/5ML syrup ?Commonly known as: ROBITUSSIN DM ?Take 10 mLs by mouth every 4 (four) hours as needed for cough. ?  ?loperamide 2 MG tablet ?Commonly known as: IMODIUM A-D ?Take 2 mg by mouth 3 (three) times daily as needed for diarrhea or loose stools. ?  ?Nebulizer Misc ?  ?predniSONE 20 MG tablet ?Commonly known as: DELTASONE ?Take 2 tablets (40 mg total) by mouth daily with breakfast for 7 days. ?  ?Symbicort 80-4.5 MCG/ACT inhaler ?Generic drug: budesonide-formoterol ?Inhale 2 puffs into the lungs 2 (two) times daily. ?  ? ?  ? ? Follow-up Information   ? ? Stamey, Girtha Rm, FNP. Schedule an appointment as soon as possible for a visit in 1 week(s).   ?Specialty: Family Medicine ?Contact information: ?[address] ?[place] [address] ?[phone] ? ? ?  ?  ? ?  ?  ? ?  ? ?Allergies  ?Allergen Reactions  ? Aleve [Naproxen] Other (See Comments)  ? Codeine Other (See Comments)  ?  Upset stomach  ? Ibuprofen Other (See Comments)  ?  Upset stomach  ? Meloxicam Other (See Comments)  ? Methocarbamol Diarrhea  ? Azithromycin Rash  ? Erythromycin Rash  ? Prednisone Anxiety  ?  Pt states she had surgery and 1 week post op was given "large doses" of IV steroids and she "was thrashing around, could not sit still, etc"  ? ? ?Consultations: ?None ? ? ?Procedures/Studies: ?DG Chest Portable 1 View ? ?Result Date: 05/30/2021 ?CLINICAL DATA:  Shortness of breath, cough EXAM: PORTABLE CHEST 1 VIEW COMPARISON:  04/27/2018 FINDINGS: The heart size and mediastinal contours are within normal limits. Both lungs are clear. The visualized skeletal structures are unremarkable. IMPRESSION: No acute abnormality of the lungs  in AP portable projection. Electronically Signed   By: Delanna Ahmadi M.D.   On: 05/30/2021 11:43  ? ?CT CHEST LUNG CA SCREEN LOW DOSE W/O CM ? ?Result Date: 05/20/2021 ?CLINICAL DATA:  Current smoker, 40 pack-year history. EXAM: CT CHEST WITHOUT CONTRAST LOW-DOSE FOR LUNG CANCER SCREENING TECHNIQUE: Multidetector CT imaging of the chest was performed following the standard protocol without IV contrast. RADIATION DOSE REDUCTION: This exam was performed according to the departmental dose-optimization program which includes automated exposure control, adjustment of the mA and/or kV according to patient size and/or use of iterative reconstruction technique. COMPARISON:  03/18/2020. FINDINGS: Cardiovascular: Heart size normal.  No pericardial effusion. Mediastinum/Nodes: No pathologically enlarged mediastinal or axillary lymph nodes. Hilar regions are difficult to definitively evaluate without IV contrast but appear grossly unremarkable. Esophagus is grossly unremarkable. Lungs/Pleura: Centrilobular and paraseptal emphysema. Mild smoking related respiratory bronchiolitis. Pulmonary nodules measure 4.0 mm or less in size, as before. No suspicious pulmonary nodules. No pleural fluid. Minimal debris in the airway. Upper Abdomen: Visualized portions of  the liver, gallbladder, adrenal glands and right kidney are unremarkable. 1.4 cm low-density lesion in the left kidney is likely a cyst. No follow-up necessary. Visualized portions of the spleen, pancreas, stomach and bowel are grossly unremarkable. Musculoskeletal: Mild degenerative changes in the spine. No worrisome lytic or sclerotic lesions. IMPRESSION: 1. Lung-RADS 2, benign appearance or behavior. Continue annual screening with low-dose chest CT without contrast in 12 months. 2.  Emphysema (ICD10-J43.9). Electronically Signed   By: Lorin Picket M.D.   On: 05/20/2021 14:49  ? ? ? ?Subjective: ?Patient seen and examined at bedside.  Feels much better and wants to go home  today.  No overnight fever, nausea, vomiting reported.  Still has intermittent cough. ? ?Discharge Exam: ?Vitals:  ? 05/31/21 0851 05/31/21 0906  ?BP:    ?Pulse:    ?Resp:    ?Temp:    ?SpO2: 97% 97%  ? ? ?Gene

## 2021-06-01 DIAGNOSIS — J9600 Acute respiratory failure, unspecified whether with hypoxia or hypercapnia: Secondary | ICD-10-CM | POA: Diagnosis not present

## 2021-06-01 DIAGNOSIS — Z09 Encounter for follow-up examination after completed treatment for conditions other than malignant neoplasm: Secondary | ICD-10-CM | POA: Diagnosis not present

## 2021-06-01 DIAGNOSIS — J441 Chronic obstructive pulmonary disease with (acute) exacerbation: Secondary | ICD-10-CM | POA: Diagnosis not present

## 2021-06-07 ENCOUNTER — Ambulatory Visit: Payer: Medicare Other | Admitting: Orthopedic Surgery

## 2021-06-14 DIAGNOSIS — Z20822 Contact with and (suspected) exposure to covid-19: Secondary | ICD-10-CM | POA: Diagnosis not present

## 2021-06-22 ENCOUNTER — Ambulatory Visit (INDEPENDENT_AMBULATORY_CARE_PROVIDER_SITE_OTHER): Payer: Medicare Other | Admitting: Internal Medicine

## 2021-06-22 ENCOUNTER — Encounter: Payer: Self-pay | Admitting: Internal Medicine

## 2021-06-22 VITALS — BP 116/64 | HR 66 | Temp 98.0°F | Ht 63.0 in | Wt 145.0 lb

## 2021-06-22 DIAGNOSIS — M8589 Other specified disorders of bone density and structure, multiple sites: Secondary | ICD-10-CM | POA: Diagnosis not present

## 2021-06-22 DIAGNOSIS — F17210 Nicotine dependence, cigarettes, uncomplicated: Secondary | ICD-10-CM

## 2021-06-22 DIAGNOSIS — J449 Chronic obstructive pulmonary disease, unspecified: Secondary | ICD-10-CM | POA: Diagnosis not present

## 2021-06-22 DIAGNOSIS — Z780 Asymptomatic menopausal state: Secondary | ICD-10-CM | POA: Diagnosis not present

## 2021-06-22 DIAGNOSIS — F17200 Nicotine dependence, unspecified, uncomplicated: Secondary | ICD-10-CM

## 2021-06-22 DIAGNOSIS — M810 Age-related osteoporosis without current pathological fracture: Secondary | ICD-10-CM | POA: Diagnosis not present
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
## 2021-06-22 NOTE — Patient Instructions (Addendum)
Please schedule follow up scheduled with myself in 3 months.  If my schedule is not open yet, we will contact you with a reminder closer to that time. Please call [phone] if you haven't heard from [name] a month before.  ? ?Before your next visit I would like you to have: ? ?Full set of PFTs- 40 minutes ? ?Continue symbicort ?Continue albuterol as needed up to 4 times a day for shortness of breath ? ?Can try claritin once a day for nasal drainage.  ?Continue flonase.  ? ?Flonase - 1 spray on each side of your nose twice a day for first week, then 1 spray on each side.  ? Instructions for use: ?If you also use a saline nasal spray or rinse, use that first. ?Position the head with the chin slightly tucked. Use the right hand to spray into the left nostril and the right hand to spray into the left nostril.   Point the bottle away from the septum of your nose (cartilage that divides the two sides of your nose).  ?Hold the nostril closed on the opposite side from where you will spray ?Spray once and gently sniff to pull the medicine into the higher parts of your nose.  Don't sniff too hard as the medicine will drain down the back of your throat instead. ?Repeat with a second spray on the same side if prescribed. ?Repeat on the other side of your nose. ? ? ?Understanding COPD  ? ?What is COPD? ?COPD stands for chronic obstructive pulmonary (lung) disease. COPD is a general term used for several lung diseases.  COPD is an umbrella term and encompasses other  common diseases in this group like chronic bronchitis and emphysema. Chronic asthma may also be included in this group. While some patients with COPD have only chronic bronchitis or emphysema, most patients have a combination of both.  You might hear these terms used in exchange for one another.  ? ?COPD adds to the work of the heart. Diseased lungs may reduce the amount of oxygen that goes to the blood. High blood pressure in blood vessels from the heart to the  lungs makes it difficult for the heart to pump. Lung disease can also cause the body to produce too many red blood cells which may make the blood thicker and harder to pump.  ? ?Patients who have COPD with low oxygen levels may develop an enlarged heart (cor pulmonale). This condition weakens the heart and causes increased shortness of breath and swelling in the legs and feet.  ? ?Chronic bronchitis ?Chronic bronchitis is irritation and inflammation (swelling) of the lining in the bronchial tubes (air passages). The irritation causes coughing and an excess amount of mucus in the airways. The swelling makes it difficult to get air in and out of the lungs. The small, hair-like structures on the inside of the airways (called cilia) may be damaged by the irritation. The cilia are then unable to help clean mucus from the airways.  ?Bronchitis is generally considered to be chronic when you have: a productive cough (cough up mucus) and shortness of breath that lasts about 3 months or more each year for 2 or more years in a row. Your doctor may define chronic bronchitis differently.  ? ?Emphysema ?Emphysema is the destruction, or breakdown, of the walls of the alveoli (air sacs) located at the end of the bronchial tubes. The damaged alveoli are not able to exchange oxygen and carbon dioxide between the lungs and  the blood. The bronchioles lose their elasticity and collapse when you exhale, trapping air in the lungs. The trapped air keeps fresh air and oxygen from entering the lungs.  ? ?Who is affected by COPD? ?Emphysema and chronic bronchitis affect approximately 16 million people in the [place], or close to 11 percent of the population.  ? ?Symptoms of COPD  ?Shortness of breath  ?Shortness of breath with mild exercise (walking, using the stairs, etc.)  ?Chronic, productive cough (with mucus)  ?A feeling of "tightness" in the chest  ?Wheezing  ? ?What causes COPD? ?The two primary causes of COPD are cigarette  smoking and alpha1-antitrypsin (AAT) deficiency. Air pollution and occupational dusts may also contribute to COPD, especially when the person exposed to these substances is a cigarette smoker.  ?Cigarette smoke causes COPD by irritating the airways and creating inflammation that narrows the airways, making it more difficult to breathe. Cigarette smoke also causes the cilia to stop working properly so mucus and trapped particles are not cleaned from the airways. As a result, chronic cough and excess mucus production develop, leading to chronic bronchitis.  ?In some people, chronic bronchitis and infections can lead to destruction of the small airways, or emphysema.  ?AAT deficiency, an inherited disorder, can also lead to emphysema. Alpha antitrypsin (AAT) is a protective material produced in the liver and transported to the lungs to help combat inflammation. When there is not enough of the chemical AAT, the body is no longer protected from an enzyme in the white blood cells.  ? ?How is COPD diagnosed?  ?To diagnose COPD, the physician needs to know: ?Do you smoke?  ?Have you had chronic exposure to dust or air pollutants?  ?Do other members of your family have lung disease?  ?Are you short of breath?  ?Do you get short of breath with exercise?  ?Do you have chronic cough and/or wheezing?  ?Do you cough up excess mucus?  ?To help with the diagnosis, the physician will conduct a thorough physical exam which includes:  ?Listening to your lungs and heart  ?Checking your blood pressure and pulse  ?Examining your nose and throat  ?Checking your feet and ankles for swelling  ? ?Laboratory and other tests ?Several laboratory and other tests are needed to confirm a diagnosis of COPD. These tests may include:  ?Chest X-ray to look for lung changes that could be caused by COPD  ? Spirometry and pulmonary function tests (PFTs) to determine lung volume and air flow  ?Pulse oximetry to measure the saturation of oxygen in the  blood  ?Arterial blood gases (ABGs) to determine the amount of oxygen and carbon dioxide in the blood  ?Exercise testing to determine if the oxygen level in the blood drops during exercise  ? ?Treatment ?In the beginning stages of COPD, there is minimal shortness of breath that may be noticed only during exercise. As the disease progresses, shortness of breath may worsen and you may need to wear an oxygen device.  ? ?To help control other symptoms of COPD, the following treatments and lifestyle changes may be prescribed.  ?Quitting smoking  ?Avoiding cigarette smoke and other irritants  ?Taking medications including: ?a. bronchodilators ?b. anti-inflammatory agents ?c. oxygen ?d. antibiotics  ?Maintaining a healthy diet  ?Following a structured exercise program such as pulmonary rehabilitation ?Preventing respiratory infections  ?Controlling stress  ? ?If your COPD progresses, you may be eligible to be evaluated for lung volume reduction surgery or lung transplantation. You may also  be eligible to participate in certain clinical trials (research studies). Ask your health care providers about studies being conducted in your hospital.  ? ?What is the outlook? ?Although COPD can not be cured, its symptoms can be treated and your quality of life can be improved. Your prognosis or outlook for the future will depend on how well your lungs are functioning, your symptoms, and how well you respond to and follow your treatment plan.  ? ?What are the benefits of quitting smoking? ?Quitting smoking can lower your chances of getting or dying from heart disease, lung disease, kidney failure, infection, or cancer. It can also lower your chances of getting osteoporosis, a condition that makes your bones weak. Plus, quitting smoking can help your skin look younger and reduce the chances that you will have problems with sex. ? ?Quitting smoking will improve your health no matter how old you are, and no matter how long or how much you  have smoked. ? ?What should I do if I want to quit smoking? ?The letters in the word "START" can help you remember the steps to take: ?S = Set a quit date. ?T = Tell family, friends, and the people around you th

## 2021-06-28 ENCOUNTER — Ambulatory Visit: Payer: Medicare Other | Admitting: Orthopedic Surgery

## 2021-06-28 ENCOUNTER — Telehealth: Payer: Self-pay | Admitting: Orthopedic Surgery

## 2021-06-28 NOTE — Telephone Encounter (Signed)
Patient is requesting to see Dr. Ernestina Patches again to go over MRI results on her back and give injections she no longer wants use Dr. Marlou Sa for pain management. ?

## 2021-06-29 DIAGNOSIS — Z20822 Contact with and (suspected) exposure to covid-19: Secondary | ICD-10-CM | POA: Diagnosis not present

## 2021-07-01 ENCOUNTER — Encounter: Payer: Self-pay | Admitting: Physical Medicine and Rehabilitation

## 2021-07-01 ENCOUNTER — Ambulatory Visit (INDEPENDENT_AMBULATORY_CARE_PROVIDER_SITE_OTHER): Payer: Medicare Other | Admitting: Physical Medicine and Rehabilitation

## 2021-07-01 VITALS — BP 102/57 | HR 62

## 2021-07-01 DIAGNOSIS — M7918 Myalgia, other site: Secondary | ICD-10-CM | POA: Diagnosis not present

## 2021-07-01 DIAGNOSIS — M47816 Spondylosis without myelopathy or radiculopathy, lumbar region: Secondary | ICD-10-CM | POA: Diagnosis not present

## 2021-07-01 MED ORDER — BACLOFEN 10 MG PO TABS: 10.0000 mg | ORAL_TABLET | Freq: Three times a day (TID) | ORAL | 0 refills | Status: DC

## 2021-07-01 NOTE — Progress Notes (Signed)
Numeric Pain Rating Scale and Functional Assessment ?Average Pain 5 ? ? ?In the last MONTH (on 0-10 scale) has pain interfered with the following? ? ?1. General activity like being  able to carry out your everyday physical activities such as walking, climbing stairs, carrying groceries, or moving a chair?  ?Rating(5) ? ?Patient is here for LBP. Pain for several months. Previous xrays and MRI. Takes Tylenol for pain. Rubs +Aspercreme. Difficult sleeping. ? ? ? ? ? ? ?
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
## 2021-07-06 ENCOUNTER — Ambulatory Visit
Admission: RE | Admit: 2021-07-06 | Discharge: 2021-07-06 | Disposition: A | Discharge status: Home / Self Care | Payer: Medicare Other | Source: Ambulatory Visit | Attending: Physical Medicine and Rehabilitation | Admitting: Physical Medicine and Rehabilitation

## 2021-07-06 DIAGNOSIS — D259 Leiomyoma of uterus, unspecified: Secondary | ICD-10-CM | POA: Diagnosis not present

## 2021-07-06 DIAGNOSIS — M545 Low back pain, unspecified: Secondary | ICD-10-CM | POA: Diagnosis not present

## 2021-07-06 DIAGNOSIS — M1612 Unilateral primary osteoarthritis, left hip: Secondary | ICD-10-CM | POA: Diagnosis not present

## 2021-07-06 DIAGNOSIS — M25452 Effusion, left hip: Secondary | ICD-10-CM | POA: Diagnosis not present

## 2021-07-08 ENCOUNTER — Other Ambulatory Visit: Payer: Self-pay | Admitting: Physical Medicine and Rehabilitation

## 2021-07-08 DIAGNOSIS — M7918 Myalgia, other site: Secondary | ICD-10-CM

## 2021-07-08 NOTE — Progress Notes (Signed)
New pelvic MRI imaging exhibits possible disc herniation at the level of L3-L4 that could be a cause of her pain. We feel the next step is to place order for right L4-L5 interlaminar epidural steroid injection under fluoroscopic guidance. I did call patient to discuss pelvic MRI findings with her, she has no questions. We will proceed with injection.

## 2021-07-13 ENCOUNTER — Other Ambulatory Visit: Payer: Self-pay | Admitting: Physical Medicine and Rehabilitation

## 2021-07-26 ENCOUNTER — Other Ambulatory Visit: Payer: Self-pay | Admitting: Physical Medicine and Rehabilitation

## 2021-07-26 MED ORDER — BACLOFEN 10 MG PO TABS: 10.0000 mg | ORAL_TABLET | Freq: Three times a day (TID) | ORAL | 0 refills | Status: DC

## 2021-07-26 NOTE — Telephone Encounter (Signed)
Patient would like a refill of Baclofen

## 2021-08-02 DIAGNOSIS — R110 Nausea: Secondary | ICD-10-CM | POA: Diagnosis not present

## 2021-08-02 DIAGNOSIS — F17210 Nicotine dependence, cigarettes, uncomplicated: Secondary | ICD-10-CM | POA: Diagnosis not present

## 2021-08-02 DIAGNOSIS — R52 Pain, unspecified: Secondary | ICD-10-CM | POA: Diagnosis not present

## 2021-08-12 ENCOUNTER — Ambulatory Visit: Payer: Self-pay

## 2021-08-12 ENCOUNTER — Ambulatory Visit (INDEPENDENT_AMBULATORY_CARE_PROVIDER_SITE_OTHER): Payer: Medicare Other | Admitting: Physical Medicine and Rehabilitation

## 2021-08-12 ENCOUNTER — Encounter: Payer: Self-pay | Admitting: Physical Medicine and Rehabilitation

## 2021-08-12 DIAGNOSIS — M5416 Radiculopathy, lumbar region: Secondary | ICD-10-CM

## 2021-08-12 MED ORDER — METHYLPREDNISOLONE ACETATE 80 MG/ML IJ SUSP
80.0000 mg | Freq: Once | INTRAMUSCULAR | Status: AC
  Administered 2021-08-12: 80 mg

## 2021-08-12 NOTE — Patient Instructions (Signed)

## 2021-08-12 NOTE — Progress Notes (Signed)
Pt state lower back pain. Pt state laying down and sitting makes the pain worse. Pt state over the counter pain meds and pain patches to help ease her pain.  Numeric Pain Rating Scale and Functional Assessment Average Pain 4   In the last MONTH (on 0-10 scale) has pain interfered with the following?  1. General activity like being  able to carry out your everyday physical activities such as walking, climbing stairs, carrying groceries, or moving a chair?  Rating(9)   +Driver, -BT, -Dye Allergies.

## 2021-08-19 ENCOUNTER — Other Ambulatory Visit: Payer: Self-pay | Admitting: Physical Medicine and Rehabilitation

## 2021-08-19 ENCOUNTER — Telehealth: Payer: Self-pay | Admitting: Physical Medicine and Rehabilitation

## 2021-08-19 DIAGNOSIS — M810 Age-related osteoporosis without current pathological fracture: Secondary | ICD-10-CM | POA: Diagnosis not present

## 2021-08-19 MED ORDER — BACLOFEN 10 MG PO TABS: 10.0000 mg | ORAL_TABLET | Freq: Three times a day (TID) | ORAL | 0 refills | Status: DC

## 2021-08-19 NOTE — Telephone Encounter (Signed)
Patient called needing Rx refilled Baclofen. The number to contact patient is [phone] 

## 2021-09-05 NOTE — Progress Notes (Signed)
Erica Richardson - 71 y.o. female MRN [id number]  Date of birth: 09-18-1950  Office Visit Note: Visit Date: 08/12/2021 PCP: Sheran Spine, FNP Referred by: Sheran Spine, FNP  Subjective: Chief Complaint  Patient presents with   Lower Back - Pain   HPI:  Erica Richardson is a 71 y.o. female who comes in today at the request of Barnet Pall, FNP for planned Right L4-5 Lumbar Interlaminar epidural steroid injection with fluoroscopic guidance.  The patient has failed conservative care including home exercise, medications, time and activity modification.  This injection will be diagnostic and hopefully therapeutic.  Please see requesting physician notes for further details and justification.   ROS Otherwise per HPI.  Assessment & Plan: Visit Diagnoses:    ICD-10-CM   1. Lumbar radiculopathy  M54.16 XR C-ARM NO REPORT    Epidural Steroid injection    methylPREDNISolone acetate (DEPO-MEDROL) injection 80 mg      Plan: No additional findings.   Meds & Orders:  Meds ordered this encounter  Medications   methylPREDNISolone acetate (DEPO-MEDROL) injection 80 mg    Orders Placed This Encounter  Procedures   XR C-ARM NO REPORT   Epidural Steroid injection    Follow-up: Return if symptoms worsen or fail to improve.   Procedures: No procedures performed  Lumbar Epidural Steroid Injection - Interlaminar Approach with Fluoroscopic Guidance  Patient: Erica Richardson      Date of Birth: 05-01-1950 MRN: [id number] PCP: Sheran Spine, FNP      Visit Date: 08/12/2021   Universal Protocol:     Consent Given By: the patient  Position: PRONE  Additional Comments: Vital signs were monitored before and after the procedure. Patient was prepped and draped in the usual sterile fashion. The correct patient, procedure, and site was verified.   Injection Procedure Details:   Procedure diagnoses: Lumbar radiculopathy [M54.16]   Meds Administered:  Meds ordered this  encounter  Medications   methylPREDNISolone acetate (DEPO-MEDROL) injection 80 mg     Laterality: Right  Location/Site:  L4-5  Needle: 3.5 in., 20 ga. Tuohy  Needle Placement: Paramedian epidural  Findings:   -Comments: Excellent flow of contrast into the epidural space.  Procedure Details: Using a paramedian approach from the side mentioned above, the region overlying the inferior lamina was localized under fluoroscopic visualization and the soft tissues overlying this structure were infiltrated with 4 ml. of 1% Lidocaine without Epinephrine. The Tuohy needle was inserted into the epidural space using a paramedian approach.   The epidural space was localized using loss of resistance along with counter oblique bi-planar fluoroscopic views.  After negative aspirate for air, blood, and CSF, a 2 ml. volume of Isovue-250 was injected into the epidural space and the flow of contrast was observed. Radiographs were obtained for documentation purposes.    The injectate was administered into the level noted above.   Additional Comments:  The patient tolerated the procedure well Dressing: 2 x 2 sterile gauze and Band-Aid    Post-procedure details: Patient was observed during the procedure. Post-procedure instructions were reviewed.  Patient left the clinic in stable condition.   Clinical History: EXAM: MRI LUMBAR SPINE WITHOUT CONTRAST   TECHNIQUE: Multiplanar, multisequence MR imaging of the lumbar spine was performed. No intravenous contrast was administered.   COMPARISON:  11/08/2017 lumbar spine radiographs. 11/24/2011 CT abdomen and pelvis.   FINDINGS: Segmentation:  Standard.   Alignment:  Physiologic.   Vertebrae: No fracture, evidence of discitis, or bone  lesion. Incomplete posterior fusion of the L5 and S1 segments no meningocele or dermal sinus identified.   Conus medullaris and cauda equina: Conus extends to the L5 level. The conus is cleft through the L3  level. Small terminal lipoma at the S2-3 segment level with intervening normal filum terminalis with the conus.   Paraspinal and other soft tissues: Negative.   Disc levels:   T12-L1: No significant disc displacement, foraminal stenosis, or canal stenosis.   L1-2: No significant disc displacement, foraminal stenosis, or canal stenosis.   L2-3: No significant disc displacement, foraminal stenosis, or canal stenosis.   L3-4: No significant disc displacement. Bilateral facet hypertrophy. No foraminal or canal stenosis.   L4-5: Small central protrusion with annular fissure. Bilateral facet hypertrophy. No foraminal or canal stenosis.   L5-S1: No significant disc displacement, foraminal stenosis, or canal stenosis.   IMPRESSION: 1. 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 Small caudal terminal lipoma. Incomplete posterior fusion of L5 and S1 vertebral bodies. No meningocele or dermal sinus. 2. 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 3. Mild lumbar spondylosis. No significant foraminal or canal stenosis.     Electronically Signed   By: Kristine Garbe M.D.   On: 12/15/2017 15:45     Objective:  VS:  HT:    WT:   BMI:     BP:   HR: bpm  TEMP: ( )  RESP:  Physical Exam Vitals and nursing note reviewed.  Constitutional:      General: She is not in acute distress.    Appearance: Normal appearance. She is not ill-appearing.  HENT:     Head: Normocephalic and atraumatic.     Right Ear: External ear normal.     Left Ear: External ear normal.  Eyes:     Extraocular Movements: Extraocular movements intact.  Cardiovascular:     Rate and Rhythm: Normal rate.     Pulses: Normal pulses.  Pulmonary:     Effort: Pulmonary effort is normal. No respiratory distress.  Abdominal:     General: There is no distension.     Palpations: Abdomen is soft.  Musculoskeletal:        General: Tenderness present.     Cervical  back: Neck supple.     Right lower leg: No edema.     Left lower leg: No edema.     Comments: Patient has good distal strength with no pain over the greater trochanters.  No clonus or focal weakness.  Skin:    Findings: No erythema, lesion or rash.  Neurological:     General: No focal deficit present.     Mental Status: She is alert and oriented to person, place, and time.     Sensory: No sensory deficit.     Motor: No weakness or abnormal muscle tone.     Coordination: Coordination normal.  Psychiatric:        Mood and Affect: Mood normal.        Behavior: Behavior normal.      Imaging: No results found.

## 2021-09-05 NOTE — Procedures (Signed)
Lumbar Epidural Steroid Injection - Interlaminar Approach with Fluoroscopic Guidance  Patient: Erica Richardson      Date of Birth: May 09, 1950 MRN: [id number] PCP: Sheran Spine, FNP      Visit Date: 08/12/2021   Universal Protocol:     Consent Given By: the patient  Position: PRONE  Additional Comments: Vital signs were monitored before and after the procedure. Patient was prepped and draped in the usual sterile fashion. The correct patient, procedure, and site was verified.   Injection Procedure Details:   Procedure diagnoses: Lumbar radiculopathy [M54.16]   Meds Administered:  Meds ordered this encounter  Medications   methylPREDNISolone acetate (DEPO-MEDROL) injection 80 mg     Laterality: Right  Location/Site:  L4-5  Needle: 3.5 in., 20 ga. Tuohy  Needle Placement: Paramedian epidural  Findings:   -Comments: Excellent flow of contrast into the epidural space.  Procedure Details: Using a paramedian approach from the side mentioned above, the region overlying the inferior lamina was localized under fluoroscopic visualization and the soft tissues overlying this structure were infiltrated with 4 ml. of 1% Lidocaine without Epinephrine. The Tuohy needle was inserted into the epidural space using a paramedian approach.   The epidural space was localized using loss of resistance along with counter oblique bi-planar fluoroscopic views.  After negative aspirate for air, blood, and CSF, a 2 ml. volume of Isovue-250 was injected into the epidural space and the flow of contrast was observed. Radiographs were obtained for documentation purposes.    The injectate was administered into the level noted above.   Additional Comments:  The patient tolerated the procedure well Dressing: 2 x 2 sterile gauze and Band-Aid    Post-procedure details: Patient was observed during the procedure. Post-procedure instructions were reviewed.  Patient left the clinic in stable  condition.

## 2021-09-06 DIAGNOSIS — J449 Chronic obstructive pulmonary disease, unspecified: Secondary | ICD-10-CM | POA: Diagnosis not present

## 2021-09-06 DIAGNOSIS — F17200 Nicotine dependence, unspecified, uncomplicated: Secondary | ICD-10-CM | POA: Diagnosis not present

## 2021-09-16 DIAGNOSIS — L309 Dermatitis, unspecified: Secondary | ICD-10-CM | POA: Diagnosis not present

## 2021-09-20 ENCOUNTER — Telehealth: Payer: Self-pay | Admitting: Physical Medicine and Rehabilitation

## 2021-09-20 ENCOUNTER — Other Ambulatory Visit: Payer: Self-pay | Admitting: Physical Medicine and Rehabilitation

## 2021-09-20 MED ORDER — BACLOFEN 10 MG PO TABS: 10.0000 mg | ORAL_TABLET | Freq: Three times a day (TID) | ORAL | 0 refills | Status: DC

## 2021-09-20 NOTE — Telephone Encounter (Signed)
Patient called. She would like a refill on the muscle relaxer. Her call back number is [phone]

## 2021-09-21 DIAGNOSIS — L820 Inflamed seborrheic keratosis: Secondary | ICD-10-CM | POA: Diagnosis not present

## 2021-09-22 ENCOUNTER — Ambulatory Visit: Payer: Medicare Other | Admitting: Internal Medicine

## 2021-10-12 DIAGNOSIS — Z1283 Encounter for screening for malignant neoplasm of skin: Secondary | ICD-10-CM | POA: Diagnosis not present

## 2021-10-12 DIAGNOSIS — Z08 Encounter for follow-up examination after completed treatment for malignant neoplasm: Secondary | ICD-10-CM | POA: Diagnosis not present

## 2021-10-12 DIAGNOSIS — D225 Melanocytic nevi of trunk: Secondary | ICD-10-CM | POA: Diagnosis not present

## 2021-10-12 DIAGNOSIS — Z85828 Personal history of other malignant neoplasm of skin: Secondary | ICD-10-CM | POA: Diagnosis not present

## 2021-10-13 ENCOUNTER — Telehealth: Payer: Self-pay | Admitting: Physical Medicine and Rehabilitation

## 2021-10-13 NOTE — Telephone Encounter (Signed)
Pt called and would like a refill on baclofen (LIORESAL

## 2021-10-14 MED ORDER — BACLOFEN 10 MG PO TABS: 10.0000 mg | ORAL_TABLET | Freq: Three times a day (TID) | ORAL | 0 refills | Status: DC

## 2021-10-14 NOTE — Addendum Note (Signed)
Addended by: Raymondo Band on: 10/14/2021 11:06 AM   Modules accepted: Orders

## 2021-11-03 ENCOUNTER — Telehealth: Payer: Self-pay | Admitting: Physical Medicine and Rehabilitation

## 2021-11-03 ENCOUNTER — Other Ambulatory Visit: Payer: Self-pay | Admitting: Physical Medicine and Rehabilitation

## 2021-11-03 MED ORDER — BACLOFEN 10 MG PO TABS: 10.0000 mg | ORAL_TABLET | Freq: Three times a day (TID) | ORAL | 0 refills | Status: DC

## 2021-11-03 NOTE — Telephone Encounter (Signed)
IC advised submitted.  

## 2021-11-03 NOTE — Telephone Encounter (Signed)
Patient called. She would like to know if she could get a refill on baclofen? Her call back number is [phone]

## 2021-11-17 ENCOUNTER — Ambulatory Visit: Payer: Medicare Other | Admitting: Internal Medicine

## 2021-11-23 ENCOUNTER — Other Ambulatory Visit: Payer: Self-pay | Admitting: Physical Medicine and Rehabilitation

## 2021-11-23 ENCOUNTER — Telehealth: Payer: Self-pay | Admitting: Physical Medicine and Rehabilitation

## 2021-11-23 MED ORDER — BACLOFEN 10 MG PO TABS: 10.0000 mg | ORAL_TABLET | Freq: Three times a day (TID) | ORAL | 0 refills | Status: DC

## 2021-11-23 NOTE — Telephone Encounter (Signed)
Pt called and asked for a refill on baclofen (LIORESAL

## 2021-11-26 DIAGNOSIS — Z23 Encounter for immunization: Secondary | ICD-10-CM | POA: Diagnosis not present

## 2021-12-15 ENCOUNTER — Telehealth: Payer: Self-pay | Admitting: Physical Medicine and Rehabilitation

## 2021-12-15 ENCOUNTER — Other Ambulatory Visit: Payer: Self-pay | Admitting: Physical Medicine and Rehabilitation

## 2021-12-15 MED ORDER — BACLOFEN 10 MG PO TABS: 10.0000 mg | ORAL_TABLET | Freq: Three times a day (TID) | ORAL | 0 refills | Status: DC

## 2021-12-15 NOTE — Telephone Encounter (Signed)
Patient notified of medication sent to pharmacy

## 2021-12-15 NOTE — Telephone Encounter (Signed)
Pt called requesting a refill of baclofen. Please send to pharmacy on file. Pt phone number is [phone]

## 2021-12-30 ENCOUNTER — Ambulatory Visit: Payer: Medicare Other | Admitting: Internal Medicine

## 2022-01-11 ENCOUNTER — Other Ambulatory Visit: Payer: Self-pay | Admitting: Physical Medicine and Rehabilitation

## 2022-01-11 ENCOUNTER — Telehealth: Payer: Self-pay | Admitting: Orthopaedic Surgery

## 2022-01-11 MED ORDER — BACLOFEN 10 MG PO TABS: 10.0000 mg | ORAL_TABLET | Freq: Three times a day (TID) | ORAL | 0 refills | Status: DC

## 2022-01-11 NOTE — Telephone Encounter (Signed)
Pt requesting a refill on her Baclofen. Please call [phone]

## 2022-02-07 ENCOUNTER — Telehealth: Payer: Self-pay | Admitting: Physical Medicine and Rehabilitation

## 2022-02-07 MED ORDER — BACLOFEN 10 MG PO TABS: 10.0000 mg | ORAL_TABLET | Freq: Three times a day (TID) | ORAL | 0 refills | Status: DC

## 2022-02-07 NOTE — Telephone Encounter (Signed)
Patient called needing Rx refilled Baclofen. The number to contact patient is [phone]

## 2022-02-07 NOTE — Telephone Encounter (Signed)
Refilled Baclofen

## 2022-02-16 DIAGNOSIS — Z0000 Encounter for general adult medical examination without abnormal findings: Secondary | ICD-10-CM | POA: Diagnosis not present

## 2022-02-16 DIAGNOSIS — M810 Age-related osteoporosis without current pathological fracture: Secondary | ICD-10-CM | POA: Diagnosis not present

## 2022-02-16 DIAGNOSIS — Z23 Encounter for immunization: Secondary | ICD-10-CM | POA: Diagnosis not present

## 2022-02-16 DIAGNOSIS — F17200 Nicotine dependence, unspecified, uncomplicated: Secondary | ICD-10-CM | POA: Diagnosis not present

## 2022-02-16 DIAGNOSIS — J439 Emphysema, unspecified: Secondary | ICD-10-CM | POA: Diagnosis not present

## 2022-02-16 DIAGNOSIS — E785 Hyperlipidemia, unspecified: Secondary | ICD-10-CM | POA: Diagnosis not present

## 2022-02-17 ENCOUNTER — Telehealth: Payer: Self-pay | Admitting: Physical Medicine and Rehabilitation

## 2022-02-17 NOTE — Telephone Encounter (Signed)
Spoke with patient and she is requesting a left hip injection. She states she had one a long time ago and it worked for a long time. Please advise

## 2022-02-17 NOTE — Telephone Encounter (Signed)
Pt called requesting an appt for left hip injection. Please call pt at [phone]

## 2022-02-22 ENCOUNTER — Ambulatory Visit: Payer: Medicare Other | Admitting: Primary Care

## 2022-02-23 ENCOUNTER — Ambulatory Visit (INDEPENDENT_AMBULATORY_CARE_PROVIDER_SITE_OTHER): Payer: Medicare Other

## 2022-02-23 ENCOUNTER — Encounter: Payer: Self-pay | Admitting: Orthopedic Surgery

## 2022-02-23 ENCOUNTER — Telehealth: Payer: Self-pay | Admitting: Physical Medicine and Rehabilitation

## 2022-02-23 ENCOUNTER — Ambulatory Visit (INDEPENDENT_AMBULATORY_CARE_PROVIDER_SITE_OTHER): Payer: Medicare Other | Admitting: Orthopedic Surgery

## 2022-02-23 DIAGNOSIS — M5416 Radiculopathy, lumbar region: Secondary | ICD-10-CM | POA: Diagnosis not present

## 2022-02-23 DIAGNOSIS — M25552 Pain in left hip: Secondary | ICD-10-CM
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
## 2022-02-23 NOTE — Telephone Encounter (Signed)
Patient forgot her appt and needs another appt to see Dr. Ernestina Patches..please advise..[phone]

## 2022-02-23 NOTE — Telephone Encounter (Signed)
Spoke with patient and explained that we have not seen her for her left hip so she would have to be evaluated by Dr. Ninfa Linden or Erskine Emery, PA

## 2022-02-23 NOTE — Telephone Encounter (Signed)
See previous encounter

## 2022-02-28 ENCOUNTER — Telehealth: Payer: Self-pay | Admitting: Orthopedic Surgery

## 2022-02-28 ENCOUNTER — Ambulatory Visit
Admission: RE | Admit: 2022-02-28 | Discharge: 2022-02-28 | Disposition: A | Discharge status: Home / Self Care | Payer: Medicare Other | Source: Ambulatory Visit | Attending: Orthopedic Surgery | Admitting: Orthopedic Surgery

## 2022-02-28 DIAGNOSIS — M545 Low back pain, unspecified: Secondary | ICD-10-CM | POA: Diagnosis not present

## 2022-02-28 DIAGNOSIS — M4807 Spinal stenosis, lumbosacral region: Secondary | ICD-10-CM | POA: Diagnosis not present

## 2022-02-28 DIAGNOSIS — M48061 Spinal stenosis, lumbar region without neurogenic claudication: Secondary | ICD-10-CM | POA: Diagnosis not present

## 2022-02-28 DIAGNOSIS — M5416 Radiculopathy, lumbar region: Secondary | ICD-10-CM

## 2022-02-28 NOTE — Telephone Encounter (Signed)
Pt called requesting instead of coming in office for MRI reading can she get a call with results from [facility] or Dr Marlou Sa. Both ar booked out til end of month. Pt states her MRI is tomorrow. Pt phone number is [phone].

## 2022-02-28 NOTE — Telephone Encounter (Signed)
Holding until MRI completed and resulted. Will discuss with Dr Marlou Sa at that time.

## 2022-03-01 ENCOUNTER — Other Ambulatory Visit: Payer: Self-pay | Admitting: Physical Medicine and Rehabilitation

## 2022-03-01 ENCOUNTER — Telehealth: Payer: Self-pay | Admitting: Physical Medicine and Rehabilitation

## 2022-03-01 MED ORDER — BACLOFEN 10 MG PO TABS: 10.0000 mg | ORAL_TABLET | Freq: Three times a day (TID) | ORAL | 0 refills | Status: DC

## 2022-03-01 NOTE — Telephone Encounter (Signed)
Spoke with patient and informed her that she would have to follow up with Dr. Marlou Sa and Lurena Joiner first to see what type of injection they would like for her to get. Informed her that Baclofen was sent to pharmacy

## 2022-03-01 NOTE — Telephone Encounter (Signed)
Pt also asking for refill of balofen

## 2022-03-01 NOTE — Telephone Encounter (Signed)
Still holding, no results yet available.

## 2022-03-01 NOTE — Telephone Encounter (Signed)
Pt called requesting a appt for injection in left hip. Pt phone number is [phone]

## 2022-03-02 ENCOUNTER — Other Ambulatory Visit: Payer: Self-pay | Admitting: Physical Medicine and Rehabilitation

## 2022-03-02 DIAGNOSIS — M47816 Spondylosis without myelopathy or radiculopathy, lumbar region: Secondary | ICD-10-CM

## 2022-03-02 NOTE — Telephone Encounter (Signed)
I called her.  Her left-sided symptoms do correlate with the findings on the scan.  Can you cancel her appointment with Lurena Joiner and then set her up to see Dr. Ernestina Patches for ESI's.  Thanks

## 2022-03-02 NOTE — Telephone Encounter (Signed)
Appt cancelled and referral placed in chart

## 2022-03-04 ENCOUNTER — Telehealth: Payer: Self-pay | Admitting: Physical Medicine and Rehabilitation

## 2022-03-04 DIAGNOSIS — M810 Age-related osteoporosis without current pathological fracture: Secondary | ICD-10-CM | POA: Diagnosis not present

## 2022-03-04 NOTE — Telephone Encounter (Signed)
Spoke with patient and scheduled injection for 03/09/22

## 2022-03-04 NOTE — Telephone Encounter (Signed)
Patient called. She would like an appointment with Dr. Newton.  

## 2022-03-09 ENCOUNTER — Ambulatory Visit: Payer: Self-pay

## 2022-03-09 ENCOUNTER — Ambulatory Visit (INDEPENDENT_AMBULATORY_CARE_PROVIDER_SITE_OTHER): Payer: Medicare Other | Admitting: Physical Medicine and Rehabilitation

## 2022-03-09 ENCOUNTER — Ambulatory Visit: Payer: Medicare Other | Admitting: Surgical

## 2022-03-09 VITALS — BP 132/82 | HR 60

## 2022-03-09 DIAGNOSIS — M47816 Spondylosis without myelopathy or radiculopathy, lumbar region: Secondary | ICD-10-CM

## 2022-03-09 MED ORDER — METHYLPREDNISOLONE ACETATE 80 MG/ML IJ SUSP
80.0000 mg | Freq: Once | INTRAMUSCULAR | Status: AC
  Administered 2022-03-09: 80 mg

## 2022-03-09 NOTE — Progress Notes (Signed)
Functional Pain Scale - descriptive words and definitions  Distracting (5)    Aware of pain/able to complete some ADL's but limited by pain/sleep is affected and active distractions are only slightly useful. Moderate range order  Average Pain 5   +Driver, -BT, -Dye Allergies.

## 2022-03-09 NOTE — Patient Instructions (Signed)

## 2022-03-14 ENCOUNTER — Ambulatory Visit: Payer: Medicare Other | Admitting: Nurse Practitioner

## 2022-03-21 NOTE — Progress Notes (Signed)
Erica Richardson - 72 y.o. female MRN [id number]  Date of birth: 1950/05/23  Office Visit Note: Visit Date: 03/09/2022 PCP: Sheran Spine, FNP Referred by: Sheran Spine, FNP  Subjective: Chief Complaint  Patient presents with   Lower Back - Pain   HPI:  Erica Richardson is a 72 y.o. female who comes in today for planned repeat Left L3-4, L4-5, and L5-S1 Lumbar facet/medial branch block with fluoroscopic guidance.  The patient has failed conservative care including home exercise, medications, time and activity modification.  This injection will be diagnostic and hopefully therapeutic.  Please see requesting physician notes for further details and justification.  Exam shows concordant low back pain with facet joint loading and extension. Patient received more than 80% pain relief from prior injection. This would be the second block in a diagnostic double block paradigm.     Referring:Megan Jimmye Norman, FNP and Dr. Landry Dyke Dean   ROS Otherwise per HPI.  Assessment & Plan: Visit Diagnoses:    ICD-10-CM   1. Spondylosis without myelopathy or radiculopathy, lumbar region  M47.816 XR C-ARM NO REPORT    Facet Injection    methylPREDNISolone acetate (DEPO-MEDROL) injection 80 mg      Plan: No additional findings.   Meds & Orders:  Meds ordered this encounter  Medications   methylPREDNISolone acetate (DEPO-MEDROL) injection 80 mg    Orders Placed This Encounter  Procedures   Facet Injection   XR C-ARM NO REPORT    Follow-up: Return for visit to requesting provider as needed.   Procedures: No procedures performed  Lumbar Facet Joint Intra-Articular Injection(s) with Fluoroscopic Guidance  Patient: Erica Richardson      Date of Birth: 20-Mar-1950 MRN: [id number] PCP: Sheran Spine, FNP      Visit Date: 03/09/2022   Universal Protocol:    Date/Time: 03/09/2022  Consent Given By: the patient  Position: PRONE   Additional Comments: Vital signs were monitored  before and after the procedure. Patient was prepped and draped in the usual sterile fashion. The correct patient, procedure, and site was verified.   Injection Procedure Details:  Procedure Site One Meds Administered:  Meds ordered this encounter  Medications   methylPREDNISolone acetate (DEPO-MEDROL) injection 80 mg     Laterality: Left  Location/Site:  L3-L4 L4-L5 L5-S1  Needle size: 22 guage  Needle type: Spinal  Needle Placement: Articular  Findings:  -Comments: Excellent flow of contrast producing a partial arthrogram.  Procedure Details: The fluoroscope beam is vertically oriented in AP, and the inferior recess is visualized beneath the lower pole of the inferior apophyseal process, which represents the target point for needle insertion. When direct visualization is difficult the target point is located at the medial projection of the vertebral pedicle. The region overlying each aforementioned target is locally anesthetized with a 1 to 2 ml. volume of 1% Lidocaine without Epinephrine.   The spinal needle was inserted into each of the above mentioned facet joints using biplanar fluoroscopic guidance. A 0.25 to 0.5 ml. volume of Isovue-250 was injected and a partial facet joint arthrogram was obtained. A single spot film was obtained of the resulting arthrogram.    One to 1.25 ml of the steroid/anesthetic solution was then injected into each of the facet joints noted above.   Additional Comments:  No complications occurred Dressing: 2 x 2 sterile gauze and Band-Aid    Post-procedure details: Patient was observed during the procedure. Post-procedure instructions were reviewed.  Patient left the  clinic in stable condition.    Clinical History: MRI LUMBAR SPINE WITHOUT CONTRAST   TECHNIQUE: Multiplanar, multisequence MR imaging of the lumbar spine was performed. No intravenous contrast was administered.   COMPARISON:  Lumbar spine MRI 12/15/2017    FINDINGS: Segmentation: Standard; the lowest formed disc space is designated L5-S1.   Alignment:  Normal.   Vertebrae: Background marrow signal is normal. There is no suspicious marrow signal abnormality or marrow edema. Again seen is incomplete posterior fusion of the posterior elements at L5 and S1.   Conus medullaris and cauda equina: Conus extends to the L5 level with cleft at the midline at the L3 level. The small lipoma at the S2-S3 level is unchanged. Consistent with a tethered cord.   Paraspinal and other soft tissues: There is mild perifacetal soft tissue edema on the left at L3-L4 and L4-L5. There is a small posteriorly projecting synovial cyst arising from the left L5-S1 facet joint.   Disc levels:   The disc heights are overall preserved   T12-L1: There is a shallow right paracentral disc protrusion/extrusion with slight inferior migration but no significant spinal canal or neural foraminal stenosis. The extrusion is new since 2019.   L1-L2: There is a new small right paracentral protrusion and mild facet arthropathy without significant spinal canal or neural foraminal stenosis.   L2-L3: There is a new small central protrusion and mild facet arthropathy without significant spinal canal or neural foraminal stenosis.   L3-L4: There is a mild disc bulge eccentric to the left and moderate left worse than right facet arthropathy resulting in mild-to-moderate left and mild right neural foraminal stenosis without significant spinal canal stenosis. The foraminal stenosis is worsened since 2019.   L4-L5: Mild left worse than right facet arthropathy without significant spinal canal or neural foraminal stenosis, not significantly changed.   L5-S1: There is moderate left and mild right facet arthropathy resulting in mild left worse than right neural foraminal stenosis without significant spinal canal stenosis, not significantly changed.   IMPRESSION: 1.  Multilevel facet arthropathy, most advanced on the left at L3-L4 and L5-S1 with mild perifacetal soft tissue edema at L3-L4 and L4-L5, which may reflect a source of pain. 2. Mild-to-moderate left and mild right neural foraminal stenosis at L3-L4, worsened since 2019. 3. Mild left worse than right neural foraminal stenosis at L5-S1, not significantly changed. 4. New small disc protrusions at T12-L1 through L2-L3 without significant spinal canal or neural foraminal stenosis. 5. Tethered cord extending to the L5 level, unchanged.     Electronically Signed   By: Valetta Mole M.D.   On: 03/01/2022 10:50     Objective:  VS:  HT:    WT:   BMI:     BP:132/82  HR:60bpm  TEMP: ( )  RESP:93 % Physical Exam Vitals and nursing note reviewed.  Constitutional:      General: She is not in acute distress.    Appearance: Normal appearance. She is not ill-appearing.  HENT:     Head: Normocephalic and atraumatic.     Right Ear: External ear normal.     Left Ear: External ear normal.  Eyes:     Extraocular Movements: Extraocular movements intact.  Cardiovascular:     Rate and Rhythm: Normal rate.     Pulses: Normal pulses.  Pulmonary:     Effort: Pulmonary effort is normal. No respiratory distress.  Abdominal:     General: There is no distension.     Palpations: Abdomen is  soft.  Musculoskeletal:        General: Tenderness present.     Cervical back: Neck supple.     Right lower leg: No edema.     Left lower leg: No edema.     Comments: Patient has good distal strength with no pain over the greater trochanters.  No clonus or focal weakness.  Skin:    Findings: No erythema, lesion or rash.  Neurological:     General: No focal deficit present.     Mental Status: She is alert and oriented to person, place, and time.     Sensory: No sensory deficit.     Motor: No weakness or abnormal muscle tone.     Coordination: Coordination normal.  Psychiatric:        Mood and Affect: Mood  normal.        Behavior: Behavior normal.      Imaging: No results found.

## 2022-03-21 NOTE — Procedures (Signed)
Lumbar Facet Joint Intra-Articular Injection(s) with Fluoroscopic Guidance  Patient: Erica Richardson      Date of Birth: 02/28/1950 MRN: [id number] PCP: Sheran Spine, FNP      Visit Date: 03/09/2022   Universal Protocol:    Date/Time: 03/09/2022  Consent Given By: the patient  Position: PRONE   Additional Comments: Vital signs were monitored before and after the procedure. Patient was prepped and draped in the usual sterile fashion. The correct patient, procedure, and site was verified.   Injection Procedure Details:  Procedure Site One Meds Administered:  Meds ordered this encounter  Medications   methylPREDNISolone acetate (DEPO-MEDROL) injection 80 mg     Laterality: Left  Location/Site:  L3-L4 L4-L5 L5-S1  Needle size: 22 guage  Needle type: Spinal  Needle Placement: Articular  Findings:  -Comments: Excellent flow of contrast producing a partial arthrogram.  Procedure Details: The fluoroscope beam is vertically oriented in AP, and the inferior recess is visualized beneath the lower pole of the inferior apophyseal process, which represents the target point for needle insertion. When direct visualization is difficult the target point is located at the medial projection of the vertebral pedicle. The region overlying each aforementioned target is locally anesthetized with a 1 to 2 ml. volume of 1% Lidocaine without Epinephrine.   The spinal needle was inserted into each of the above mentioned facet joints using biplanar fluoroscopic guidance. A 0.25 to 0.5 ml. volume of Isovue-250 was injected and a partial facet joint arthrogram was obtained. A single spot film was obtained of the resulting arthrogram.    One to 1.25 ml of the steroid/anesthetic solution was then injected into each of the facet joints noted above.   Additional Comments:  No complications occurred Dressing: 2 x 2 sterile gauze and Band-Aid    Post-procedure details: Patient was observed  during the procedure. Post-procedure instructions were reviewed.  Patient left the clinic in stable condition.

## 2022-03-22 ENCOUNTER — Other Ambulatory Visit: Payer: Self-pay | Admitting: Physical Medicine and Rehabilitation

## 2022-03-22 ENCOUNTER — Telehealth: Payer: Self-pay | Admitting: Physical Medicine and Rehabilitation

## 2022-03-22 DIAGNOSIS — M7918 Myalgia, other site: Secondary | ICD-10-CM

## 2022-03-22 DIAGNOSIS — M47816 Spondylosis without myelopathy or radiculopathy, lumbar region: Secondary | ICD-10-CM

## 2022-03-22 NOTE — Telephone Encounter (Signed)
Spoke with patient and informed her that an order for physical therapy was placed. She requested it be sent to [facility]. Ordered placed and OV made for 05/03/22

## 2022-03-22 NOTE — Telephone Encounter (Signed)
Patient called in stating Dr told her to let him know if the injections she received worked well or not and they have not and she would like to know what is the next form of action and would PT help

## 2022-03-22 NOTE — Addendum Note (Signed)
Addended by: Barnet Pall E on: 03/22/2022 02:49 PM   Modules accepted: Orders

## 2022-03-28 ENCOUNTER — Ambulatory Visit: Payer: Medicare Other | Admitting: Nurse Practitioner

## 2022-03-29 DIAGNOSIS — J439 Emphysema, unspecified: Secondary | ICD-10-CM | POA: Diagnosis not present

## 2022-03-29 DIAGNOSIS — F17200 Nicotine dependence, unspecified, uncomplicated: Secondary | ICD-10-CM | POA: Diagnosis not present

## 2022-03-29 DIAGNOSIS — J40 Bronchitis, not specified as acute or chronic: Secondary | ICD-10-CM | POA: Diagnosis not present

## 2022-04-05 ENCOUNTER — Telehealth: Payer: Self-pay | Admitting: Physical Medicine and Rehabilitation

## 2022-04-05 NOTE — Telephone Encounter (Signed)
Patient requesting a rx of Baclofen please advise. Patient also states she had Bronchitis and is unable to start P/T at this time but will begin when she is able.

## 2022-04-06 ENCOUNTER — Other Ambulatory Visit: Payer: Self-pay | Admitting: Physical Medicine and Rehabilitation

## 2022-04-06 MED ORDER — BACLOFEN 10 MG PO TABS: 10.0000 mg | ORAL_TABLET | Freq: Three times a day (TID) | ORAL | 0 refills | Status: DC

## 2022-04-11 ENCOUNTER — Ambulatory Visit (INDEPENDENT_AMBULATORY_CARE_PROVIDER_SITE_OTHER): Payer: Medicare Other | Admitting: Internal Medicine

## 2022-04-11 ENCOUNTER — Ambulatory Visit (INDEPENDENT_AMBULATORY_CARE_PROVIDER_SITE_OTHER): Payer: Medicare Other | Admitting: Nurse Practitioner

## 2022-04-11 ENCOUNTER — Encounter: Payer: Self-pay | Admitting: Nurse Practitioner

## 2022-04-11 VITALS — BP 108/62 | HR 63 | Ht 63.0 in | Wt 144.5 lb

## 2022-04-11 DIAGNOSIS — F17200 Nicotine dependence, unspecified, uncomplicated: Secondary | ICD-10-CM | POA: Insufficient documentation

## 2022-04-11 DIAGNOSIS — J439 Emphysema, unspecified: Secondary | ICD-10-CM | POA: Diagnosis not present

## 2022-04-11 DIAGNOSIS — J4489 Other specified chronic obstructive pulmonary disease: Secondary | ICD-10-CM | POA: Diagnosis not present

## 2022-04-11 DIAGNOSIS — Z720 Tobacco use: Secondary | ICD-10-CM

## 2022-04-11 DIAGNOSIS — J449 Chronic obstructive pulmonary disease, unspecified: Secondary | ICD-10-CM

## 2022-04-11 NOTE — Progress Notes (Signed)
Full PFT completed today 

## 2022-04-11 NOTE — Patient Instructions (Addendum)
Continue Albuterol inhaler 2 puffs or 3 mL neb every 6 hours as needed for shortness of breath or wheezing. Notify if symptoms persist despite rescue inhaler/neb use.  Continue Symbicort 2 puffs Twice daily. Brush tongue and rinse mouth afterwards Continue Combivent 1 puff every 6 hours as needed for shortness of breath  Continue flonase nasal spray 2 sprays each nostril daily  Continue loratadine (claritin) 1 tab daily   Your pulmonary function testing showed severe COPD. We will continue you on your current regimen. Monitor your breathing and notify us of any increased shortness of breath, cough or wheezing  Work on quitting smoking  Attend lung cancer screening CT as scheduled on 3/29 at 11:40 am  Follow up in 4-6 months with Dr. Shearon Stalls. If symptoms worsen, please contact office for sooner follow up or seek emergency care.

## 2022-04-11 NOTE — Assessment & Plan Note (Signed)
She has used Chantix but developed nightmares so stopped this. She is currently on citalopram so Wellbutrin would not be appropriate. She has tried nicotine patches without much success. Reviewed other measures to help her quit smoking.   The patient's current tobacco use: 1/2 ppd The patient was advised to quit and impact of smoking on their health.  I assessed the patient's willingness to attempt to quit. I provided methods and skills for cessation. We reviewed medication management of smoking session drugs if appropriate. Resources to help quit smoking were provided. A smoking cessation quit date was set: TBD Follow-up was arranged in our clinic.  The amount of time spent counseling patient was 4 mins

## 2022-04-11 NOTE — Assessment & Plan Note (Signed)
Severe COPD with chronic bronchitis. FEV1 41%, ratio 56. No BD. She is maintained on Symbicort. Rare use of rescue. Compensated on current regimen. Action plan in place. Reviewed correlation between smoking and COPD. Smoking cessation strongly advised.   Patient Instructions  Continue Albuterol inhaler 2 puffs or 3 mL neb every 6 hours as needed for shortness of breath or wheezing. Notify if symptoms persist despite rescue inhaler/neb use.  Continue Symbicort 2 puffs Twice daily. Brush tongue and rinse mouth afterwards Continue Combivent 1 puff every 6 hours as needed for shortness of breath  Continue flonase nasal spray 2 sprays each nostril daily  Continue loratadine (claritin) 1 tab daily   Your pulmonary function testing showed severe COPD. We will continue you on your current regimen. Monitor your breathing and notify us of any increased shortness of breath, cough or wheezing  Work on quitting smoking  Attend lung cancer screening CT as scheduled on 3/29 at 11:40 am  Follow up in 4-6 months with Dr. Shearon Stalls. If symptoms worsen, please contact office for sooner follow up or seek emergency care.
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
## 2022-04-21 DIAGNOSIS — Z1231 Encounter for screening mammogram for malignant neoplasm of breast: Secondary | ICD-10-CM | POA: Diagnosis not present

## 2022-04-24 LAB — PULMONARY FUNCTION TEST
DL/VA % pred: 74 %
DL/VA: 3.11 ml/min/mmHg/L
DLCO cor % pred: 54 %
DLCO cor: 10.36 ml/min/mmHg
DLCO unc % pred: 54 %
DLCO unc: 10.36 ml/min/mmHg
FEF 25-75 Post: 0.53 L/sec
FEF 25-75 Pre: 0.42 L/sec
FEF2575-%Change-Post: 27 %
FEF2575-%Pred-Post: 28 %
FEF2575-%Pred-Pre: 22 %
FEV1-%Change-Post: 7 %
FEV1-%Pred-Post: 44 %
FEV1-%Pred-Pre: 41 %
FEV1-Post: 0.97 L
FEV1-Pre: 0.91 L
FEV1FVC-%Change-Post: 0 %
FEV1FVC-%Pred-Pre: 73 %
FEV6-%Change-Post: 11 %
FEV6-%Pred-Post: 62 %
FEV6-%Pred-Pre: 56 %
FEV6-Post: 1.74 L
FEV6-Pre: 1.56 L
FEV6FVC-%Change-Post: 3 %
FEV6FVC-%Pred-Post: 104 %
FEV6FVC-%Pred-Pre: 101 %
FVC-%Change-Post: 7 %
FVC-%Pred-Post: 59 %
FVC-%Pred-Pre: 55 %
FVC-Post: 1.74 L
Post FEV1/FVC ratio: 56 %
Post FEV6/FVC ratio: 100 %
Pre FEV1/FVC ratio: 56 %
Pre FEV6/FVC Ratio: 97 %
RV % pred: 156 %
RV: 3.3 L
TLC % pred: 102 %
TLC: 5.01 L

## 2022-04-26 DIAGNOSIS — H40013 Open angle with borderline findings, low risk, bilateral: Secondary | ICD-10-CM | POA: Diagnosis not present

## 2022-04-28 ENCOUNTER — Telehealth: Payer: Self-pay | Admitting: Physical Medicine and Rehabilitation

## 2022-04-28 ENCOUNTER — Other Ambulatory Visit: Payer: Self-pay | Admitting: Physical Medicine and Rehabilitation

## 2022-04-28 NOTE — Telephone Encounter (Signed)
Pt called requesting a stronger medication. Pt states tramadol is not working. Please send to [facility] [address] [place] the patient phone [phone] is

## 2022-05-02 DIAGNOSIS — M5442 Lumbago with sciatica, left side: Secondary | ICD-10-CM | POA: Diagnosis not present

## 2022-05-02 DIAGNOSIS — M4807 Spinal stenosis, lumbosacral region: Secondary | ICD-10-CM | POA: Diagnosis not present

## 2022-05-02 NOTE — Telephone Encounter (Signed)
Patient has appointment on 05/03/22

## 2022-05-03 ENCOUNTER — Encounter: Payer: Self-pay | Admitting: Physical Medicine and Rehabilitation

## 2022-05-03 ENCOUNTER — Ambulatory Visit (INDEPENDENT_AMBULATORY_CARE_PROVIDER_SITE_OTHER): Payer: Medicare Other | Admitting: Physical Medicine and Rehabilitation

## 2022-05-03 DIAGNOSIS — M47816 Spondylosis without myelopathy or radiculopathy, lumbar region: Secondary | ICD-10-CM

## 2022-05-03 DIAGNOSIS — M545 Low back pain, unspecified: Secondary | ICD-10-CM

## 2022-05-03 DIAGNOSIS — G894 Chronic pain syndrome: Secondary | ICD-10-CM | POA: Diagnosis not present

## 2022-05-03 MED ORDER — TRAMADOL HCL 50 MG PO TABS: 50.0000 mg | ORAL_TABLET | Freq: Three times a day (TID) | ORAL | 0 refills | Status: DC | PRN

## 2022-05-03 NOTE — Progress Notes (Unsigned)
Functional Pain Scale - descriptive words and definitions  Uncomfortable (3)  Pain is present but can complete all ADL's/sleep is slightly affected and passive distraction only gives marginal relief. Mild range order  Average Pain 4  Lower back pain. Has been using SalonPas, Tylenol, Aspercream, heat and ice for pain. Pain when sitting, standing and lying too long
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
## 2022-05-05 ENCOUNTER — Other Ambulatory Visit: Payer: Self-pay | Admitting: Physical Medicine and Rehabilitation

## 2022-05-05 ENCOUNTER — Telehealth: Payer: Self-pay | Admitting: Physical Medicine and Rehabilitation

## 2022-05-05 MED ORDER — BACLOFEN 10 MG PO TABS: 10.0000 mg | ORAL_TABLET | Freq: Three times a day (TID) | ORAL | 0 refills | Status: DC

## 2022-05-05 NOTE — Telephone Encounter (Signed)
Patient asking to have her Baclofen refilled. Please advise

## 2022-05-11 DIAGNOSIS — M5442 Lumbago with sciatica, left side: Secondary | ICD-10-CM | POA: Diagnosis not present

## 2022-05-11 DIAGNOSIS — M4807 Spinal stenosis, lumbosacral region: Secondary | ICD-10-CM | POA: Diagnosis not present

## 2022-05-16 DIAGNOSIS — M4807 Spinal stenosis, lumbosacral region: Secondary | ICD-10-CM | POA: Diagnosis not present

## 2022-05-16 DIAGNOSIS — M5442 Lumbago with sciatica, left side: Secondary | ICD-10-CM | POA: Diagnosis not present

## 2022-05-18 DIAGNOSIS — M5442 Lumbago with sciatica, left side: Secondary | ICD-10-CM | POA: Diagnosis not present

## 2022-05-18 DIAGNOSIS — M4807 Spinal stenosis, lumbosacral region: Secondary | ICD-10-CM | POA: Diagnosis not present

## 2022-05-20 ENCOUNTER — Ambulatory Visit
Admission: RE | Admit: 2022-05-20 | Discharge: 2022-05-20 | Disposition: A | Discharge status: Home / Self Care | Payer: Medicare Other | Source: Ambulatory Visit | Attending: Family Medicine | Admitting: Family Medicine

## 2022-05-20 DIAGNOSIS — Z87891 Personal history of nicotine dependence: Secondary | ICD-10-CM

## 2022-05-20 DIAGNOSIS — F17210 Nicotine dependence, cigarettes, uncomplicated: Secondary | ICD-10-CM | POA: Diagnosis not present

## 2022-05-23 ENCOUNTER — Other Ambulatory Visit: Payer: Self-pay | Admitting: Acute Care

## 2022-05-23 DIAGNOSIS — Z87891 Personal history of nicotine dependence: Secondary | ICD-10-CM

## 2022-05-23 DIAGNOSIS — F17210 Nicotine dependence, cigarettes, uncomplicated: Secondary | ICD-10-CM

## 2022-05-23 DIAGNOSIS — Z122 Encounter for screening for malignant neoplasm of respiratory organs: Secondary | ICD-10-CM

## 2022-05-24 ENCOUNTER — Other Ambulatory Visit: Payer: Self-pay

## 2022-05-24 ENCOUNTER — Ambulatory Visit (INDEPENDENT_AMBULATORY_CARE_PROVIDER_SITE_OTHER): Payer: Medicare Other | Admitting: Physical Medicine and Rehabilitation

## 2022-05-24 VITALS — BP 151/92 | HR 53

## 2022-05-24 DIAGNOSIS — M47816 Spondylosis without myelopathy or radiculopathy, lumbar region: Secondary | ICD-10-CM

## 2022-05-24 MED ORDER — BUPIVACAINE HCL 0.5 % IJ SOLN
3.0000 mL | Freq: Once | INTRAMUSCULAR | Status: AC
  Administered 2022-05-24: 3 mL

## 2022-05-24 NOTE — Progress Notes (Signed)
Functional Pain Scale - descriptive words and definitions  Distressing (6)    Pain is present/unable to complete most ADLs limited by pain/sleep is difficult and active distraction is only marginal. Moderate range order  Average Pain  varies, 9-10 at night   +Driver, -BT, -Dye Allergies.  Lower back pain on left side with numbness in leg. Sitting and lying down makes pain worse. Ice and Tylenol helps pain

## 2022-05-24 NOTE — Patient Instructions (Signed)

## 2022-05-25 ENCOUNTER — Telehealth: Payer: Self-pay | Admitting: Acute Care

## 2022-05-25 DIAGNOSIS — M4807 Spinal stenosis, lumbosacral region: Secondary | ICD-10-CM | POA: Diagnosis not present

## 2022-05-25 DIAGNOSIS — M5442 Lumbago with sciatica, left side: Secondary | ICD-10-CM | POA: Diagnosis not present

## 2022-05-25 MED ORDER — SYMBICORT 80-4.5 MCG/ACT IN AERO: 2.0000 | INHALATION_SPRAY | Freq: Two times a day (BID) | RESPIRATORY_TRACT | 0 refills | Status: DC

## 2022-05-25 NOTE — Telephone Encounter (Signed)
Called and spoke with patient. Patient verbalized understanding. Patient already has an appointment with SG this month. Rx has been sent to the patients pharmacy.   Nothing further needed.

## 2022-05-25 NOTE — Telephone Encounter (Signed)
Dr. Shearon Stalls, Patient is requesting a refill of symbicort please advise if this is okay since this has never been filled here

## 2022-05-25 NOTE — Telephone Encounter (Signed)
Ok to give her a one month supply but then she needs an appointment with me to be seen.

## 2022-05-31 ENCOUNTER — Telehealth: Payer: Self-pay | Admitting: Acute Care

## 2022-05-31 ENCOUNTER — Ambulatory Visit: Payer: Medicare Other | Admitting: Acute Care

## 2022-05-31 ENCOUNTER — Other Ambulatory Visit (HOSPITAL_COMMUNITY): Payer: Self-pay

## 2022-05-31 NOTE — Telephone Encounter (Signed)
Patient states [facility] needs prior authorization for Symbicort. Patient phone number is [phone].

## 2022-05-31 NOTE — Telephone Encounter (Signed)
Brand Symbicort is covered.

## 2022-06-01 ENCOUNTER — Other Ambulatory Visit: Payer: Self-pay | Admitting: *Deleted

## 2022-06-01 MED ORDER — SYMBICORT 80-4.5 MCG/ACT IN AERO
2.0000 | INHALATION_SPRAY | Freq: Two times a day (BID) | RESPIRATORY_TRACT | 3 refills | Status: DC
Start: 2022-06-01 — End: 2023-04-27

## 2022-06-01 NOTE — Telephone Encounter (Signed)
Spoke with the pt  She states that the pharmacy called her and told her that we have to call them in order for her to get symbicort filled  Called pharm and they needed to verify allergies  This was done and nothing further needed

## 2022-06-06 NOTE — Procedures (Signed)
Lumbar Facet Joint Intra-Articular Injection(s) with Fluoroscopic Guidance  Patient: Erica Richardson      Date of Birth: [birth year] MRN: [id number] PCP: Genia Hotter, FNP      Visit Date: 05/24/2022   Universal Protocol:    Date/Time: 05/24/2022  Consent Given By: the patient  Position: PRONE   Additional Comments: Vital signs were monitored before and after the procedure. Patient was prepped and draped in the usual sterile fashion. The correct patient, procedure, and site was verified.   Injection Procedure Details:  Procedure Site One Meds Administered:  Meds ordered this encounter  Medications   bupivacaine (MARCAINE) 0.5 % (with pres) injection 3 mL     Laterality: Left  Location/Site:  L3-L4 L4-L5 L5-S1  Needle size: 22 guage  Needle type: Spinal  Needle Placement: Articular  Findings:  -Comments: Excellent flow of contrast producing a partial arthrogram.  Procedure Details: The fluoroscope beam is vertically oriented in AP, and the inferior recess is visualized beneath the lower pole of the inferior apophyseal process, which represents the target point for needle insertion. When direct visualization is difficult the target point is located at the medial projection of the vertebral pedicle. The region overlying each aforementioned target is locally anesthetized with a 1 to 2 ml. volume of 1% Lidocaine without Epinephrine.   The spinal needle was inserted into each of the above mentioned facet joints using biplanar fluoroscopic guidance. A 0.25 to 0.5 ml. volume of Isovue-250 was injected and a partial facet joint arthrogram was obtained. A single spot film was obtained of the resulting arthrogram.    One to 1.25 ml of the steroid/anesthetic solution was then injected into each of the facet joints noted above.   Additional Comments:  No complications occurred Dressing: 2 x 2 sterile gauze and Band-Aid    Post-procedure details: Patient was observed  during the procedure. Post-procedure instructions were reviewed.  Patient left the clinic in stable condition.

## 2022-06-06 NOTE — Progress Notes (Signed)
Erica Richardson - 72 y.o. female MRN [id number]  Date of birth: 1950-08-11  Office Visit Note: Visit Date: 05/24/2022 PCP: Genia Hotter, FNP Referred by: Genia Hotter, FNP  Subjective: Chief Complaint  Patient presents with   Lower Back - Pain   HPI:  Erica Richardson is a 72 y.o. female who comes in today for planned repeat Left L3-4, L4-5, and L5-S1 Lumbar facet/medial branch block with fluoroscopic guidance.  The patient has failed conservative care including home exercise, medications, time and activity modification.  This injection will be diagnostic and hopefully therapeutic.  Please see requesting physician notes for further details and justification.  Exam shows concordant low back pain with facet joint loading and extension. Patient received more than 80% pain relief from prior injection. This would be the second block in a diagnostic double block paradigm.     Referring:Megan Mayford Knife, FNP   ROS Otherwise per HPI.  Assessment & Plan: Visit Diagnoses:    ICD-10-CM   1. Spondylosis without myelopathy or radiculopathy, lumbar region  M47.816 XR C-ARM NO REPORT    Facet Injection    bupivacaine (MARCAINE) 0.5 % (with pres) injection 3 mL      Plan: No additional findings.   Meds & Orders:  Meds ordered this encounter  Medications   bupivacaine (MARCAINE) 0.5 % (with pres) injection 3 mL    Orders Placed This Encounter  Procedures   Facet Injection   XR C-ARM NO REPORT    Follow-up: Return for visit to requesting provider as needed.   Procedures: No procedures performed  Lumbar Facet Joint Intra-Articular Injection(s) with Fluoroscopic Guidance  Patient: Erica Richardson      Date of Birth: September 07, 1950 MRN: [id number] PCP: Genia Hotter, FNP      Visit Date: 05/24/2022   Universal Protocol:    Date/Time: 05/24/2022  Consent Given By: the patient  Position: PRONE   Additional Comments: Vital signs were monitored before and after the  procedure. Patient was prepped and draped in the usual sterile fashion. The correct patient, procedure, and site was verified.   Injection Procedure Details:  Procedure Site One Meds Administered:  Meds ordered this encounter  Medications   bupivacaine (MARCAINE) 0.5 % (with pres) injection 3 mL     Laterality: Left  Location/Site:  L3-L4 L4-L5 L5-S1  Needle size: 22 guage  Needle type: Spinal  Needle Placement: Articular  Findings:  -Comments: Excellent flow of contrast producing a partial arthrogram.  Procedure Details: The fluoroscope beam is vertically oriented in AP, and the inferior recess is visualized beneath the lower pole of the inferior apophyseal process, which represents the target point for needle insertion. When direct visualization is difficult the target point is located at the medial projection of the vertebral pedicle. The region overlying each aforementioned target is locally anesthetized with a 1 to 2 ml. volume of 1% Lidocaine without Epinephrine.   The spinal needle was inserted into each of the above mentioned facet joints using biplanar fluoroscopic guidance. A 0.25 to 0.5 ml. volume of Isovue-250 was injected and a partial facet joint arthrogram was obtained. A single spot film was obtained of the resulting arthrogram.    One to 1.25 ml of the steroid/anesthetic solution was then injected into each of the facet joints noted above.   Additional Comments:  No complications occurred Dressing: 2 x 2 sterile gauze and Band-Aid    Post-procedure details: Patient was observed during the procedure. Post-procedure instructions were reviewed.  Patient left the clinic in stable condition.    Clinical History: MRI LUMBAR SPINE WITHOUT CONTRAST   TECHNIQUE: Multiplanar, multisequence MR imaging of the lumbar spine was performed. No intravenous contrast was administered.   COMPARISON:  Lumbar spine MRI 12/15/2017   FINDINGS: Segmentation: Standard;  the lowest formed disc space is designated L5-S1.   Alignment:  Normal.   Vertebrae: Background marrow signal is normal. There is no suspicious marrow signal abnormality or marrow edema. Again seen is incomplete posterior fusion of the posterior elements at L5 and S1.   Conus medullaris and cauda equina: Conus extends to the L5 level with cleft at the midline at the L3 level. The small lipoma at the S2-S3 level is unchanged. Consistent with a tethered cord.   Paraspinal and other soft tissues: There is mild perifacetal soft tissue edema on the left at L3-L4 and L4-L5. There is a small posteriorly projecting synovial cyst arising from the left L5-S1 facet joint.   Disc levels:   The disc heights are overall preserved   T12-L1: There is a shallow right paracentral disc protrusion/extrusion with slight inferior migration but no significant spinal canal or neural foraminal stenosis. The extrusion is new since 2019.   L1-L2: There is a new small right paracentral protrusion and mild facet arthropathy without significant spinal canal or neural foraminal stenosis.   L2-L3: There is a new small central protrusion and mild facet arthropathy without significant spinal canal or neural foraminal stenosis.   L3-L4: There is a mild disc bulge eccentric to the left and moderate left worse than right facet arthropathy resulting in mild-to-moderate left and mild right neural foraminal stenosis without significant spinal canal stenosis. The foraminal stenosis is worsened since 2019.   L4-L5: Mild left worse than right facet arthropathy without significant spinal canal or neural foraminal stenosis, not significantly changed.   L5-S1: There is moderate left and mild right facet arthropathy resulting in mild left worse than right neural foraminal stenosis without significant spinal canal stenosis, not significantly changed.   IMPRESSION: 1. Multilevel facet arthropathy, most advanced on  the left at L3-L4 and L5-S1 with mild perifacetal soft tissue edema at L3-L4 and L4-L5, which may reflect a source of pain. 2. Mild-to-moderate left and mild right neural foraminal stenosis at L3-L4, worsened since 2019. 3. Mild left worse than right neural foraminal stenosis at L5-S1, not significantly changed. 4. New small disc protrusions at T12-L1 through L2-L3 without significant spinal canal or neural foraminal stenosis. 5. Tethered cord extending to the L5 level, unchanged.     Electronically Signed   By: Lesia Hausen M.D.   On: 03/01/2022 10:50     Objective:  VS:  HT:    WT:   BMI:     BP:(!) 151/92  HR:(!) 53bpm  TEMP: ( )  RESP:  Physical Exam Vitals and nursing note reviewed.  Constitutional:      General: She is not in acute distress.    Appearance: Normal appearance. She is well-developed. She is not ill-appearing.  HENT:     Head: Normocephalic and atraumatic.     Right Ear: External ear normal.     Left Ear: External ear normal.  Eyes:     Extraocular Movements: Extraocular movements intact.     Conjunctiva/sclera: Conjunctivae normal.     Pupils: Pupils are equal, round, and reactive to light.  Cardiovascular:     Rate and Rhythm: Normal rate.     Pulses: Normal pulses.  Pulmonary:  Effort: Pulmonary effort is normal. No respiratory distress.  Abdominal:     General: There is no distension.     Palpations: Abdomen is soft.  Musculoskeletal:        General: Tenderness present.     Cervical back: Neck supple.     Right lower leg: No edema.     Left lower leg: No edema.     Comments: Patient has good distal strength with no pain over the greater trochanters.  No clonus or focal weakness.  Skin:    General: Skin is warm and dry.     Findings: No erythema, lesion or rash.  Neurological:     General: No focal deficit present.     Mental Status: She is alert and oriented to person, place, and time.     Sensory: No sensory deficit.     Motor: No  weakness or abnormal muscle tone.     Coordination: Coordination normal.     Gait: Gait normal.  Psychiatric:        Mood and Affect: Mood normal.        Behavior: Behavior normal.      Imaging: No results found.

## 2022-06-21 ENCOUNTER — Telehealth: Payer: Self-pay | Admitting: Physical Medicine and Rehabilitation

## 2022-06-21 NOTE — Telephone Encounter (Signed)
Patient asking to get an appointment with Dr. Alvester Morin. Please advise

## 2022-06-23 NOTE — Telephone Encounter (Signed)
Spoke with patient and she is requesting another injection. Her last facet injection lasted 2-3 week with more than 75% relief. Please advise

## 2022-06-24 ENCOUNTER — Telehealth: Payer: Self-pay | Admitting: Physical Medicine and Rehabilitation

## 2022-06-24 ENCOUNTER — Other Ambulatory Visit: Payer: Self-pay | Admitting: Physical Medicine and Rehabilitation

## 2022-06-24 ENCOUNTER — Ambulatory Visit: Payer: Medicare Other | Admitting: Acute Care

## 2022-06-24 DIAGNOSIS — M545 Low back pain, unspecified: Secondary | ICD-10-CM

## 2022-06-24 DIAGNOSIS — G8929 Other chronic pain: Secondary | ICD-10-CM

## 2022-06-24 DIAGNOSIS — M47816 Spondylosis without myelopathy or radiculopathy, lumbar region: Secondary | ICD-10-CM

## 2022-06-24 NOTE — Telephone Encounter (Signed)
Patient would like to repeat RFA

## 2022-06-24 NOTE — Progress Notes (Signed)
Patient reports greater than 80% relief with recent left L3-L4, L4-L5 and L5-S1 diagnostic facet blocks. We will proceed with radiofrequency ablation.

## 2022-06-24 NOTE — Telephone Encounter (Signed)
Patient called. Says she is going to pain management at [facility] and their number is [phone]

## 2022-06-27 NOTE — Telephone Encounter (Signed)
Spoke with patient and she still wants to try the RFA before going to pain management

## 2022-06-27 NOTE — Telephone Encounter (Signed)
LVM to return call to get more information 

## 2022-06-28 IMAGING — CT CT CHEST LUNG CANCER SCREENING LOW DOSE W/O CM
1 series · 10 of 10 positions shown, 13 images · non-contrast
Comparison: 03/18/2020.

CLINICAL DATA: Current smoker, 40 pack-year history.



[ct lung segmentation data · axial · 0.64mm/px · z∈[-170,-170]mm · 10 of 330 frames shown]
[frame 1/330  mediastinal]
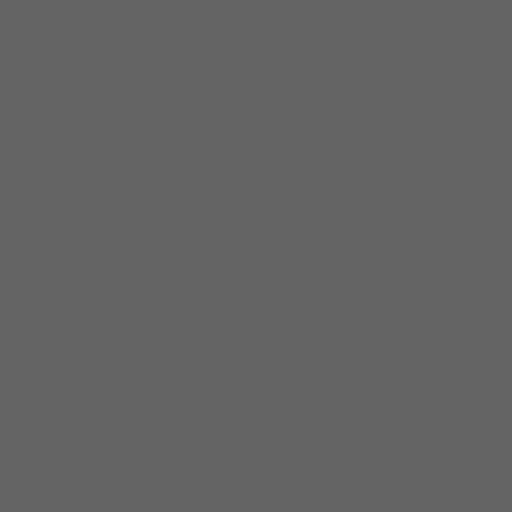
[frame 1/330  lung]
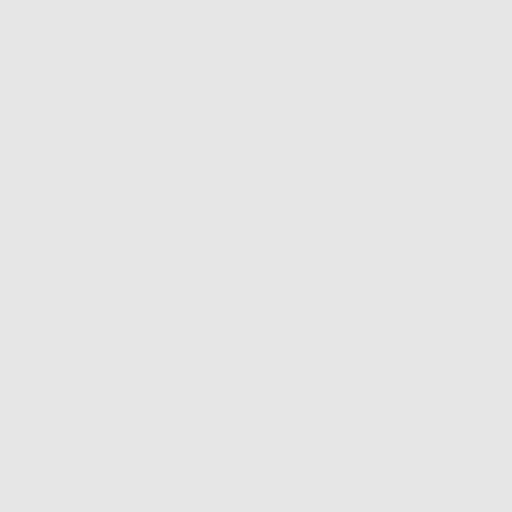
[frame 37/330  lung]
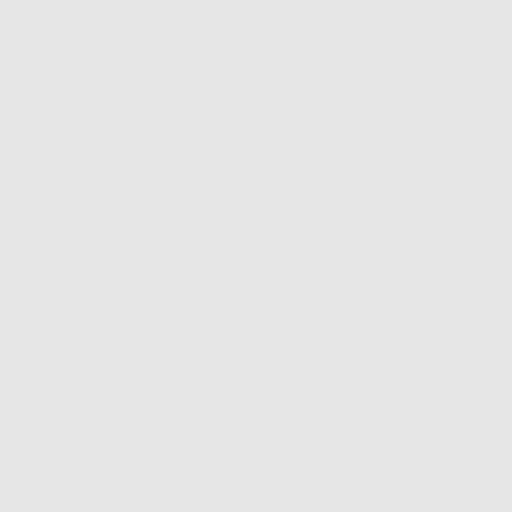
[frame 74/330  lung]
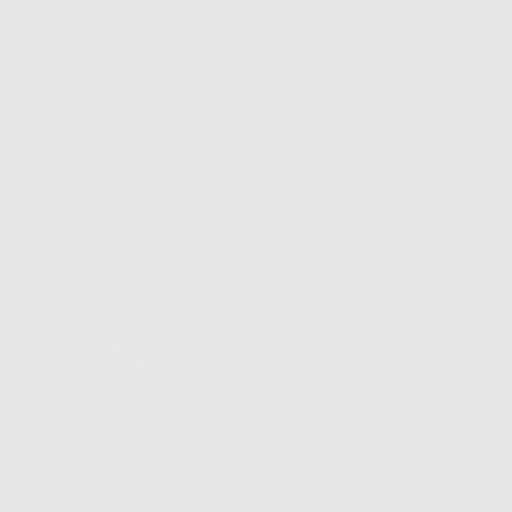
[frame 110/330  lung]
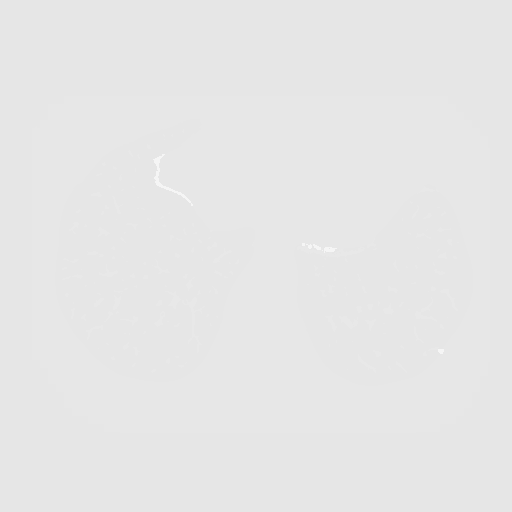
[frame 147/330  mediastinal]
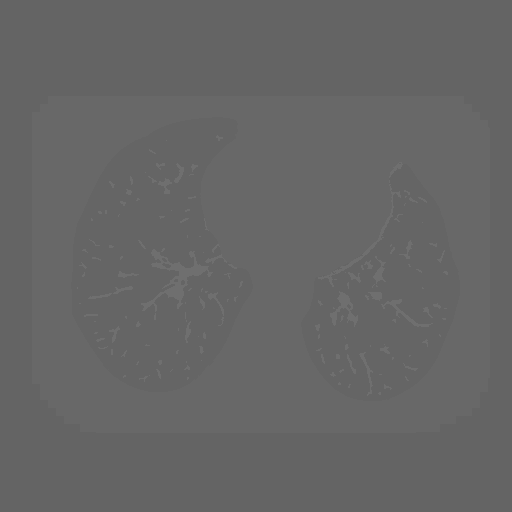
[frame 147/330  lung]
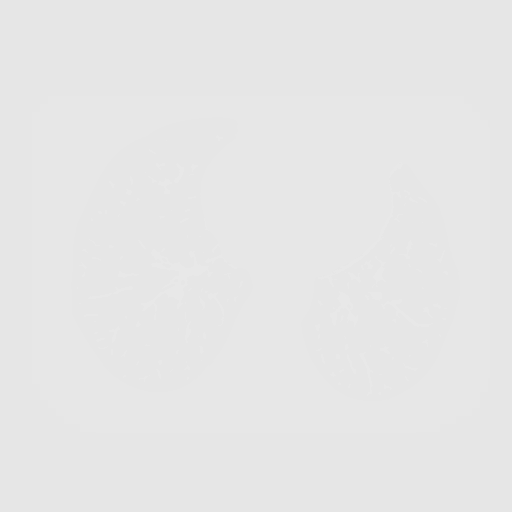
[frame 183/330  lung]
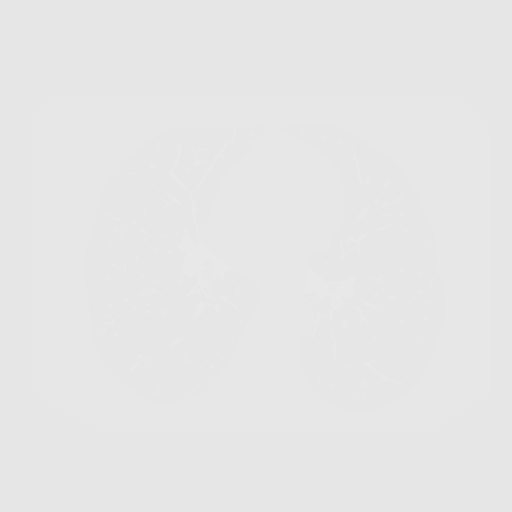
[frame 220/330  lung]
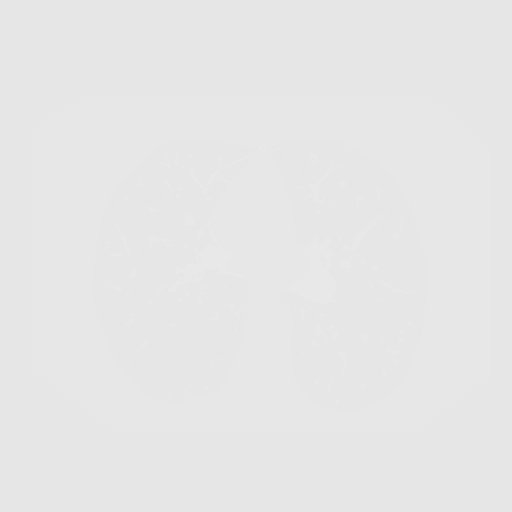
[frame 256/330  lung]
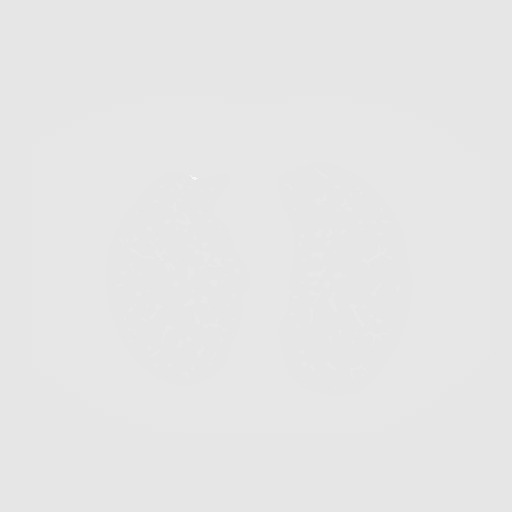
[frame 293/330  mediastinal]
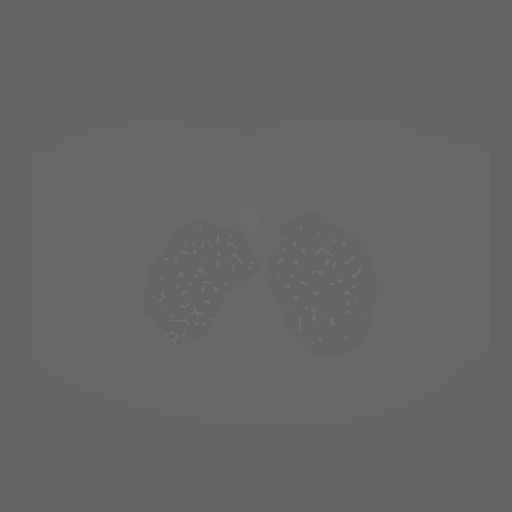
[frame 293/330  lung]
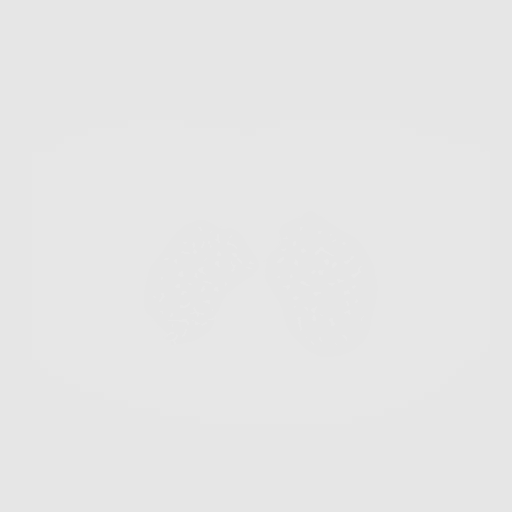
[frame 330/330  lung]
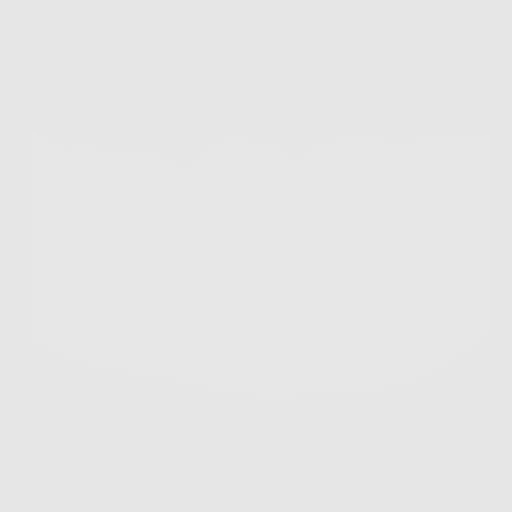

[10 of 10 positions shown; findings below may reference images not displayed]

FINDINGS: Cardiovascular: Heart size normal.  No pericardial effusion.

Mediastinum/Nodes: No pathologically enlarged mediastinal or
axillary lymph nodes. Hilar regions are difficult to definitively
evaluate without IV contrast but appear grossly unremarkable.
Esophagus is grossly unremarkable.

Lungs/Pleura: Centrilobular and paraseptal emphysema. Mild smoking
related respiratory bronchiolitis. Pulmonary nodules measure 4.0 mm
or less in size, as before. No suspicious pulmonary nodules. No
pleural fluid. Minimal debris in the airway.

Upper Abdomen: Visualized portions of the liver, gallbladder,
adrenal glands and right kidney are unremarkable. 1.4 cm low-density
lesion in the left kidney is likely a cyst. No follow-up necessary.
Visualized portions of the spleen, pancreas, stomach and bowel are
grossly unremarkable.

Musculoskeletal: Mild degenerative changes in the spine. No
worrisome lytic or sclerotic lesions.
IMPRESSION: 1. Lung-RADS 2, benign appearance or behavior. Continue annual
screening with low-dose chest CT without contrast in 12 months.
2.  Emphysema ([id number]).

## 2022-07-05 ENCOUNTER — Ambulatory Visit: Payer: Medicare Other | Admitting: Internal Medicine

## 2022-07-08 DIAGNOSIS — F329 Major depressive disorder, single episode, unspecified: Secondary | ICD-10-CM | POA: Diagnosis not present

## 2022-07-08 DIAGNOSIS — F17210 Nicotine dependence, cigarettes, uncomplicated: Secondary | ICD-10-CM | POA: Diagnosis not present

## 2022-07-08 DIAGNOSIS — J439 Emphysema, unspecified: Secondary | ICD-10-CM | POA: Diagnosis not present

## 2022-07-11 ENCOUNTER — Ambulatory Visit (INDEPENDENT_AMBULATORY_CARE_PROVIDER_SITE_OTHER): Payer: Medicare Other | Admitting: Physical Medicine and Rehabilitation

## 2022-07-11 ENCOUNTER — Other Ambulatory Visit: Payer: Self-pay

## 2022-07-11 DIAGNOSIS — M47816 Spondylosis without myelopathy or radiculopathy, lumbar region: Secondary | ICD-10-CM

## 2022-07-11 MED ORDER — BACLOFEN 10 MG PO TABS: 10.0000 mg | ORAL_TABLET | Freq: Three times a day (TID) | ORAL | 0 refills | Status: DC

## 2022-07-11 MED ORDER — METHYLPREDNISOLONE ACETATE 80 MG/ML IJ SUSP
80.0000 mg | Freq: Once | INTRAMUSCULAR | Status: AC
  Administered 2022-07-11: 80 mg

## 2022-07-11 NOTE — Progress Notes (Signed)
Functional Pain Scale - descriptive words and definitions  Distressing (6)    Pain is present/unable to complete most ADLs limited by pain/sleep is difficult and active distraction is only marginal. Moderate range order  Average Pain 6  Pain returned after last trip. Seems to be intensifying. Left side today.

## 2022-07-11 NOTE — Progress Notes (Unsigned)
Erica Richardson - 72 y.o. female MRN [id number]  Date of birth: 09-10-1950  Office Visit Note: Visit Date: 07/11/2022 PCP: Genia Hotter, FNP Referred by: Genia Hotter, FNP  Subjective: Chief Complaint  Patient presents with   Lower Back - Pain   HPI:  Erica Richardson is a 72 y.o. female who comes in todayfor planned repeat radiofrequency ablation of the Left L3-4, L4-5, and L5-S1  Lumbar facet joints. This would be ablation of the corresponding medial branches and/or dorsal rami.  Patient has had double diagnostic blocks with more than 70% relief.  Subsequent ablation gave them more than 6 months of over 60% relief.  They have had chronic back pain for quite some time, more than 3 months, which has been an ongoing situation with recalcitrant axial back pain.  They have no radicular pain.  Their axial pain is worse with standing and ambulating and on exam today with facet loading.  They have had physical therapy as well as home exercise program.  The imaging noted in the chart below indicated facet pathology. Accordingly they meet all the criteria and qualification for for radiofrequency ablation and we are going to complete this today hopefully for more longer term relief as part of comprehensive management program.   ROS Otherwise per HPI.  Assessment & Plan: Visit Diagnoses:    ICD-10-CM   1. Spondylosis without myelopathy or radiculopathy, lumbar region  M47.816 XR C-ARM NO REPORT    Facet Injection    methylPREDNISolone acetate (DEPO-MEDROL) injection 80 mg    CANCELED: Small Joint Inj      Plan: No additional findings.   Meds & Orders:  Meds ordered this encounter  Medications   methylPREDNISolone acetate (DEPO-MEDROL) injection 80 mg   baclofen (LIORESAL) 10 MG tablet    Sig: Take 1 tablet (10 mg total) by mouth 3 (three) times daily.    Dispense:  30 each    Refill:  0    Orders Placed This Encounter  Procedures   Facet Injection   XR C-ARM NO REPORT     Follow-up: Return for visit to requesting provider as needed.   Procedures: No procedures performed  Lumbar Facet Joint Nerve Denervation  Patient: Erica Richardson      Date of Birth: [birth year] MRN: [id number] PCP: Genia Hotter, FNP      Visit Date: 07/11/2022   Universal Protocol:    Date/Time: 05/21/245:45 AM  Consent Given By: the patient  Position: PRONE  Additional Comments: Vital signs were monitored before and after the procedure. Patient was prepped and draped in the usual sterile fashion. The correct patient, procedure, and site was verified.   Injection Procedure Details:   Procedure diagnoses:  1. Spondylosis without myelopathy or radiculopathy, lumbar region      Meds Administered:  Meds ordered this encounter  Medications   methylPREDNISolone acetate (DEPO-MEDROL) injection 80 mg   baclofen (LIORESAL) 10 MG tablet    Sig: Take 1 tablet (10 mg total) by mouth 3 (three) times daily.    Dispense:  30 each    Refill:  0     Laterality: Left  Location/Site:  L3-L4, L2 and L3 medial branches, L4-L5, L3 and L4 medial branches, and L5-S1, L4 medial branch and L5 dorsal ramus  Needle: 18 ga.,  10mm active tip, RF Cannula  Needle Placement: Along juncture of superior articular process and transverse pocess  Findings:  -Comments: Patient does not really have significant trigger points  today with positioning the needle down through the multifidus to the facet joint.  At L5 position she had significant excruciating pain at the quadratus lumborum site.  She was very anxious when this pain occurred to the point where he really had a hard time getting her to calm down.  I remove the needle at that point after confirming placement which was posterior to the spine in the muscle area.  I reinserted the needle at a different angle and she had no issues with that at all.  There were no paresthesias or pain in the leg.  Procedure Details: For each desired  target nerve, the corresponding transverse process (sacral ala for the L5 dorsal rami) was identified and the fluoroscope was positioned to square off the endplates of the corresponding vertebral body to achieve a true AP midline view.  The beam was then obliqued 15 to 20 degrees and caudally tilted 15 to 20 degrees to line up a trajectory along the target nerves. The skin over the target of the junction of superior articulating process and transverse process (sacral ala for the L5 dorsal rami) was infiltrated with 1ml of 1% Lidocaine without Epinephrine.  The 18 gauge 10mm active tip outer cannula was advanced in trajectory view to the target.  This procedure was repeated for each target nerve.  Then, for all levels, the outer cannula placement was fine-tuned and the position was then confirmed with bi-planar imaging.    Test stimulation was done both at sensory and motor levels to ensure there was no radicular stimulation. The target tissues were then infiltrated with 1 ml of 1% Lidocaine without Epinephrine. Subsequently, a percutaneous neurotomy was carried out for 90 seconds at 80 degrees Celsius.  After the completion of the lesion, 1 ml of injectate was delivered. It was then repeated for each facet joint nerve mentioned above. Appropriate radiographs were obtained to verify the probe placement during the neurotomy.   Additional Comments:  No complications occurred Dressing: 2 x 2 sterile gauze and Band-Aid    Post-procedure details: Patient was observed during the procedure. Post-procedure instructions were reviewed.  Patient left the clinic in stable condition.      Clinical History: MRI LUMBAR SPINE WITHOUT CONTRAST   TECHNIQUE: Multiplanar, multisequence MR imaging of the lumbar spine was performed. No intravenous contrast was administered.   COMPARISON:  Lumbar spine MRI 12/15/2017   FINDINGS: Segmentation: Standard; the lowest formed disc space is designated L5-S1.    Alignment:  Normal.   Vertebrae: Background marrow signal is normal. There is no suspicious marrow signal abnormality or marrow edema. Again seen is incomplete posterior fusion of the posterior elements at L5 and S1.   Conus medullaris and cauda equina: Conus extends to the L5 level with cleft at the midline at the L3 level. The small lipoma at the S2-S3 level is unchanged. Consistent with a tethered cord.   Paraspinal and other soft tissues: There is mild perifacetal soft tissue edema on the left at L3-L4 and L4-L5. There is a small posteriorly projecting synovial cyst arising from the left L5-S1 facet joint.   Disc levels:   The disc heights are overall preserved   T12-L1: There is a shallow right paracentral disc protrusion/extrusion with slight inferior migration but no significant spinal canal or neural foraminal stenosis. The extrusion is new since 2019.   L1-L2: There is a new small right paracentral protrusion and mild facet arthropathy without significant spinal canal or neural foraminal stenosis.   L2-L3: There  is a new small central protrusion and mild facet arthropathy without significant spinal canal or neural foraminal stenosis.   L3-L4: There is a mild disc bulge eccentric to the left and moderate left worse than right facet arthropathy resulting in mild-to-moderate left and mild right neural foraminal stenosis without significant spinal canal stenosis. The foraminal stenosis is worsened since 2019.   L4-L5: Mild left worse than right facet arthropathy without significant spinal canal or neural foraminal stenosis, not significantly changed.   L5-S1: There is moderate left and mild right facet arthropathy resulting in mild left worse than right neural foraminal stenosis without significant spinal canal stenosis, not significantly changed.   IMPRESSION: 1. Multilevel facet arthropathy, most advanced on the left at L3-L4 and L5-S1 with mild perifacetal  soft tissue edema at L3-L4 and L4-L5, which may reflect a source of pain. 2. Mild-to-moderate left and mild right neural foraminal stenosis at L3-L4, worsened since 2019. 3. Mild left worse than right neural foraminal stenosis at L5-S1, not significantly changed. 4. New small disc protrusions at T12-L1 through L2-L3 without significant spinal canal or neural foraminal stenosis. 5. Tethered cord extending to the L5 level, unchanged.     Electronically Signed   By: Lesia Hausen M.D.   On: 03/01/2022 10:50     Objective:  VS:  HT:    WT:   BMI:     BP:   HR: bpm  TEMP: ( )  RESP:  Physical Exam Vitals and nursing note reviewed.  Constitutional:      General: She is not in acute distress.    Appearance: Normal appearance. She is not ill-appearing.  HENT:     Head: Normocephalic and atraumatic.     Right Ear: External ear normal.     Left Ear: External ear normal.  Eyes:     Extraocular Movements: Extraocular movements intact.  Cardiovascular:     Rate and Rhythm: Normal rate.     Pulses: Normal pulses.  Pulmonary:     Effort: Pulmonary effort is normal. No respiratory distress.  Abdominal:     General: There is no distension.     Palpations: Abdomen is soft.  Musculoskeletal:        General: Tenderness present.     Cervical back: Neck supple.     Right lower leg: No edema.     Left lower leg: No edema.     Comments: Patient has good distal strength with no pain over the greater trochanters.  No clonus or focal weakness.  Skin:    Findings: No erythema, lesion or rash.  Neurological:     General: No focal deficit present.     Mental Status: She is alert and oriented to person, place, and time.     Sensory: No sensory deficit.     Motor: No weakness or abnormal muscle tone.     Coordination: Coordination normal.  Psychiatric:        Mood and Affect: Mood normal.        Behavior: Behavior normal.      Imaging: No results found.

## 2022-07-11 NOTE — Patient Instructions (Signed)

## 2022-07-12 NOTE — Procedures (Signed)
Lumbar Facet Joint Nerve Denervation  Patient: Erica Richardson      Date of Birth: [birth year] MRN: [id number] PCP: Genia Hotter, FNP      Visit Date: 07/11/2022   Universal Protocol:    Date/Time: 05/21/245:45 AM  Consent Given By: the patient  Position: PRONE  Additional Comments: Vital signs were monitored before and after the procedure. Patient was prepped and draped in the usual sterile fashion. The correct patient, procedure, and site was verified.   Injection Procedure Details:   Procedure diagnoses:  1. Spondylosis without myelopathy or radiculopathy, lumbar region      Meds Administered:  Meds ordered this encounter  Medications   methylPREDNISolone acetate (DEPO-MEDROL) injection 80 mg   baclofen (LIORESAL) 10 MG tablet    Sig: Take 1 tablet (10 mg total) by mouth 3 (three) times daily.    Dispense:  30 each    Refill:  0     Laterality: Left  Location/Site:  L3-L4, L2 and L3 medial branches, L4-L5, L3 and L4 medial branches, and L5-S1, L4 medial branch and L5 dorsal ramus  Needle: 18 ga.,  10mm active tip, RF Cannula  Needle Placement: Along juncture of superior articular process and transverse pocess  Findings:  -Comments: Patient does not really have significant trigger points today with positioning the needle down through the multifidus to the facet joint.  At L5 position she had significant excruciating pain at the quadratus lumborum site.  She was very anxious when this pain occurred to the point where he really had a hard time getting her to calm down.  I remove the needle at that point after confirming placement which was posterior to the spine in the muscle area.  I reinserted the needle at a different angle and she had no issues with that at all.  There were no paresthesias or pain in the leg.  Procedure Details: For each desired target nerve, the corresponding transverse process (sacral ala for the L5 dorsal rami) was identified and the  fluoroscope was positioned to square off the endplates of the corresponding vertebral body to achieve a true AP midline view.  The beam was then obliqued 15 to 20 degrees and caudally tilted 15 to 20 degrees to line up a trajectory along the target nerves. The skin over the target of the junction of superior articulating process and transverse process (sacral ala for the L5 dorsal rami) was infiltrated with 1ml of 1% Lidocaine without Epinephrine.  The 18 gauge 10mm active tip outer cannula was advanced in trajectory view to the target.  This procedure was repeated for each target nerve.  Then, for all levels, the outer cannula placement was fine-tuned and the position was then confirmed with bi-planar imaging.    Test stimulation was done both at sensory and motor levels to ensure there was no radicular stimulation. The target tissues were then infiltrated with 1 ml of 1% Lidocaine without Epinephrine. Subsequently, a percutaneous neurotomy was carried out for 90 seconds at 80 degrees Celsius.  After the completion of the lesion, 1 ml of injectate was delivered. It was then repeated for each facet joint nerve mentioned above. Appropriate radiographs were obtained to verify the probe placement during the neurotomy.   Additional Comments:  No complications occurred Dressing: 2 x 2 sterile gauze and Band-Aid    Post-procedure details: Patient was observed during the procedure. Post-procedure instructions were reviewed.  Patient left the clinic in stable condition.

## 2022-07-13 ENCOUNTER — Ambulatory Visit (INDEPENDENT_AMBULATORY_CARE_PROVIDER_SITE_OTHER): Payer: Medicare Other | Admitting: Internal Medicine

## 2022-07-13 ENCOUNTER — Encounter: Payer: Self-pay | Admitting: Internal Medicine

## 2022-07-13 VITALS — BP 122/72 | HR 58 | Ht 63.0 in | Wt 148.2 lb

## 2022-07-13 DIAGNOSIS — Z122 Encounter for screening for malignant neoplasm of respiratory organs: Secondary | ICD-10-CM | POA: Diagnosis not present

## 2022-07-13 DIAGNOSIS — J439 Emphysema, unspecified: Secondary | ICD-10-CM

## 2022-07-13 DIAGNOSIS — J4489 Other specified chronic obstructive pulmonary disease: Secondary | ICD-10-CM

## 2022-07-13 DIAGNOSIS — F17210 Nicotine dependence, cigarettes, uncomplicated: Secondary | ICD-10-CM | POA: Diagnosis not present

## 2022-07-13 DIAGNOSIS — Z716 Tobacco abuse counseling: Secondary | ICD-10-CM

## 2022-07-13 NOTE — Patient Instructions (Addendum)
Please schedule follow up scheduled with myself in 1 year.  If my schedule is not open yet, we will contact you with a reminder closer to that time. Please call [phone] if you haven't heard from [name] a month before.   Before your next visit I would like you to have: CT chest in April 2025, I'll see you after this.   Continue symbicort and spiriva. Wellbutrin for quitting smoking is a great idea. Good luck!  What are the benefits of quitting smoking? Quitting smoking can lower your chances of getting or dying from heart disease, lung disease, kidney failure, infection, or cancer. It can also lower your chances of getting osteoporosis, a condition that makes your bones weak. Plus, quitting smoking can help your skin look younger and reduce the chances that you will have problems with sex.  Quitting smoking will improve your health no matter how old you are, and no matter how long or how much you have smoked.  What should I do if I want to quit smoking? The letters in the word "START" can help you remember the steps to take: S = Set a quit date. T = Tell family, friends, and the people around you that you plan to quit. A = Anticipate or plan ahead for the tough times you'll face while quitting. R = Remove cigarettes and other tobacco products from your home, car, and work. T = Talk to your doctor about getting help to quit.  How can my doctor or nurse help? Your doctor or nurse can give you advice on the best way to quit. He or she can also put you in touch with counselors or other people you can call for support. Plus, your doctor or nurse can give you medicines to: ?Reduce your craving for cigarettes ?Reduce the unpleasant symptoms that happen when you stop smoking (called "withdrawal symptoms"). You can also get help from a free phone line (1-800-QUIT-NOW) or go online to [employer].  What are the symptoms of withdrawal? The symptoms include: ?Trouble sleeping ?Being  irritable, anxious or restless ?Getting frustrated or angry ?Having trouble thinking clearly  Some people who stop smoking become temporarily depressed. Some people need treatment for depression, such as counseling or antidepressant medicines. Depressed people might: ?No longer enjoy or care about doing the things they used to like to do ?Feel sad, down, hopeless, nervous, or cranky most of the day, almost every day ?Lose or gain weight ?Sleep too much or too little ?Feel tired or like they have no energy ?Feel guilty or like they are worth nothing ?Forget things or feel confused ?Move and speak more slowly than usual ?Act restless or have trouble staying still ?Think about death or suicide  If you think you might be depressed, see your doctor or nurse. Only someone trained in mental health can tell for sure if you are depressed. If you ever feel like you might hurt yourself, go straight to the nearest emergency department. Or you can call for an ambulance (in the [name] and [place], dial 9-1-1) or call your doctor or nurse right away and tell them it is an emergency. You can also reach the [name] National Suicide Prevention Lifeline at 7138606047 or http://hill.com/.  How do medicines help you stop smoking? Different medicines work in different ways: ?Nicotine replacement therapy eases withdrawal and reduces your body's craving for nicotine, the main drug found in cigarettes. There are different forms of nicotine replacement, including skin patches, lozenges, gum, nasal sprays, and "  puffers" or inhalers. Many can be bought without a prescription, while others might require one. ?Bupropion is a prescription medicine that reduces your desire to smoke. This medicine is sold under the brand names Zyban and Wellbutrin. It is also available in a generic version, which is cheaper than brand name medicines. ?Varenicline (brand names: Chantix, Champix) is a prescription medicine that  reduces withdrawal symptoms and cigarette cravings. If you think you'd like to take varenicline and you have a history of depression, anxiety, or heart disease, discuss this with your doctor or nurse before taking the medicine. Varenicline can also increase the effects of alcohol in some people. It's a good idea to limit drinking while you're taking it, at least until you know how it affects you.  How does counseling work? Counseling can happen during formal office visits or just over the phone. A counselor can help you: ?Figure out what triggers your smoking and what to do instead ?Overcome cravings ?Figure out what went wrong when you tried to quit before  What works best? Studies show that people have the best luck at quitting if they take medicines to help them quit and work with a Veterinary surgeon. It might also be helpful to combine nicotine replacement with one of the prescription medicines that help people quit. In some cases, it might even make sense to take bupropion and varenicline together.  What about e-cigarettes? Sometimes people wonder if using electronic cigarettes, or "e-cigarettes," might help them quit smoking. Using e-cigarettes is also called "vaping." Doctors do not recommend e-cigarettes in place of medicines and counseling. That's because e-cigarettes still contain nicotine as well as other substances that might be harmful. It's not clear how they can affect a person's health in the long term.  Will I gain weight if I quit? Yes, you might gain a few pounds. But quitting smoking will have a much more positive effect on your health than weighing a few pounds more. Plus, you can help prevent some weight gain by being more active and eating less. Taking the medicine bupropion might help control weight gain.   What else can I do to improve my chances of quitting? You can: ?Start exercising. ?Stay away from smokers and places that you associate with smoking. If people close to you  smoke, ask them to quit with you. ?Keep gum, hard candy, or something to put in your mouth handy. If you get a craving for a cigarette, try one of these instead. ?Don't give up, even if you start smoking again. It takes most people a few tries before they succeed.  What if I am pregnant and I smoke? If you are pregnant, it's really important for the health of your baby that you quit. Ask your doctor what options you have, and what is safest for your baby
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
## 2022-07-22 DIAGNOSIS — J449 Chronic obstructive pulmonary disease, unspecified: Secondary | ICD-10-CM | POA: Diagnosis not present

## 2022-07-22 DIAGNOSIS — M545 Low back pain, unspecified: Secondary | ICD-10-CM | POA: Diagnosis not present

## 2022-07-22 DIAGNOSIS — F17210 Nicotine dependence, cigarettes, uncomplicated: Secondary | ICD-10-CM | POA: Diagnosis not present

## 2022-07-22 DIAGNOSIS — Z6826 Body mass index (BMI) 26.0-26.9, adult: Secondary | ICD-10-CM | POA: Diagnosis not present

## 2022-07-22 DIAGNOSIS — Z79899 Other long term (current) drug therapy: Secondary | ICD-10-CM | POA: Diagnosis not present

## 2022-07-27 ENCOUNTER — Telehealth: Payer: Self-pay | Admitting: Physical Medicine and Rehabilitation

## 2022-07-27 ENCOUNTER — Other Ambulatory Visit: Payer: Self-pay | Admitting: Physical Medicine and Rehabilitation

## 2022-07-27 DIAGNOSIS — G8929 Other chronic pain: Secondary | ICD-10-CM

## 2022-07-27 DIAGNOSIS — G894 Chronic pain syndrome: Secondary | ICD-10-CM

## 2022-07-27 DIAGNOSIS — M5442 Lumbago with sciatica, left side: Secondary | ICD-10-CM | POA: Diagnosis not present

## 2022-07-27 DIAGNOSIS — M4807 Spinal stenosis, lumbosacral region: Secondary | ICD-10-CM | POA: Diagnosis not present

## 2022-07-27 DIAGNOSIS — M47816 Spondylosis without myelopathy or radiculopathy, lumbar region: Secondary | ICD-10-CM

## 2022-07-27 NOTE — Telephone Encounter (Signed)
LVM to return call to clinic to provide information

## 2022-07-27 NOTE — Telephone Encounter (Signed)
Order flipped

## 2022-07-27 NOTE — Telephone Encounter (Signed)
Pt called requesting a call from Dr Alvester Morin. Pt states her last appt she had ablasion and it did not help and need to know what else can be done. Please call pt at [phone]

## 2022-07-27 NOTE — Telephone Encounter (Signed)
Pt called back requesting a referral be sent to [facility] Physical therapy for dry needling. Please call pt about this matter at [phone]

## 2022-07-28 NOTE — Telephone Encounter (Signed)
Spoke with patient and informed her of information below. She is doing PT in [facility] and is in pain management at [facility] in [place]. She is not sure if they need to be sent something from Dr. Alvester Morin. She does not want to have a surgical consult yet and will give [name] a call when she is ready.

## 2022-07-29 DIAGNOSIS — M545 Low back pain, unspecified: Secondary | ICD-10-CM | POA: Diagnosis not present

## 2022-07-29 DIAGNOSIS — J449 Chronic obstructive pulmonary disease, unspecified: Secondary | ICD-10-CM | POA: Diagnosis not present

## 2022-07-29 DIAGNOSIS — Z79899 Other long term (current) drug therapy: Secondary | ICD-10-CM | POA: Diagnosis not present

## 2022-07-29 DIAGNOSIS — Z6826 Body mass index (BMI) 26.0-26.9, adult: Secondary | ICD-10-CM | POA: Diagnosis not present

## 2022-08-02 DIAGNOSIS — Z79899 Other long term (current) drug therapy: Secondary | ICD-10-CM | POA: Diagnosis not present

## 2022-08-08 ENCOUNTER — Telehealth: Payer: Self-pay | Admitting: Physical Medicine and Rehabilitation

## 2022-08-08 DIAGNOSIS — M545 Low back pain, unspecified: Secondary | ICD-10-CM

## 2022-08-08 DIAGNOSIS — M5416 Radiculopathy, lumbar region: Secondary | ICD-10-CM

## 2022-08-08 DIAGNOSIS — M47816 Spondylosis without myelopathy or radiculopathy, lumbar region: Secondary | ICD-10-CM

## 2022-08-08 NOTE — Telephone Encounter (Signed)
Pt called Dr Marikay Alar office and they want a 2nd opinion and would like [name] to Fax over a referral over to their office [phone] so he can see her also.

## 2022-08-08 NOTE — Telephone Encounter (Signed)
I put in referral to [facility], not sure if it needs to be flipped

## 2022-08-09 DIAGNOSIS — M5442 Lumbago with sciatica, left side: Secondary | ICD-10-CM | POA: Diagnosis not present

## 2022-08-09 DIAGNOSIS — M4807 Spinal stenosis, lumbosacral region: Secondary | ICD-10-CM | POA: Diagnosis not present

## 2022-08-11 DIAGNOSIS — M5442 Lumbago with sciatica, left side: Secondary | ICD-10-CM | POA: Diagnosis not present

## 2022-08-11 DIAGNOSIS — M4807 Spinal stenosis, lumbosacral region: Secondary | ICD-10-CM | POA: Diagnosis not present

## 2022-08-16 DIAGNOSIS — M5418 Radiculopathy, sacral and sacrococcygeal region: Secondary | ICD-10-CM | POA: Diagnosis not present

## 2022-08-17 DIAGNOSIS — M5442 Lumbago with sciatica, left side: Secondary | ICD-10-CM | POA: Diagnosis not present

## 2022-08-17 DIAGNOSIS — M4807 Spinal stenosis, lumbosacral region: Secondary | ICD-10-CM | POA: Diagnosis not present

## 2022-09-02 DIAGNOSIS — M545 Low back pain, unspecified: Secondary | ICD-10-CM | POA: Insufficient documentation

## 2022-09-08 ENCOUNTER — Telehealth: Payer: Self-pay | Admitting: Physical Medicine and Rehabilitation

## 2022-09-08 DIAGNOSIS — M810 Age-related osteoporosis without current pathological fracture: Secondary | ICD-10-CM | POA: Diagnosis not present

## 2022-09-08 DIAGNOSIS — M545 Low back pain, unspecified: Secondary | ICD-10-CM | POA: Diagnosis not present

## 2022-09-08 DIAGNOSIS — M7989 Other specified soft tissue disorders: Secondary | ICD-10-CM | POA: Diagnosis not present

## 2022-09-08 NOTE — Telephone Encounter (Signed)
Patient called. Says her feet are swollen. Would like to know if Dr. Alvester Morin could see her before her appointment. Her call back number is [phone]

## 2022-09-09 ENCOUNTER — Other Ambulatory Visit: Payer: Self-pay | Admitting: Family Medicine

## 2022-09-09 ENCOUNTER — Ambulatory Visit (INDEPENDENT_AMBULATORY_CARE_PROVIDER_SITE_OTHER): Payer: Medicare Other

## 2022-09-09 DIAGNOSIS — M7989 Other specified soft tissue disorders: Secondary | ICD-10-CM

## 2022-09-14 ENCOUNTER — Ambulatory Visit (INDEPENDENT_AMBULATORY_CARE_PROVIDER_SITE_OTHER): Payer: Medicare Other | Admitting: Physical Medicine and Rehabilitation

## 2022-09-14 ENCOUNTER — Encounter: Payer: Self-pay | Admitting: Physical Medicine and Rehabilitation

## 2022-09-14 DIAGNOSIS — G894 Chronic pain syndrome: Secondary | ICD-10-CM | POA: Diagnosis not present

## 2022-09-14 DIAGNOSIS — M47816 Spondylosis without myelopathy or radiculopathy, lumbar region: Secondary | ICD-10-CM

## 2022-09-14 DIAGNOSIS — M545 Low back pain, unspecified: Secondary | ICD-10-CM

## 2022-09-14 DIAGNOSIS — G8929 Other chronic pain: Secondary | ICD-10-CM

## 2022-09-14 NOTE — Patient Instructions (Signed)
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
## 2022-09-14 NOTE — Progress Notes (Signed)
Functional Pain Scale - descriptive words and definitions  Distracting (5)    Aware of pain/able to complete some ADL's but limited by pain/sleep is affected and active distractions are only slightly useful. Moderate range order  Average Pain 6  Lower back pain with radiation in both legs. Ablation did not work. Pain is worse depending on sitting or standing

## 2022-09-16 ENCOUNTER — Telehealth: Payer: Self-pay | Admitting: Physical Medicine and Rehabilitation

## 2022-09-16 DIAGNOSIS — M79672 Pain in left foot: Secondary | ICD-10-CM | POA: Diagnosis not present

## 2022-09-16 DIAGNOSIS — I890 Lymphedema, not elsewhere classified: Secondary | ICD-10-CM | POA: Diagnosis not present

## 2022-09-16 DIAGNOSIS — M79671 Pain in right foot: Secondary | ICD-10-CM | POA: Diagnosis not present

## 2022-09-16 MED ORDER — BACLOFEN 10 MG PO TABS: 10.0000 mg | ORAL_TABLET | Freq: Three times a day (TID) | ORAL | 1 refills | Status: DC

## 2022-09-16 NOTE — Telephone Encounter (Signed)
Patient called needing Rx refilled Baclofen. The number to contact patient is [phone] 

## 2022-09-20 DIAGNOSIS — R609 Edema, unspecified: Secondary | ICD-10-CM | POA: Diagnosis not present

## 2022-09-20 DIAGNOSIS — R2243 Localized swelling, mass and lump, lower limb, bilateral: Secondary | ICD-10-CM | POA: Diagnosis not present

## 2022-10-09 ENCOUNTER — Other Ambulatory Visit: Payer: Self-pay | Admitting: Physical Medicine and Rehabilitation

## 2022-10-27 DIAGNOSIS — M7989 Other specified soft tissue disorders: Secondary | ICD-10-CM | POA: Diagnosis not present

## 2022-11-02 DIAGNOSIS — D225 Melanocytic nevi of trunk: Secondary | ICD-10-CM | POA: Diagnosis not present

## 2022-11-02 DIAGNOSIS — Z1283 Encounter for screening for malignant neoplasm of skin: Secondary | ICD-10-CM | POA: Diagnosis not present

## 2022-11-02 DIAGNOSIS — L820 Inflamed seborrheic keratosis: Secondary | ICD-10-CM | POA: Diagnosis not present

## 2022-11-03 DIAGNOSIS — Z23 Encounter for immunization: Secondary | ICD-10-CM | POA: Diagnosis not present

## 2022-11-09 ENCOUNTER — Ambulatory Visit (INDEPENDENT_AMBULATORY_CARE_PROVIDER_SITE_OTHER): Payer: Medicare Other | Admitting: Pulmonary Disease

## 2022-11-09 ENCOUNTER — Encounter: Payer: Self-pay | Admitting: Pulmonary Disease

## 2022-11-09 VITALS — BP 120/74 | HR 53 | Ht 64.0 in | Wt 148.0 lb

## 2022-11-09 DIAGNOSIS — N898 Other specified noninflammatory disorders of vagina: Secondary | ICD-10-CM | POA: Diagnosis not present

## 2022-11-09 DIAGNOSIS — J4489 Other specified chronic obstructive pulmonary disease: Secondary | ICD-10-CM | POA: Diagnosis not present

## 2022-11-09 DIAGNOSIS — Z01411 Encounter for gynecological examination (general) (routine) with abnormal findings: Secondary | ICD-10-CM | POA: Diagnosis not present

## 2022-11-09 DIAGNOSIS — Z716 Tobacco abuse counseling: Secondary | ICD-10-CM | POA: Diagnosis not present

## 2022-11-09 DIAGNOSIS — J441 Chronic obstructive pulmonary disease with (acute) exacerbation: Secondary | ICD-10-CM | POA: Diagnosis not present

## 2022-11-09 DIAGNOSIS — F17200 Nicotine dependence, unspecified, uncomplicated: Secondary | ICD-10-CM

## 2022-11-09 DIAGNOSIS — J449 Chronic obstructive pulmonary disease, unspecified: Secondary | ICD-10-CM | POA: Diagnosis not present

## 2022-11-09 DIAGNOSIS — J439 Emphysema, unspecified: Secondary | ICD-10-CM | POA: Diagnosis not present

## 2022-11-09 DIAGNOSIS — R102 Pelvic and perineal pain: Secondary | ICD-10-CM | POA: Diagnosis not present

## 2022-11-09 MED ORDER — DOXYCYCLINE HYCLATE 100 MG PO TABS: 100.0000 mg | ORAL_TABLET | Freq: Two times a day (BID) | ORAL | 0 refills | Status: DC

## 2022-11-09 MED ORDER — HYDROCODONE BIT-HOMATROP MBR 5-1.5 MG/5ML PO SOLN: 5.0000 mL | Freq: Four times a day (QID) | ORAL | 0 refills | Status: DC | PRN

## 2022-11-09 MED ORDER — PREDNISONE 20 MG PO TABS: 20.0000 mg | ORAL_TABLET | Freq: Every day | ORAL | 0 refills | Status: DC

## 2022-11-09 NOTE — Patient Instructions (Signed)
Appointment to see Dr. Celine Mans back in about 4 weeks  Prescription for prednisone sent to pharmacy  Prescription for antibiotic sent to pharmacy  Prescription for cough medicine sent to pharmacy  Continue working on quitting smoking  Graded activities as tolerated
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
## 2022-11-14 ENCOUNTER — Telehealth: Payer: Self-pay | Admitting: Physical Medicine and Rehabilitation

## 2022-11-14 NOTE — Telephone Encounter (Signed)
Received call from patient advised her Rx for Baclofen is not showing at either pharmacy. The Rx was [facility] to [facility] [address]. Patient said she changed the Rx from [address] to [facility] [address]. Patient said the Rx is not showing at either pharmacy. Patient asked if the Rx can be sent [facility] to the [facility] [address]?  The number to contact patient is [phone]

## 2022-11-15 ENCOUNTER — Other Ambulatory Visit: Payer: Self-pay | Admitting: Physical Medicine and Rehabilitation

## 2022-11-15 MED ORDER — BACLOFEN 10 MG PO TABS: 10.0000 mg | ORAL_TABLET | Freq: Three times a day (TID) | ORAL | 1 refills | Status: DC

## 2022-11-16 DIAGNOSIS — N200 Calculus of kidney: Secondary | ICD-10-CM | POA: Diagnosis not present

## 2022-11-16 DIAGNOSIS — G894 Chronic pain syndrome: Secondary | ICD-10-CM | POA: Diagnosis not present

## 2022-11-22 DIAGNOSIS — R102 Pelvic and perineal pain: Secondary | ICD-10-CM | POA: Diagnosis not present

## 2022-11-24 ENCOUNTER — Other Ambulatory Visit: Payer: Self-pay | Admitting: Obstetrics and Gynecology

## 2022-11-24 DIAGNOSIS — R102 Pelvic and perineal pain: Secondary | ICD-10-CM

## 2022-12-12 DIAGNOSIS — L97412 Non-pressure chronic ulcer of right heel and midfoot with fat layer exposed: Secondary | ICD-10-CM | POA: Diagnosis not present

## 2022-12-14 ENCOUNTER — Telehealth: Payer: Self-pay | Admitting: Physical Medicine and Rehabilitation

## 2022-12-14 DIAGNOSIS — Q068 Other specified congenital malformations of spinal cord: Secondary | ICD-10-CM | POA: Diagnosis not present

## 2022-12-14 NOTE — Telephone Encounter (Signed)
Patient called asked if she can get an injection in her left buttocks? The number to contact patient is [phone]

## 2022-12-15 ENCOUNTER — Other Ambulatory Visit: Payer: Self-pay | Admitting: Physical Medicine and Rehabilitation

## 2022-12-15 DIAGNOSIS — M533 Sacrococcygeal disorders, not elsewhere classified: Secondary | ICD-10-CM

## 2022-12-15 NOTE — Telephone Encounter (Signed)
Spoke with patient and she is requesting a SI joint injection. Please advise

## 2022-12-19 ENCOUNTER — Telehealth: Payer: Self-pay | Admitting: Physical Medicine and Rehabilitation

## 2022-12-19 NOTE — Telephone Encounter (Signed)
Patient called returning the call. CB#[phone]

## 2022-12-20 NOTE — Telephone Encounter (Signed)
LVM to return call to schedule injection 

## 2022-12-21 ENCOUNTER — Emergency Department (HOSPITAL_COMMUNITY): Payer: Medicare Other

## 2022-12-21 ENCOUNTER — Encounter (HOSPITAL_COMMUNITY): Payer: Self-pay

## 2022-12-21 ENCOUNTER — Ambulatory Visit: Payer: Medicare Other | Admitting: Physical Medicine and Rehabilitation

## 2022-12-21 ENCOUNTER — Inpatient Hospital Stay: Admission: RE | Admit: 2022-12-21 | Payer: Medicare Other | Source: Ambulatory Visit

## 2022-12-21 ENCOUNTER — Inpatient Hospital Stay (HOSPITAL_COMMUNITY)
Admission: EM | Admit: 2022-12-21 | Discharge: 2022-12-23 | DRG: 190 | Disposition: A | Discharge status: Home / Self Care | Payer: Medicare Other | Attending: Internal Medicine | Admitting: Internal Medicine

## 2022-12-21 ENCOUNTER — Other Ambulatory Visit: Payer: Self-pay

## 2022-12-21 DIAGNOSIS — Z8249 Family history of ischemic heart disease and other diseases of the circulatory system: Secondary | ICD-10-CM | POA: Diagnosis not present

## 2022-12-21 DIAGNOSIS — Z79899 Other long term (current) drug therapy: Secondary | ICD-10-CM

## 2022-12-21 DIAGNOSIS — Z7951 Long term (current) use of inhaled steroids: Secondary | ICD-10-CM

## 2022-12-21 DIAGNOSIS — F17210 Nicotine dependence, cigarettes, uncomplicated: Secondary | ICD-10-CM | POA: Diagnosis present

## 2022-12-21 DIAGNOSIS — J441 Chronic obstructive pulmonary disease with (acute) exacerbation: Principal | ICD-10-CM | POA: Diagnosis present

## 2022-12-21 DIAGNOSIS — Z91148 Patient's other noncompliance with medication regimen for other reason: Secondary | ICD-10-CM

## 2022-12-21 DIAGNOSIS — J449 Chronic obstructive pulmonary disease, unspecified: Secondary | ICD-10-CM | POA: Diagnosis present

## 2022-12-21 DIAGNOSIS — F32A Depression, unspecified: Secondary | ICD-10-CM | POA: Diagnosis present

## 2022-12-21 DIAGNOSIS — G2581 Restless legs syndrome: Secondary | ICD-10-CM | POA: Diagnosis present

## 2022-12-21 DIAGNOSIS — R0602 Shortness of breath: Secondary | ICD-10-CM | POA: Diagnosis not present

## 2022-12-21 DIAGNOSIS — Z716 Tobacco abuse counseling: Secondary | ICD-10-CM

## 2022-12-21 DIAGNOSIS — Z885 Allergy status to narcotic agent status: Secondary | ICD-10-CM | POA: Diagnosis not present

## 2022-12-21 DIAGNOSIS — M461 Sacroiliitis, not elsewhere classified: Secondary | ICD-10-CM

## 2022-12-21 DIAGNOSIS — F419 Anxiety disorder, unspecified: Secondary | ICD-10-CM | POA: Diagnosis not present

## 2022-12-21 DIAGNOSIS — E876 Hypokalemia: Secondary | ICD-10-CM | POA: Diagnosis present

## 2022-12-21 DIAGNOSIS — D72829 Elevated white blood cell count, unspecified: Secondary | ICD-10-CM | POA: Diagnosis not present

## 2022-12-21 DIAGNOSIS — Z888 Allergy status to other drugs, medicaments and biological substances status: Secondary | ICD-10-CM

## 2022-12-21 DIAGNOSIS — Z1152 Encounter for screening for COVID-19: Secondary | ICD-10-CM | POA: Diagnosis not present

## 2022-12-21 DIAGNOSIS — Z881 Allergy status to other antibiotic agents status: Secondary | ICD-10-CM

## 2022-12-21 DIAGNOSIS — Z7952 Long term (current) use of systemic steroids: Secondary | ICD-10-CM | POA: Diagnosis not present

## 2022-12-21 DIAGNOSIS — J9601 Acute respiratory failure with hypoxia: Secondary | ICD-10-CM | POA: Diagnosis present

## 2022-12-21 DIAGNOSIS — R062 Wheezing: Secondary | ICD-10-CM | POA: Diagnosis not present

## 2022-12-21 DIAGNOSIS — E785 Hyperlipidemia, unspecified: Secondary | ICD-10-CM | POA: Diagnosis present

## 2022-12-21 DIAGNOSIS — Z886 Allergy status to analgesic agent status: Secondary | ICD-10-CM

## 2022-12-21 DIAGNOSIS — R509 Fever, unspecified: Secondary | ICD-10-CM | POA: Diagnosis not present

## 2022-12-21 DIAGNOSIS — R110 Nausea: Secondary | ICD-10-CM | POA: Diagnosis not present

## 2022-12-21 DIAGNOSIS — R0902 Hypoxemia: Secondary | ICD-10-CM | POA: Diagnosis not present

## 2022-12-21 DIAGNOSIS — R051 Acute cough: Secondary | ICD-10-CM | POA: Diagnosis not present

## 2022-12-21 DIAGNOSIS — Z8709 Personal history of other diseases of the respiratory system: Secondary | ICD-10-CM | POA: Diagnosis not present

## 2022-12-21 LAB — CBC WITH DIFFERENTIAL/PLATELET
Abs Immature Granulocytes: 0.24 10*3/uL — ABNORMAL HIGH (ref 0.00–0.07)
Basophils Absolute: 0 10*3/uL (ref 0.0–0.1)
Basophils Relative: 0 %
Eosinophils Absolute: 0 10*3/uL (ref 0.0–0.5)
Eosinophils Relative: 0 %
HCT: 39.4 % (ref 36.0–46.0)
Hemoglobin: 12.8 g/dL (ref 12.0–15.0)
Immature Granulocytes: 1 %
Lymphocytes Relative: 3 %
Lymphs Abs: 0.7 10*3/uL (ref 0.7–4.0)
MCH: 32.1 pg (ref 26.0–34.0)
MCHC: 32.5 g/dL (ref 30.0–36.0)
MCV: 98.7 fL (ref 80.0–100.0)
Monocytes Absolute: 2.1 10*3/uL — ABNORMAL HIGH (ref 0.1–1.0)
Monocytes Relative: 10 %
Neutro Abs: 17.1 10*3/uL — ABNORMAL HIGH (ref 1.7–7.7)
Neutrophils Relative %: 86 %
Platelets: 237 10*3/uL (ref 150–400)
RBC: 3.99 MIL/uL (ref 3.87–5.11)
RDW: 12.6 % (ref 11.5–15.5)
WBC: 20.1 10*3/uL — ABNORMAL HIGH (ref 4.0–10.5)
nRBC: 0 % (ref 0.0–0.2)

## 2022-12-21 LAB — BASIC METABOLIC PANEL
Anion gap: 11 (ref 5–15)
BUN: 13 mg/dL (ref 8–23)
CO2: 27 mmol/L (ref 22–32)
Calcium: 8.7 mg/dL — ABNORMAL LOW (ref 8.9–10.3)
Chloride: 99 mmol/L (ref 98–111)
Creatinine, Ser: 0.41 mg/dL — ABNORMAL LOW (ref 0.44–1.00)
GFR, Estimated: >60 mL/min (ref >60)
Glucose, Bld: 139 mg/dL — ABNORMAL HIGH (ref 70–99)
Potassium: 3.4 mmol/L — ABNORMAL LOW (ref 3.5–5.1)
Sodium: 137 mmol/L (ref 135–145)

## 2022-12-21 LAB — RESP PANEL BY RT-PCR (RSV, FLU A&B, COVID)  RVPGX2
Influenza A by PCR: NEGATIVE
Influenza B by PCR: NEGATIVE
Resp Syncytial Virus by PCR: NEGATIVE
SARS Coronavirus 2 by RT PCR: NEGATIVE

## 2022-12-21 LAB — BRAIN NATRIURETIC PEPTIDE: B Natriuretic Peptide: 131.3 pg/mL — ABNORMAL HIGH (ref 0.0–100.0)

## 2022-12-21 MED ORDER — PROCHLORPERAZINE EDISYLATE 10 MG/2ML IJ SOLN
5.0000 mg | Freq: Four times a day (QID) | INTRAMUSCULAR | Status: DC | PRN
  Administered 2022-12-22: 5 mg via INTRAVENOUS
  Filled 2022-12-21: qty 2

## 2022-12-21 MED ORDER — MAGNESIUM OXIDE -MG SUPPLEMENT 400 (240 MG) MG PO TABS
800.0000 mg | ORAL_TABLET | Freq: Once | ORAL | Status: AC
  Administered 2022-12-21: 800 mg via ORAL
  Filled 2022-12-21: qty 2

## 2022-12-21 MED ORDER — POLYETHYLENE GLYCOL 3350 17 G PO PACK: 17.0000 g | PACK | Freq: Every day | ORAL | Status: DC | PRN

## 2022-12-21 MED ORDER — PREGABALIN 100 MG PO CAPS
100.0000 mg | ORAL_CAPSULE | Freq: Two times a day (BID) | ORAL | Status: DC
  Filled 2022-12-21: qty 2
  Filled 2022-12-21 (×2): qty 1

## 2022-12-21 MED ORDER — NICOTINE 14 MG/24HR TD PT24
14.0000 mg | MEDICATED_PATCH | Freq: Every day | TRANSDERMAL | Status: DC
  Administered 2022-12-22 – 2022-12-23 (×2): 14 mg via TRANSDERMAL
  Filled 2022-12-21 (×2): qty 1

## 2022-12-21 MED ORDER — ATORVASTATIN CALCIUM 10 MG PO TABS
10.0000 mg | ORAL_TABLET | Freq: Every evening | ORAL | Status: DC
  Administered 2022-12-21: 10 mg via ORAL
  Filled 2022-12-21 (×2): qty 1

## 2022-12-21 MED ORDER — IPRATROPIUM-ALBUTEROL 0.5-2.5 (3) MG/3ML IN SOLN
3.0000 mL | Freq: Four times a day (QID) | RESPIRATORY_TRACT | Status: DC
  Administered 2022-12-21: 3 mL via RESPIRATORY_TRACT
  Filled 2022-12-21: qty 3

## 2022-12-21 MED ORDER — METHYLPREDNISOLONE SODIUM SUCC 40 MG IJ SOLR
40.0000 mg | Freq: Every day | INTRAMUSCULAR | Status: AC
  Administered 2022-12-22 – 2022-12-23 (×2): 40 mg via INTRAVENOUS
  Filled 2022-12-21 (×2): qty 1

## 2022-12-21 MED ORDER — SODIUM CHLORIDE 0.9 % IV SOLN
2.0000 g | INTRAVENOUS | Status: DC
  Administered 2022-12-22 – 2022-12-23 (×2): 2 g via INTRAVENOUS
  Filled 2022-12-21 (×2): qty 20

## 2022-12-21 MED ORDER — IPRATROPIUM-ALBUTEROL 0.5-2.5 (3) MG/3ML IN SOLN
3.0000 mL | Freq: Three times a day (TID) | RESPIRATORY_TRACT | Status: DC
  Administered 2022-12-22: 3 mL via RESPIRATORY_TRACT
  Filled 2022-12-21: qty 3

## 2022-12-21 MED ORDER — ACETAMINOPHEN 325 MG PO TABS: 650.0000 mg | ORAL_TABLET | Freq: Four times a day (QID) | ORAL | Status: DC | PRN

## 2022-12-21 MED ORDER — CITALOPRAM HYDROBROMIDE 20 MG PO TABS
40.0000 mg | ORAL_TABLET | Freq: Every evening | ORAL | Status: DC
  Administered 2022-12-22: 40 mg via ORAL
  Filled 2022-12-21: qty 2

## 2022-12-21 MED ORDER — ENOXAPARIN SODIUM 40 MG/0.4ML IJ SOSY
40.0000 mg | PREFILLED_SYRINGE | INTRAMUSCULAR | Status: DC
  Filled 2022-12-21 (×2): qty 0.4

## 2022-12-21 MED ORDER — MOMETASONE FURO-FORMOTEROL FUM 100-5 MCG/ACT IN AERO
2.0000 | INHALATION_SPRAY | Freq: Two times a day (BID) | RESPIRATORY_TRACT | Status: DC
  Filled 2022-12-21: qty 8.8

## 2022-12-21 MED ORDER — POTASSIUM CHLORIDE CRYS ER 20 MEQ PO TBCR
40.0000 meq | EXTENDED_RELEASE_TABLET | Freq: Two times a day (BID) | ORAL | Status: DC
  Administered 2022-12-21 – 2022-12-23 (×4): 40 meq via ORAL
  Filled 2022-12-21 (×4): qty 2

## 2022-12-21 MED ORDER — DEXTROSE 5 % IV SOLN
100.0000 mg | Freq: Two times a day (BID) | INTRAVENOUS | Status: DC
  Administered 2022-12-21 – 2022-12-23 (×4): 100 mg via INTRAVENOUS
  Filled 2022-12-21 (×4): qty 100

## 2022-12-21 MED ORDER — PRAMIPEXOLE DIHYDROCHLORIDE 0.25 MG PO TABS
0.2500 mg | ORAL_TABLET | Freq: Every day | ORAL | Status: DC
  Administered 2022-12-22 – 2022-12-23 (×2): 0.25 mg via ORAL
  Filled 2022-12-21 (×2): qty 1

## 2022-12-21 MED ORDER — DIAZEPAM 5 MG PO TABS
2.5000 mg | ORAL_TABLET | Freq: Every day | ORAL | Status: DC | PRN
  Administered 2022-12-22: 5 mg via ORAL
  Filled 2022-12-21: qty 1

## 2022-12-21 MED ORDER — ALBUTEROL SULFATE (2.5 MG/3ML) 0.083% IN NEBU: 2.5000 mg | INHALATION_SOLUTION | RESPIRATORY_TRACT | Status: DC | PRN

## 2022-12-21 MED ORDER — ALBUTEROL SULFATE (2.5 MG/3ML) 0.083% IN NEBU
2.5000 mg | INHALATION_SOLUTION | Freq: Once | RESPIRATORY_TRACT | Status: DC
  Filled 2022-12-21: qty 3

## 2022-12-21 MED ORDER — MELATONIN 5 MG PO TABS: 5.0000 mg | ORAL_TABLET | Freq: Every evening | ORAL | Status: DC | PRN

## 2022-12-21 MED ORDER — METHYLPREDNISOLONE SODIUM SUCC 40 MG IJ SOLR
40.0000 mg | Freq: Once | INTRAMUSCULAR | Status: AC
  Administered 2022-12-21: 40 mg via INTRAVENOUS
  Filled 2022-12-21: qty 1
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
## 2022-12-21 NOTE — ED Triage Notes (Signed)
Pt BIBA from UC with c/o SOB, URI symptoms. Hx COPD. Wanted to get albuterol filled but UC did not fill it but called EMS. O2 at 87% RA.     10 mg albuterol 0.5 Atrovent 20G LAC  129/77-HR 75-100% on duoneb

## 2022-12-21 NOTE — Progress Notes (Unsigned)
Patient was feeling sick and left, will need to reschedule
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
## 2022-12-21 NOTE — ED Notes (Signed)
Pt o2 was at 86 after ambulating and standing without oxygen.

## 2022-12-21 NOTE — ED Notes (Signed)
Pt O2 saturation was below 85% pt went to the bathroom and did not reconnect O2. Waited approximately 4 minutes and O2 sat wold not increase over 89%. Upped pt O2 to 3 lpm.

## 2022-12-22 DIAGNOSIS — J441 Chronic obstructive pulmonary disease with (acute) exacerbation: Secondary | ICD-10-CM | POA: Diagnosis not present

## 2022-12-22 DIAGNOSIS — J9601 Acute respiratory failure with hypoxia: Secondary | ICD-10-CM | POA: Diagnosis not present

## 2022-12-22 LAB — URINALYSIS, ROUTINE W REFLEX MICROSCOPIC
Bacteria, UA: NONE SEEN
Bilirubin Urine: NEGATIVE
Glucose, UA: >=500 mg/dL — AB
Ketones, ur: 5 mg/dL — AB
Leukocytes,Ua: NEGATIVE
Nitrite: NEGATIVE
Protein, ur: NEGATIVE mg/dL
Specific Gravity, Urine: 1.007 (ref 1.005–1.030)
pH: 6 (ref 5.0–8.0)

## 2022-12-22 LAB — BASIC METABOLIC PANEL
Anion gap: 10 (ref 5–15)
BUN: 14 mg/dL (ref 8–23)
CO2: 26 mmol/L (ref 22–32)
Calcium: 9 mg/dL (ref 8.9–10.3)
Chloride: 99 mmol/L (ref 98–111)
Creatinine, Ser: 0.48 mg/dL (ref 0.44–1.00)
GFR, Estimated: >60 mL/min (ref >60)
Glucose, Bld: 183 mg/dL — ABNORMAL HIGH (ref 70–99)
Potassium: 4 mmol/L (ref 3.5–5.1)
Sodium: 135 mmol/L (ref 135–145)

## 2022-12-22 LAB — PROCALCITONIN: Procalcitonin: <0.1 ng/mL

## 2022-12-22 LAB — CBC
HCT: 40.5 % (ref 36.0–46.0)
Hemoglobin: 13 g/dL (ref 12.0–15.0)
MCH: 31.7 pg (ref 26.0–34.0)
MCHC: 32.1 g/dL (ref 30.0–36.0)
MCV: 98.8 fL (ref 80.0–100.0)
Platelets: 207 10*3/uL (ref 150–400)
RBC: 4.1 MIL/uL (ref 3.87–5.11)
RDW: 12.5 % (ref 11.5–15.5)
WBC: 16.9 10*3/uL — ABNORMAL HIGH (ref 4.0–10.5)
nRBC: 0 % (ref 0.0–0.2)

## 2022-12-22 LAB — HIV ANTIBODY (ROUTINE TESTING W REFLEX): HIV Screen 4th Generation wRfx: NONREACTIVE

## 2022-12-22 LAB — PHOSPHORUS: Phosphorus: 1.7 mg/dL — ABNORMAL LOW (ref 2.5–4.6)

## 2022-12-22 MED ORDER — SODIUM PHOSPHATES 45 MMOLE/15ML IV SOLN
30.0000 mmol | Freq: Once | INTRAVENOUS | Status: AC
  Administered 2022-12-22: 30 mmol via INTRAVENOUS
  Filled 2022-12-22: qty 10

## 2022-12-22 MED ORDER — IPRATROPIUM-ALBUTEROL 0.5-2.5 (3) MG/3ML IN SOLN
3.0000 mL | Freq: Two times a day (BID) | RESPIRATORY_TRACT | Status: DC
  Administered 2022-12-23: 3 mL via RESPIRATORY_TRACT
  Filled 2022-12-22 (×2): qty 3

## 2022-12-22 NOTE — Progress Notes (Signed)
[facility]                                                                               Erica Richardson, is a 72 y.o. female, DOB - [birth year], YQM:[id number] Admit date - 12/21/2022    Outpatient Primary MD for the patient is Stamey, Verda Cumins, FNP  LOS - 1  days    Brief summary   Erica Richardson is a 72 y.o. female with medical history significant for COPD, current tobacco user, hyperlipidemia, chronic anxiety/depression, restless leg syndrome, who presented to the ED due to progressive shortness of breath.  Associated with a productive cough with greenish mucus.  Endorses she ran out of her bronchodilators 2 days ago.   Assessment & Plan    Assessment and Plan:  Acute COPD exacerbation Continue with IV Solu-Medrol, DuoNebs and IV doxycycline. Check ambulating oxygen levels prior to discharge  Leukocytosis with concern for developing early pneumonia Patient's WBC count 20,000 on admission slowly improving Continue with Rocephin and doxycycline. Sputum cultures ordered   Acute hypoxic respiratory failure secondary to COPD exacerbation currently requiring up to 2 L of nasal cannula oxygen Patient requesting to see if she needs oxygen prior to discharge Wean oxygen as tolerated.   QTc prolongation Repeat twelve-lead EKG in the morning. Keep magnesium greater than 2 and potassium greater than 4.   Hypokalemia Replaced   Chronic anxiety/depression Resume home medications.  Tobacco use disorder Counseling provided at bedside   Hyperlipidemia Continue with statin    Estimated body mass index is 24.89 kg/m as calculated from the following:   Height as of this encounter: 5\' 4"  (1.626 m).   Weight as of this encounter: 65.8 kg.  Code Status: full code.  DVT Prophylaxis:  enoxaparin (LOVENOX) injection 40 mg Start: 12/21/22 2200   Level of Care: Level of care: Telemetry Family Communication: none at bedside.   Disposition Plan:     Remains  inpatient appropriate:  IV steroids.   Procedures:  None.   Consultants:   None.   Antimicrobials:   Anti-infectives (From admission, onward)    Start     Dose/Rate Route Frequency Ordered Stop   12/21/22 2300  cefTRIAXone (ROCEPHIN) 2 g in sodium chloride 0.9 % 100 mL IVPB        2 g 200 mL/hr over 30 Minutes Intravenous Every 24 hours 12/21/22 2249     12/21/22 2215  doxycycline (VIBRAMYCIN) 100 mg in dextrose 5 % 250 mL IVPB        100 mg 125 mL/hr over 120 Minutes Intravenous Every 12 hours 12/21/22 2200          Medications  Scheduled Meds:  albuterol  2.5 mg Nebulization Once   atorvastatin  10 mg Oral QPM   citalopram  40 mg Oral QPM   enoxaparin (LOVENOX) injection  40 mg Subcutaneous Q24H   ipratropium-albuterol  3 mL Nebulization BID   methylPREDNISolone (SOLU-MEDROL) injection  40 mg Intravenous Daily   mometasone-formoterol  2 puff Inhalation BID   nicotine  14 mg Transdermal Daily   potassium chloride  40 mEq Oral BID   pramipexole  0.25 mg Oral Daily  pregabalin  100 mg Oral BID   Continuous Infusions:  cefTRIAXone (ROCEPHIN)  IV Stopped (12/22/22 5409)   doxycycline (VIBRAMYCIN) IV 100 mg (12/22/22 1039)   sodium phosphate 30 mmol in dextrose 5 % 250 mL infusion     PRN Meds:.acetaminophen, albuterol, diazepam, melatonin, polyethylene glycol, prochlorperazine    Subjective:   Erica Richardson was seen and examined today.  Feeling better, still sob.   Objective:   Vitals:   12/22/22 0836 12/22/22 0957 12/22/22 1007 12/22/22 1045  BP:  124/63  (!) 119/58  Pulse:  71  63  Resp:  18  19  Temp:   98 F (36.7 C)   TempSrc:   Oral   SpO2: 93% 92%  96%  Weight:      Height:        Intake/Output Summary (Last 24 hours) at 12/22/2022 1211 Last data filed at 12/22/2022 8119 Gross per 24 hour  Intake 350 ml  Output --  Net 350 ml   Filed Weights   12/21/22 1931  Weight: 65.8 kg     Exam General: Alert and oriented x 3,  NAD Cardiovascular: S1 S2 auscultated, no murmurs, RRR Respiratory: air entry fair, scattered rhonchi. Wheezing posteriorly. Gastrointestinal: Soft, nontender, nondistended, + bowel sounds Ext: no pedal edema bilaterally Neuro: AAOx3, Cr N's II- XII. Strength 5/5 upper and lower extremities bilaterally Skin: No rashes Psych: Normal affect and demeanor, alert and oriented x3    Data Reviewed:  I have personally reviewed following labs and imaging studies   CBC Lab Results  Component Value Date   WBC 16.9 (H) 12/22/2022   RBC 4.10 12/22/2022   HGB 13.0 12/22/2022   HCT 40.5 12/22/2022   MCV 98.8 12/22/2022   MCH 31.7 12/22/2022   PLT 207 12/22/2022   MCHC 32.1 12/22/2022   RDW 12.5 12/22/2022   LYMPHSABS 0.7 12/21/2022   MONOABS 2.1 (H) 12/21/2022   EOSABS 0.0 12/21/2022   BASOSABS 0.0 12/21/2022     Last metabolic panel Lab Results  Component Value Date   NA 135 12/22/2022   K 4.0 12/22/2022   CL 99 12/22/2022   CO2 26 12/22/2022   BUN 14 12/22/2022   CREATININE 0.48 12/22/2022   GLUCOSE 183 (H) 12/22/2022   GFRNONAA >60 12/22/2022   GFRAA >60 02/14/2016   CALCIUM 9.0 12/22/2022   PHOS 1.7 (L) 12/22/2022   PROT 7.0 05/30/2021   ALBUMIN 3.8 05/30/2021   BILITOT 0.5 05/30/2021   ALKPHOS 45 05/30/2021   AST 20 05/30/2021   ALT 30 05/30/2021   ANIONGAP 10 12/22/2022    CBG (last 3)  No results for input(s): "GLUCAP" in the last 72 hours.    Coagulation Profile: No results for input(s): "INR", "PROTIME" in the last 168 hours.   Radiology Studies: DG Chest Port 1 View  Result Date: 12/21/2022 CLINICAL DATA:  Short of breath, COPD EXAM: PORTABLE CHEST 1 VIEW COMPARISON:  05/30/2021 FINDINGS: Single frontal view of the chest demonstrates an unremarkable cardiac silhouette. No acute airspace disease, effusion, or pneumothorax. No acute bony abnormalities. IMPRESSION: 1. No acute intrathoracic process. Electronically Signed   By: Sharlet Salina M.D.   On:  12/21/2022 19:31       Kathlen Mody M.D. [facility] 12/22/2022, 12:11 PM  Available via Epic secure chat 7am-7pm After 7 pm, please refer to night coverage provider listed on amion.

## 2022-12-22 NOTE — ED Notes (Signed)
[facility] called. 

## 2022-12-22 NOTE — Care Management Obs Status (Cosign Needed)
MEDICARE OBSERVATION STATUS NOTIFICATION   Patient Details  Name: Erica Richardson MRN: [id number] Date of Birth: Sep 04, 1950   Medicare Observation Status Notification Given:  Yes    Janae Bridgeman, RN 12/22/2022, 3:14 PM

## 2022-12-22 NOTE — TOC Initial Note (Signed)
Transition of Care [facility]) - Initial/Assessment Note    Patient Details  Name: Erica Richardson MRN: [id number] Date of Birth: 1950-03-30  Transition of [facility]) CM/SW Contact:    Janae Bridgeman, RN Phone Number: 12/22/2022, 3:46 PM  Clinical Narrative:                 Cm met with the patient at the bedside to discuss TOC needs.  The patient lives at home with her spouse and admitted for COPD exacerbation.  Patient is currently on oxygen in the hospital but not at baseline.  Patient is a current smoker.  Only DME at the home is a nebulizer.  Patient was provided resources for smoking cessation since patient and husband are both interested in quitting together.  CM will continue to follow the patient for TOC needs - as patient progresses.  Patient provided with [facility] letter.  Expected Discharge Plan: Home w Home Health Services Barriers to Discharge: Continued Medical Work up   Patient Goals and CMS Choice Patient states their goals for this hospitalization and ongoing recovery are:: To return home CMS Medicare.gov Compare Post Acute Care list provided to:: Patient Choice offered to / list presented to : Patient [place] ownership interest in [facility].provided to:: Patient    Expected Discharge Plan and Services   Discharge Planning Services: CM Consult   Living arrangements for the past 2 months: Single Family Home                                      Prior Living Arrangements/Services Living arrangements for the past 2 months: Single Family Home Lives with:: Spouse Patient language and need for interpreter reviewed:: Yes Do you feel safe going back to the place where you live?: Yes      Need for Family Participation in Patient Care: Yes (Comment) Care giver support system in place?: Yes (comment) Current home services: DME (nebulizer at home) Criminal Activity/Legal Involvement Pertinent to Current Situation/Hospitalization: No - Comment  as needed  Activities of Daily Living   ADL Screening (condition at time of admission) Independently performs ADLs?: Yes (appropriate for developmental age) Is the patient deaf or have difficulty hearing?: No Does the patient have difficulty seeing, even when wearing glasses/contacts?: Yes Does the patient have difficulty concentrating, remembering, or making decisions?: No  Permission Sought/Granted Permission sought to share information with : Case Manager, Magazine features editor                Emotional Assessment Appearance:: Appears stated age Attitude/Demeanor/Rapport: Gracious Affect (typically observed): Accepting Orientation: : Oriented to Self, Oriented to Place, Oriented to  Time, Oriented to Situation Alcohol / Substance Use: Tobacco Use Psych Involvement: No (comment)  Admission diagnosis:  COPD exacerbation (HCC) [J44.1] Acute respiratory failure with hypoxia (HCC) [J96.01] COPD with acute exacerbation (HCC) [J44.1] Patient Active Problem List   Diagnosis Date Noted   COPD with acute exacerbation (HCC) 12/21/2022   Tobacco abuse 04/11/2022   Status post arthroscopy of right shoulder 12/19/2019   Complete tear of right rotator cuff 12/12/2019   History of total hip arthroplasty, right 05/22/2017   Chronic pain of right knee 08/31/2016   Bilateral swelling of feet and ankles 03/29/2016   Acute respiratory failure with hypoxia (HCC)    COPD, severe (HCC) 02/14/2016   Status post total replacement of right hip 02/05/2016   Unilateral primary osteoarthritis,  right hip 01/29/2016   Hyperlipidemia 02/01/2010   Depression 02/01/2010   Arthropathy 02/01/2010   ABDOMINAL BLOATING 02/01/2010   Abdominal pain 02/01/2010   Anxiety state 01/29/2010   MENOPAUSAL SYNDROME 01/29/2010   History of colonic polyps 01/29/2010   PCP:  Genia Hotter, FNP Pharmacy:   [id number] - [place], [place] - [address] [place]  [address] Phone: [phone] Fax: [phone]  [id number] - [place], [place] - [address] [place] [address] Phone: [phone] Fax: [phone]     Social Determinants of Health (SDOH) Social History: SDOH Screenings   Food Insecurity: No Food Insecurity (12/22/2022)  Housing: Low Risk  (12/22/2022)  Transportation Needs: No Transportation Needs (12/22/2022)  Utilities: Not At Risk (12/22/2022)  Financial Resource Strain: Low Risk  (11/06/2022)   Received from [facility]  Tobacco Use: High Risk (12/21/2022)   SDOH Interventions:     Readmission Risk Interventions    12/22/2022    3:45 PM  Readmission Risk Prevention Plan  Post Dischage Appt Complete  Medication Screening Complete  Transportation Screening Complete

## 2022-12-22 NOTE — Progress Notes (Signed)
MC RT aware PT has been transferred to 2W05.
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
## 2022-12-23 DIAGNOSIS — J441 Chronic obstructive pulmonary disease with (acute) exacerbation: Secondary | ICD-10-CM | POA: Diagnosis not present

## 2022-12-23 LAB — CBC WITH DIFFERENTIAL/PLATELET
Abs Immature Granulocytes: 0.06 10*3/uL (ref 0.00–0.07)
Basophils Absolute: 0 10*3/uL (ref 0.0–0.1)
Basophils Relative: 0 %
Eosinophils Absolute: 0 10*3/uL (ref 0.0–0.5)
Eosinophils Relative: 0 %
HCT: 39.2 % (ref 36.0–46.0)
Hemoglobin: 12.6 g/dL (ref 12.0–15.0)
Immature Granulocytes: 1 %
Lymphocytes Relative: 15 %
Lymphs Abs: 1.8 10*3/uL (ref 0.7–4.0)
MCH: 30.9 pg (ref 26.0–34.0)
MCHC: 32.1 g/dL (ref 30.0–36.0)
MCV: 96.1 fL (ref 80.0–100.0)
Monocytes Absolute: 1.4 10*3/uL — ABNORMAL HIGH (ref 0.1–1.0)
Monocytes Relative: 11 %
Neutro Abs: 8.9 10*3/uL — ABNORMAL HIGH (ref 1.7–7.7)
Neutrophils Relative %: 73 %
Platelets: 252 10*3/uL (ref 150–400)
RBC: 4.08 MIL/uL (ref 3.87–5.11)
RDW: 12.8 % (ref 11.5–15.5)
WBC: 12.2 10*3/uL — ABNORMAL HIGH (ref 4.0–10.5)
nRBC: 0 % (ref 0.0–0.2)

## 2022-12-23 LAB — BASIC METABOLIC PANEL
Anion gap: 6 (ref 5–15)
BUN: 12 mg/dL (ref 8–23)
CO2: 26 mmol/L (ref 22–32)
Calcium: 8.4 mg/dL — ABNORMAL LOW (ref 8.9–10.3)
Chloride: 105 mmol/L (ref 98–111)
Creatinine, Ser: 0.47 mg/dL (ref 0.44–1.00)
GFR, Estimated: >60 mL/min (ref >60)
Glucose, Bld: 110 mg/dL — ABNORMAL HIGH (ref 70–99)
Potassium: 4.7 mmol/L (ref 3.5–5.1)
Sodium: 137 mmol/L (ref 135–145)

## 2022-12-23 MED ORDER — PREDNISONE 20 MG PO TABS: 40.0000 mg | ORAL_TABLET | Freq: Every day | ORAL | Status: DC

## 2022-12-23 MED ORDER — NICOTINE 14 MG/24HR TD PT24: 14.0000 mg | MEDICATED_PATCH | Freq: Every day | TRANSDERMAL | 0 refills | Status: AC

## 2022-12-23 MED ORDER — DOXYCYCLINE HYCLATE 100 MG PO TABS
100.0000 mg | ORAL_TABLET | Freq: Two times a day (BID) | ORAL | Status: DC
  Administered 2022-12-23: 100 mg via ORAL
  Filled 2022-12-23: qty 1

## 2022-12-23 MED ORDER — ALBUTEROL SULFATE (2.5 MG/3ML) 0.083% IN NEBU: 2.5000 mg | INHALATION_SOLUTION | RESPIRATORY_TRACT | 1 refills | Status: DC | PRN

## 2022-12-23 MED ORDER — PREDNISONE 20 MG PO TABS: 40.0000 mg | ORAL_TABLET | Freq: Every day | ORAL | 0 refills | Status: DC

## 2022-12-23 MED ORDER — DOXYCYCLINE HYCLATE 100 MG PO TABS: 100.0000 mg | ORAL_TABLET | Freq: Two times a day (BID) | ORAL | 0 refills | Status: DC

## 2022-12-23 NOTE — Evaluation (Signed)
Occupational Therapy Evaluation and Discharge Patient Details Name: Erica Richardson MRN: [id number] DOB: 1950/09/06 Today's Date: 12/23/2022   History of Present Illness Erica Richardson is a 72 y.o. female presented to the ED due to progressive shortness of breath.  Associated with a productive cough with greenish mucus.  Endorses she ran out of her bronchodilators 2 days ago. Found to be having a COPD exacerbation.  PHMx: COPD, current tobacco user, hyperlipidemia, chronic anxiety/depression, restless leg syndrome.   Clinical Impression   This 72 yo female admitted with above presents to acute OT with PLOF of being totally independent with basic ADLs, IADLs, and driving. She currently is very close back to this PLOF but does feel her balance is a bit off (Pt made aware through secure chat). No further OT needs, we will sign off.       If plan is discharge home, recommend the following:  (none)    Functional Status Assessment  Patient has had a recent decline in their functional status and demonstrates the ability to make significant improvements in function in a reasonable and predictable amount of time. (with all education from OT standpoint completed.)  Equipment Recommendations  None recommended by OT       Precautions / Restrictions Precautions Precautions: Fall Precaution Comments: monitor O2 Restrictions Weight Bearing Restrictions: No      Mobility Bed Mobility Overal bed mobility: Independent                  Transfers Overall transfer level: Independent Equipment used: None                      Balance Overall balance assessment: Mild deficits observed, not formally tested                                         ADL either performed or assessed with clinical judgement   ADL Overall ADL's : Independent                                       General ADL Comments: Educated on energy conservation including purse  lipped breathing (with pt return demonstrating). Pt may decide to get another shower seat that fits better in her shower     Vision Patient Visual Report: No change from baseline              Pertinent Vitals/Pain Pain Assessment Pain Assessment: No/denies pain     Extremity/Trunk Assessment Upper Extremity Assessment Upper Extremity Assessment: Overall WFL for tasks assessed           Communication Communication Communication: No apparent difficulties   Cognition Arousal: Alert Behavior During Therapy: [facility] for tasks assessed/performed                                         General Comments  Sats at rest on RA 98%, post ambulation and up and down 4 steps sats 93%. Sats came back up to 99% post 5 reps of purse lipped breathing            Home Living Family/patient expects to be discharged to:: Private residence Living Arrangements: Spouse/significant other;Children Available Help at Discharge: Family;Available  24 hours/day Type of Home: House Home Access: Stairs to enter [employer] of Steps: 4 Entrance Stairs-Rails: Right Home Layout: One level     Bathroom Shower/Tub: Walk-in Pensions consultant:  (one of both)     Home Equipment: None          Prior Functioning/Environment Prior Level of Function : Independent/Modified Independent;Driving                        OT Problem List: Cardiopulmonary status limiting activity (pt given energy conservation strategies and hand out to help combat SOB)         OT Goals(Current goals can be found in the care plan section) Acute Rehab OT Goals Patient Stated Goal: for breathing to be better         AM-PAC OT "6 Clicks" Daily Activity     Outcome Measure Help from another person eating meals?: None Help from another person taking care of personal grooming?: None Help from another person toileting, which includes using toliet, bedpan, or urinal?: None Help  from another person bathing (including washing, rinsing, drying)?: None Help from another person to put on and taking off regular upper body clothing?: None Help from another person to put on and taking off regular lower body clothing?: None 6 Click Score: 24   End of Session Nurse Communication: Mobility status (sats)  Activity Tolerance: Patient tolerated treatment well Patient left:  (sitting EOB eating breakfast)  OT Visit Diagnosis: Unsteadiness on feet (R26.81)                Time: 7564-3329 OT Time Calculation (min): 19 min Charges:  OT General Charges $OT Visit: 1 Visit OT Evaluation $OT Eval Moderate Complexity: 1 Mod  Cathy L. OT Acute Rehabilitation Services Office [phone]    Evette Georges 12/23/2022, 10:28 AM

## 2022-12-23 NOTE — Progress Notes (Signed)
Mobility Specialist Progress Note:    12/23/22 1045  Mobility  Activity Ambulated with assistance in hallway  Level of Assistance Contact guard assist, steadying assist  Assistive Device None  Distance Ambulated (ft) 300 ft  Activity Response Tolerated well  Mobility Referral Yes  $Mobility charge 1 Mobility  Mobility Specialist Start Time (ACUTE ONLY) 1045  Mobility Specialist Stop Time (ACUTE ONLY) 1052  Mobility Specialist Time Calculation (min) (ACUTE ONLY) 7 min   Pt received in room, sitting EOB. Agreeable to mobility session. Tolerated well, asx throughout. Returned pt to room, all needs met.    Feliciana Rossetti Mobility Specialist Please contact via Special educational needs teacher or  Rehab office at [phone]

## 2022-12-23 NOTE — Discharge Summary (Signed)
Physician Discharge Summary  Erica Richardson UVO:[id number] DOB: [birth year] DOA: 12/21/2022  PCP: Genia Hotter, FNP  Admit date: 12/21/2022 Discharge date: 12/23/2022  Admitted From: home Discharge disposition: home   Recommendations for Outpatient Follow-Up:   Smoking cessation   Discharge Diagnosis:   Principal Problem:   COPD with acute exacerbation [facility])    Discharge Condition: Improved.  Diet recommendation: Low sodium, heart healthy.   Wound care: None.  Code status: Full.   History of Present Illness:   Erica Richardson is a 73 y.o. female with medical history significant for COPD, current tobacco user, hyperlipidemia, chronic anxiety/depression, restless leg syndrome, who presented to the ED due to progressive shortness of breath.  Associated with a productive cough with greenish mucus.  Endorses she ran out of her bronchodilators 2 days ago.  Initially presented to urgent care to get her albuterol refilled.  While she was there she was noted to be hypoxic with O2 saturation in the mid 80s on room air.  EMS was activated.  She received O2 supplementation, albuterol nebs and Atrovent nebs en route.   In the ED, requiring 2 L [place] to maintain O2 saturation above 90%.  Chest x-ray was nonacute.  Lab studies were notable for leukocytosis with WBC 20.1, neutrophil count 17.1.  Afebrile.  Due to concern for acute COPD exacerbation, EDP requested admission.  Admitted by [facility], hospitalist service.     Hospital Course by Problem:   Acute COPD exacerbation Change to PO steroids and abx > 99% on her ambulatory sats per patient   Leukocytosis with concern for developing early pneumonia -finish abx as WBCs improving     Acute hypoxic respiratory failure secondary to COPD exacerbation  -off Ow     Hypokalemia Replaced     Chronic anxiety/depression Resume home medications.   Tobacco use disorder Counseling provided at bedside      Hyperlipidemia Continue with statin    Medical Consultants:      Discharge Exam:   Vitals:   12/23/22 0737 12/23/22 0908  BP: (!) 139/59   Pulse: (!) 56   Resp: 17   Temp: 98.1 F (36.7 C)   SpO2: 96% 96%   Vitals:   12/22/22 2120 12/23/22 0440 12/23/22 0737 12/23/22 0908  BP: 137/64 (!) 124/59 (!) 139/59   Pulse: 64 (!) 40 (!) 56   Resp: 18 18 17    Temp: 97.8 F (36.6 C) 98.2 F (36.8 C) 98.1 F (36.7 C)   TempSrc: Oral Oral Oral   SpO2: 95% 97% 96% 96%  Weight:      Height:        General exam: Appears calm and comfortable.    The results of significant diagnostics from this hospitalization (including imaging, microbiology, ancillary and laboratory) are listed below for reference.     Procedures and Diagnostic Studies:   DG Chest Port 1 View  Result Date: 12/21/2022 CLINICAL DATA:  Short of breath, COPD EXAM: PORTABLE CHEST 1 VIEW COMPARISON:  05/30/2021 FINDINGS: Single frontal view of the chest demonstrates an unremarkable cardiac silhouette. No acute airspace disease, effusion, or pneumothorax. No acute bony abnormalities. IMPRESSION: 1. No acute intrathoracic process. Electronically Signed   By: Sharlet Salina M.D.   On: 12/21/2022 19:31     Labs:   Basic Metabolic Panel: Recent Labs  Lab 12/21/22 1928 12/22/22 0445 12/23/22 0544  NA 137 135 137  K 3.4* 4.0 4.7  CL 99 99 105  CO2 27  26 26  GLUCOSE 139* 183* 110*  BUN 13 14 12   CREATININE 0.41* 0.48 0.47  CALCIUM 8.7* 9.0 8.4*  PHOS  --  1.7*  --    GFR Estimated Creatinine Clearance: 61 mL/min (by C-G formula based on SCr of 0.47 mg/dL). Liver Function Tests: No results for input(s): "AST", "ALT", "ALKPHOS", "BILITOT", "PROT", "ALBUMIN" in the last 168 hours. No results for input(s): "LIPASE", "AMYLASE" in the last 168 hours. No results for input(s): "AMMONIA" in the last 168 hours. Coagulation profile No results for input(s): "INR", "PROTIME" in the last 168 hours.  CBC: Recent  Labs  Lab 12/21/22 1928 12/22/22 0445  WBC 20.1* 16.9*  NEUTROABS 17.1*  --   HGB 12.8 13.0  HCT 39.4 40.5  MCV 98.7 98.8  PLT 237 207   Cardiac Enzymes: No results for input(s): "CKTOTAL", "CKMB", "CKMBINDEX", "TROPONINI" in the last 168 hours. BNP: Invalid input(s): "POCBNP" CBG: No results for input(s): "GLUCAP" in the last 168 hours. D-Dimer No results for input(s): "DDIMER" in the last 72 hours. Hgb A1c No results for input(s): "HGBA1C" in the last 72 hours. Lipid Profile No results for input(s): "CHOL", "HDL", "LDLCALC", "TRIG", "CHOLHDL", "LDLDIRECT" in the last 72 hours. Thyroid function studies No results for input(s): "TSH", "T4TOTAL", "T3FREE", "THYROIDAB" in the last 72 hours.  Invalid input(s): "FREET3" Anemia work up No results for input(s): "VITAMINB12", "FOLATE", "FERRITIN", "TIBC", "IRON", "RETICCTPCT" in the last 72 hours. Microbiology Recent Results (from the past 240 hour(s))  Resp panel by RT-PCR (RSV, Flu A&B, Covid) Anterior Nasal Swab     Status: None   Collection Time: 12/21/22  5:58 PM   Specimen: Anterior Nasal Swab  Result Value Ref Range Status   SARS Coronavirus 2 by RT PCR NEGATIVE NEGATIVE Final    Comment: (NOTE) SARS-CoV-2 target nucleic acids are NOT DETECTED.  The SARS-CoV-2 RNA is generally detectable in upper respiratory specimens during the acute phase of infection. The lowest concentration of SARS-CoV-2 viral copies this assay can detect is 138 copies/mL. A negative result does not preclude SARS-Cov-2 infection and should not be used as the sole basis for treatment or other patient management decisions. A negative result may occur with  improper specimen collection/handling, submission of specimen other than nasopharyngeal swab, presence of viral mutation(s) within the areas targeted by this assay, and inadequate number of viral copies(<138 copies/mL). A negative result must be combined with clinical observations, patient  history, and epidemiological information. The expected result is Negative.  Fact Sheet for Patients:  BloggerCourse.com  Fact Sheet for Healthcare Providers:  SeriousBroker.it  This test is no t yet approved or cleared by the [place] FDA and  has been authorized for detection and/or diagnosis of SARS-CoV-2 by FDA under an Emergency Use Authorization (EUA). This EUA will remain  in effect (meaning this test can be used) for the duration of the COVID-19 declaration under Section 564(b)(1) of the Act, 21 U.S.C.section 360bbb-3(b)(1), unless the authorization is terminated  or revoked sooner.       Influenza A by PCR NEGATIVE NEGATIVE Final   Influenza B by PCR NEGATIVE NEGATIVE Final    Comment: (NOTE) The Xpert Xpress SARS-CoV-2/FLU/RSV plus assay is intended as an aid in the diagnosis of influenza from Nasopharyngeal swab specimens and should not be used as a sole basis for treatment. Nasal washings and aspirates are unacceptable for Xpert Xpress SARS-CoV-2/FLU/RSV testing.  Fact Sheet for Patients: BloggerCourse.com  Fact Sheet for Healthcare Providers: SeriousBroker.it  This test is not  yet approved or cleared by the [place] and has been authorized for detection and/or diagnosis of SARS-CoV-2 by FDA under an Emergency Use Authorization (EUA). This EUA will remain in effect (meaning this test can be used) for the duration of the COVID-19 declaration under Section 564(b)(1) of the Act, 21 U.S.C. section 360bbb-3(b)(1), unless the authorization is terminated or revoked.     Resp Syncytial Virus by PCR NEGATIVE NEGATIVE Final    Comment: (NOTE) Fact Sheet for Patients: BloggerCourse.com  Fact Sheet for Healthcare Providers: SeriousBroker.it  This test is not yet approved or cleared by the [place] FDA  and has been authorized for detection and/or diagnosis of SARS-CoV-2 by FDA under an Emergency Use Authorization (EUA). This EUA will remain in effect (meaning this test can be used) for the duration of the COVID-19 declaration under Section 564(b)(1) of the Act, 21 U.S.C. section 360bbb-3(b)(1), unless the authorization is terminated or revoked.  Performed at [facility], [address] [place], [place] [address]      Discharge Instructions:   Discharge Instructions     Diet - low sodium heart healthy   Complete by: As directed    Increase activity slowly   Complete by: As directed       Allergies as of 12/23/2022       Reactions   Aleve [naproxen] Other (See Comments)   Chantix [varenicline]    Nightmares   Codeine Other (See Comments)   Upset stomach   Ibuprofen Other (See Comments)   Upset stomach   Meloxicam Other (See Comments)   Methocarbamol Diarrhea   Pravastatin Sodium    Severe muscle aches   Simvastatin    Severe muscle aches   Sulfa Antibiotics    Azithromycin Rash   Erythromycin Rash   Prednisone Anxiety   Pt states she had surgery and 1 week post op was given "large doses" of IV steroids and she "was thrashing around, could not sit still, etc"        Medication List     STOP taking these medications    pregabalin 100 MG capsule Commonly known as: LYRICA   traMADol 50 MG tablet Commonly known as: ULTRAM       TAKE these medications    acetaminophen 500 MG tablet Commonly known as: TYLENOL Take 500 mg by mouth every 6 (six) hours as needed for mild pain (pain score 1-3) or moderate pain (pain score 4-6).   albuterol 108 (90 Base) MCG/ACT inhaler Commonly known as: VENTOLIN HFA Inhale 2 puffs into the lungs every 4 (four) hours as needed for wheezing or shortness of breath.   albuterol (2.5 MG/3ML) 0.083% nebulizer solution Commonly known as: PROVENTIL Take 3 mLs (2.5 mg total) by nebulization every 4 (four)  hours as needed for wheezing or shortness of breath.   atorvastatin 10 MG tablet Commonly known as: LIPITOR Take 10 mg by mouth every evening.   baclofen 10 MG tablet Commonly known as: LIORESAL Take 1 tablet (10 mg total) by mouth 3 (three) times daily.   BIOTIN PO Take 1 tablet by mouth daily.   citalopram 40 MG tablet Commonly known as: CELEXA Take 40 mg by mouth every evening.   denosumab 60 MG/ML Sosy injection Commonly known as: PROLIA Inject 60 mg into the skin every 6 (six) months.   diazepam 5 MG tablet Commonly known as: VALIUM Take 2.5-5 mg by mouth daily as needed for anxiety.   doxycycline 100 MG tablet Commonly  known as: VIBRA-TABS Take 1 tablet (100 mg total) by mouth every 12 (twelve) hours. What changed: when to take this   fluticasone 50 MCG/ACT nasal spray Commonly known as: FLONASE Place 1 spray into both nostrils daily as needed for allergies or rhinitis.   furosemide 20 MG tablet Commonly known as: LASIX Take 10-20 mg by mouth daily as needed.   guaiFENesin-dextromethorphan 100-10 MG/5ML syrup Commonly known as: ROBITUSSIN DM Take 10 mLs by mouth every 4 (four) hours as needed for cough.   HYDROcodone bit-homatropine 5-1.5 MG/5ML syrup Commonly known as: Hydromet Take 5 mLs by mouth every 6 (six) hours as needed for cough.   loperamide 2 MG tablet Commonly known as: IMODIUM A-D Take 2 mg by mouth 3 (three) times daily as needed for diarrhea or loose stools.   mupirocin ointment 2 % Commonly known as: BACTROBAN APPLY TO AFFECTED AREA TWICE A DAY   Nebulizer Misc   nicotine 14 mg/24hr patch Commonly known as: NICODERM CQ - dosed in mg/24 hours Place 1 patch (14 mg total) onto the skin daily. Start taking on: December 24, 2022   ondansetron 4 MG disintegrating tablet Commonly known as: ZOFRAN-ODT Take 4 mg by mouth every 8 (eight) hours as needed for nausea or vomiting.   pramipexole 0.125 MG tablet Commonly known as: MIRAPEX Take  0.25 mg by mouth daily.   predniSONE 20 MG tablet Commonly known as: DELTASONE Take 2 tablets (40 mg total) by mouth daily with breakfast. Start taking on: December 24, 2022 What changed: how much to take   Symbicort 80-4.5 MCG/ACT inhaler Generic drug: budesonide-formoterol Inhale 2 puffs into the lungs 2 (two) times daily.          Time coordinating discharge: 45 min  Signed:  Joseph Art DO  [facility] 12/23/2022, 10:27 AM

## 2022-12-26 ENCOUNTER — Ambulatory Visit (INDEPENDENT_AMBULATORY_CARE_PROVIDER_SITE_OTHER): Payer: Medicare Other | Admitting: Physical Medicine and Rehabilitation

## 2022-12-26 ENCOUNTER — Encounter: Payer: Self-pay | Admitting: Physical Medicine and Rehabilitation

## 2022-12-26 ENCOUNTER — Other Ambulatory Visit: Payer: Self-pay

## 2022-12-26 DIAGNOSIS — M5416 Radiculopathy, lumbar region: Secondary | ICD-10-CM

## 2022-12-26 DIAGNOSIS — M7918 Myalgia, other site: Secondary | ICD-10-CM

## 2022-12-26 DIAGNOSIS — M47816 Spondylosis without myelopathy or radiculopathy, lumbar region: Secondary | ICD-10-CM | POA: Diagnosis not present

## 2022-12-26 DIAGNOSIS — M461 Sacroiliitis, not elsewhere classified: Secondary | ICD-10-CM | POA: Diagnosis not present

## 2022-12-26 DIAGNOSIS — L97412 Non-pressure chronic ulcer of right heel and midfoot with fat layer exposed: Secondary | ICD-10-CM | POA: Diagnosis not present
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
## 2022-12-26 NOTE — Progress Notes (Signed)
Functional Pain Scale - descriptive words and definitions  Distracting (5)    Aware of pain/able to complete some ADL's but limited by pain/sleep is affected and active distractions are only slightly useful. Moderate range order  Average Pain 5  120/65 +Driver, -BT, -Dye Allergies.

## 2022-12-29 DIAGNOSIS — D72829 Elevated white blood cell count, unspecified: Secondary | ICD-10-CM | POA: Diagnosis not present

## 2022-12-29 DIAGNOSIS — E876 Hypokalemia: Secondary | ICD-10-CM | POA: Diagnosis not present

## 2022-12-29 DIAGNOSIS — J441 Chronic obstructive pulmonary disease with (acute) exacerbation: Secondary | ICD-10-CM | POA: Diagnosis not present

## 2022-12-29 DIAGNOSIS — F17210 Nicotine dependence, cigarettes, uncomplicated: Secondary | ICD-10-CM | POA: Diagnosis not present

## 2023-01-07 ENCOUNTER — Other Ambulatory Visit: Payer: Medicare Other

## 2023-01-12 ENCOUNTER — Ambulatory Visit: Payer: Medicare Other | Admitting: Internal Medicine

## 2023-01-12 ENCOUNTER — Telehealth: Payer: Self-pay | Admitting: Pharmacist

## 2023-01-12 ENCOUNTER — Encounter: Payer: Self-pay | Admitting: Internal Medicine

## 2023-01-12 VITALS — BP 138/72 | HR 67 | Temp 98.7°F | Ht 63.5 in | Wt 146.8 lb

## 2023-01-12 DIAGNOSIS — F17200 Nicotine dependence, unspecified, uncomplicated: Secondary | ICD-10-CM | POA: Diagnosis not present

## 2023-01-12 DIAGNOSIS — J449 Chronic obstructive pulmonary disease, unspecified: Secondary | ICD-10-CM

## 2023-01-12 DIAGNOSIS — F17210 Nicotine dependence, cigarettes, uncomplicated: Secondary | ICD-10-CM

## 2023-01-12 MED ORDER — ENSIFENTRINE 3 MG/2.5ML IN SUSP: 1.0000 | Freq: Two times a day (BID) | RESPIRATORY_TRACT | 11 refills | Status: DC

## 2023-01-12 NOTE — Telephone Encounter (Signed)
Received message from Dr. Celine Mans that patient will be new start to [facility]. Referral form not completed. Patient portion mailed to home - called patient to advise  Prescriber form signed by Dr. Celine Mans.  Placed in awaiting response folder pending return of pt portion  Chesley Mires, PharmD, MPH, BCPS, CPP Clinical Pharmacist (Rheumatology and Pulmonology)

## 2023-01-12 NOTE — Patient Instructions (Addendum)
It was a pleasure to see you today!  Please schedule follow up scheduled with myself in 6 months.  If my schedule is not open yet, we will contact you with a reminder closer to that time. Please call [phone] if you haven't heard from [name] a month before, and always call us sooner if issues or concerns arise. You can also send [name] a message through MyChart, but but aware that this is not to be used for urgent issues and it may take up to 5-7 days to receive a reply. Please be aware that you will likely be able to view your results before I have a chance to respond to them. Please give [name] 5 business days to respond to any non-urgent results.    Continue the symbicort and spiriva. We are adding a new nebulized medication to your regimen called ensifentrine(ohtuvayre). This is twice daily.   Continue albuterol inhaler as needed.   I will see you back after your CT scan in April 2025.   What are the benefits of quitting smoking? Quitting smoking can lower your chances of getting or dying from heart disease, lung disease, kidney failure, infection, or cancer. It can also lower your chances of getting osteoporosis, a condition that makes your bones weak. Plus, quitting smoking can help your skin look younger and reduce the chances that you will have problems with sex.  Quitting smoking will improve your health no matter how old you are, and no matter how long or how much you have smoked.  What should I do if I want to quit smoking? The letters in the word "START" can help you remember the steps to take: S = Set a quit date. T = Tell family, friends, and the people around you that you plan to quit. A = Anticipate or plan ahead for the tough times you'll face while quitting. R = Remove cigarettes and other tobacco products from your home, car, and work. T = Talk to your doctor about getting help to quit.  How can my doctor or nurse help? Your doctor or nurse can give you advice on the best way to  quit. He or she can also put you in touch with counselors or other people you can call for support. Plus, your doctor or nurse can give you medicines to: ?Reduce your craving for cigarettes ?Reduce the unpleasant symptoms that happen when you stop smoking (called "withdrawal symptoms"). You can also get help from a free phone line (1-800-QUIT-NOW) or go online to [employer].  What are the symptoms of withdrawal? The symptoms include: ?Trouble sleeping ?Being irritable, anxious or restless ?Getting frustrated or angry ?Having trouble thinking clearly  Some people who stop smoking become temporarily depressed. Some people need treatment for depression, such as counseling or antidepressant medicines. Depressed people might: ?No longer enjoy or care about doing the things they used to like to do ?Feel sad, down, hopeless, nervous, or cranky most of the day, almost every day ?Lose or gain weight ?Sleep too much or too little ?Feel tired or like they have no energy ?Feel guilty or like they are worth nothing ?Forget things or feel confused ?Move and speak more slowly than usual ?Act restless or have trouble staying still ?Think about death or suicide  If you think you might be depressed, see your doctor or nurse. Only someone trained in mental health can tell for sure if you are depressed. If you ever feel like you might hurt yourself, go straight  to the nearest emergency department. Or you can call for an ambulance (in the [name] and [place], dial 9-1-1) or call your doctor or nurse right away and tell them it is an emergency. You can also reach the [name] National Suicide Prevention Lifeline at 563-880-3501 or http://hill.com/.  How do medicines help you stop smoking? Different medicines work in different ways: ?Nicotine replacement therapy eases withdrawal and reduces your body's craving for nicotine, the main drug found in cigarettes. There are different forms of nicotine  replacement, including skin patches, lozenges, gum, nasal sprays, and "puffers" or inhalers. Many can be bought without a prescription, while others might require one. ?Bupropion is a prescription medicine that reduces your desire to smoke. This medicine is sold under the brand names Zyban and Wellbutrin. It is also available in a generic version, which is cheaper than brand name medicines. ?Varenicline (brand names: Chantix, Champix) is a prescription medicine that reduces withdrawal symptoms and cigarette cravings. If you think you'd like to take varenicline and you have a history of depression, anxiety, or heart disease, discuss this with your doctor or nurse before taking the medicine. Varenicline can also increase the effects of alcohol in some people. It's a good idea to limit drinking while you're taking it, at least until you know how it affects you.  How does counseling work? Counseling can happen during formal office visits or just over the phone. A counselor can help you: ?Figure out what triggers your smoking and what to do instead ?Overcome cravings ?Figure out what went wrong when you tried to quit before  What works best? Studies show that people have the best luck at quitting if they take medicines to help them quit and work with a Veterinary surgeon. It might also be helpful to combine nicotine replacement with one of the prescription medicines that help people quit. In some cases, it might even make sense to take bupropion and varenicline together.  What about e-cigarettes? Sometimes people wonder if using electronic cigarettes, or "e-cigarettes," might help them quit smoking. Using e-cigarettes is also called "vaping." Doctors do not recommend e-cigarettes in place of medicines and counseling. That's because e-cigarettes still contain nicotine as well as other substances that might be harmful. It's not clear how they can affect a person's health in the long term.  Will I gain weight if I  quit? Yes, you might gain a few pounds. But quitting smoking will have a much more positive effect on your health than weighing a few pounds more. Plus, you can help prevent some weight gain by being more active and eating less. Taking the medicine bupropion might help control weight gain.   What else can I do to improve my chances of quitting? You can: ?Start exercising. ?Stay away from smokers and places that you associate with smoking. If people close to you smoke, ask them to quit with you. ?Keep gum, hard candy, or something to put in your mouth handy. If you get a craving for a cigarette, try one of these instead. ?Don't give up, even if you start smoking again. It takes most people a few tries before they succeed.  What if I am pregnant and I smoke? If you are pregnant, it's really important for the health of your baby that you quit. Ask your doctor what options you have, and what is safest for your baby
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
## 2023-01-13 ENCOUNTER — Telehealth: Payer: Self-pay | Admitting: Internal Medicine

## 2023-01-13 MED ORDER — BUPIVACAINE HCL 0.5 % IJ SOLN
2.0000 mL | INTRAMUSCULAR | Status: AC | PRN
  Administered 2022-12-26: 2 mL via INTRA_ARTICULAR

## 2023-01-13 MED ORDER — METHYLPREDNISOLONE ACETATE 40 MG/ML IJ SUSP
40.0000 mg | INTRAMUSCULAR | Status: AC | PRN
  Administered 2022-12-26: 40 mg via INTRA_ARTICULAR

## 2023-01-13 NOTE — Telephone Encounter (Signed)
Patient was in office yesterday. She feels like she is having a flare up and would like to have prednisone filled.  Pharmacy: [facility]

## 2023-01-16 NOTE — Telephone Encounter (Signed)
ATC x1.  LVM to return call. 

## 2023-01-17 ENCOUNTER — Encounter: Payer: Self-pay | Admitting: Internal Medicine

## 2023-01-17 NOTE — Telephone Encounter (Signed)
Called the pt again and still no answer- left detailed msg to call back if she is still needing assistance  Closing per protocol

## 2023-01-25 ENCOUNTER — Encounter: Payer: Self-pay | Admitting: Physical Medicine and Rehabilitation

## 2023-01-27 ENCOUNTER — Other Ambulatory Visit: Payer: Self-pay | Admitting: Physical Medicine and Rehabilitation

## 2023-01-27 DIAGNOSIS — M461 Sacroiliitis, not elsewhere classified: Secondary | ICD-10-CM

## 2023-02-09 ENCOUNTER — Ambulatory Visit
Admission: RE | Admit: 2023-02-09 | Discharge: 2023-02-09 | Disposition: A | Discharge status: Home / Self Care | Payer: Medicare Other | Source: Ambulatory Visit | Attending: Obstetrics and Gynecology | Admitting: Obstetrics and Gynecology

## 2023-02-09 DIAGNOSIS — R102 Pelvic and perineal pain: Secondary | ICD-10-CM

## 2023-02-09 DIAGNOSIS — Z96642 Presence of left artificial hip joint: Secondary | ICD-10-CM | POA: Diagnosis not present

## 2023-02-09 MED ORDER — GADOPICLENOL 0.5 MMOL/ML IV SOLN
7.0000 mL | Freq: Once | INTRAVENOUS | Status: AC | PRN
  Administered 2023-02-09: 7 mL via INTRAVENOUS

## 2023-02-10 ENCOUNTER — Telehealth: Payer: Self-pay | Admitting: Internal Medicine

## 2023-02-10 DIAGNOSIS — R110 Nausea: Secondary | ICD-10-CM | POA: Diagnosis not present

## 2023-02-10 DIAGNOSIS — R5383 Other fatigue: Secondary | ICD-10-CM | POA: Diagnosis not present

## 2023-02-10 DIAGNOSIS — R52 Pain, unspecified: Secondary | ICD-10-CM | POA: Diagnosis not present

## 2023-02-10 DIAGNOSIS — Z6826 Body mass index (BMI) 26.0-26.9, adult: Secondary | ICD-10-CM | POA: Diagnosis not present

## 2023-02-10 NOTE — Telephone Encounter (Signed)
PT states she is very sick and Dr. Celine Mans told her to call us if she was not better. She told her to do so in order to keep her out of the hospital. Pls call to advise @ [phone]

## 2023-02-10 NOTE — Telephone Encounter (Signed)
I called and spoke with the pt  She states "feels awful"- fatigue and nausea She states she feels like she needs to vomit all the time, but has not been vomiting  She DENIES- SOB, wheezing, cough, fevers  I asked her to call her PCP since she is not having any respiratory symptoms or may go to UC/ED She verbalized understanding  Routing to Dr Celine Mans as Lorain Childes

## 2023-02-12 ENCOUNTER — Emergency Department (HOSPITAL_COMMUNITY)
Admission: EM | Admit: 2023-02-12 | Discharge: 2023-02-12 | Disposition: A | Discharge status: Home / Self Care | Payer: Medicare Other | Attending: Emergency Medicine | Admitting: Emergency Medicine

## 2023-02-12 ENCOUNTER — Encounter (HOSPITAL_COMMUNITY): Payer: Self-pay

## 2023-02-12 ENCOUNTER — Other Ambulatory Visit: Payer: Self-pay

## 2023-02-12 DIAGNOSIS — E876 Hypokalemia: Secondary | ICD-10-CM | POA: Diagnosis not present

## 2023-02-12 DIAGNOSIS — R5383 Other fatigue: Secondary | ICD-10-CM | POA: Diagnosis not present

## 2023-02-12 DIAGNOSIS — Z1152 Encounter for screening for COVID-19: Secondary | ICD-10-CM | POA: Insufficient documentation

## 2023-02-12 DIAGNOSIS — R1084 Generalized abdominal pain: Secondary | ICD-10-CM | POA: Diagnosis not present

## 2023-02-12 DIAGNOSIS — J449 Chronic obstructive pulmonary disease, unspecified: Secondary | ICD-10-CM | POA: Insufficient documentation

## 2023-02-12 DIAGNOSIS — Z87440 Personal history of urinary (tract) infections: Secondary | ICD-10-CM | POA: Diagnosis not present

## 2023-02-12 DIAGNOSIS — R5381 Other malaise: Secondary | ICD-10-CM | POA: Insufficient documentation

## 2023-02-12 DIAGNOSIS — R110 Nausea: Secondary | ICD-10-CM | POA: Diagnosis not present

## 2023-02-12 LAB — URINALYSIS, ROUTINE W REFLEX MICROSCOPIC
Bilirubin Urine: NEGATIVE
Glucose, UA: NEGATIVE mg/dL
Hgb urine dipstick: NEGATIVE
Ketones, ur: 20 mg/dL — AB
Leukocytes,Ua: NEGATIVE
Nitrite: NEGATIVE
Protein, ur: NEGATIVE mg/dL
Specific Gravity, Urine: 1.006 (ref 1.005–1.030)
pH: 6 (ref 5.0–8.0)

## 2023-02-12 LAB — CBC WITH DIFFERENTIAL/PLATELET
Abs Immature Granulocytes: 0.04 10*3/uL (ref 0.00–0.07)
Basophils Absolute: 0 10*3/uL (ref 0.0–0.1)
Basophils Relative: 0 %
Eosinophils Absolute: 0 10*3/uL (ref 0.0–0.5)
Eosinophils Relative: 0 %
HCT: 39.1 % (ref 36.0–46.0)
Hemoglobin: 12.2 g/dL (ref 12.0–15.0)
Immature Granulocytes: 1 %
Lymphocytes Relative: 14 %
Lymphs Abs: 1.2 10*3/uL (ref 0.7–4.0)
MCH: 31 pg (ref 26.0–34.0)
MCHC: 31.2 g/dL (ref 30.0–36.0)
MCV: 99.5 fL (ref 80.0–100.0)
Monocytes Absolute: 0.9 10*3/uL (ref 0.1–1.0)
Monocytes Relative: 11 %
Neutro Abs: 6.6 10*3/uL (ref 1.7–7.7)
Neutrophils Relative %: 74 %
Platelets: 226 10*3/uL (ref 150–400)
RBC: 3.93 MIL/uL (ref 3.87–5.11)
RDW: 13.3 % (ref 11.5–15.5)
WBC: 8.8 10*3/uL (ref 4.0–10.5)
nRBC: 0 % (ref 0.0–0.2)

## 2023-02-12 LAB — COMPREHENSIVE METABOLIC PANEL
ALT: 23 U/L (ref 0–44)
AST: 21 U/L (ref 15–41)
Albumin: 3.8 g/dL (ref 3.5–5.0)
Alkaline Phosphatase: 47 U/L (ref 38–126)
Anion gap: 11 (ref 5–15)
BUN: 11 mg/dL (ref 8–23)
CO2: 25 mmol/L (ref 22–32)
Calcium: 8.8 mg/dL — ABNORMAL LOW (ref 8.9–10.3)
Chloride: 99 mmol/L (ref 98–111)
Creatinine, Ser: 0.46 mg/dL (ref 0.44–1.00)
GFR, Estimated: >60 mL/min (ref >60)
Glucose, Bld: 90 mg/dL (ref 70–99)
Potassium: 3.3 mmol/L — ABNORMAL LOW (ref 3.5–5.1)
Sodium: 135 mmol/L (ref 135–145)
Total Bilirubin: 1 mg/dL (ref <1.2)
Total Protein: 6.5 g/dL (ref 6.5–8.1)

## 2023-02-12 LAB — RESP PANEL BY RT-PCR (RSV, FLU A&B, COVID)  RVPGX2
Influenza A by PCR: NEGATIVE
Influenza B by PCR: NEGATIVE
Resp Syncytial Virus by PCR: NEGATIVE
SARS Coronavirus 2 by RT PCR: NEGATIVE

## 2023-02-12 LAB — LIPASE, BLOOD: Lipase: 38 U/L (ref 11–51)

## 2023-02-12 MED ORDER — ACETAMINOPHEN 325 MG PO TABS
650.0000 mg | ORAL_TABLET | Freq: Once | ORAL | Status: AC
  Administered 2023-02-12: 650 mg via ORAL
  Filled 2023-02-12: qty 2

## 2023-02-12 MED ORDER — ONDANSETRON 4 MG PO TBDP: 4.0000 mg | ORAL_TABLET | Freq: Three times a day (TID) | ORAL | 0 refills | Status: AC | PRN

## 2023-02-12 MED ORDER — POTASSIUM CHLORIDE CRYS ER 20 MEQ PO TBCR
40.0000 meq | EXTENDED_RELEASE_TABLET | Freq: Once | ORAL | Status: AC
  Administered 2023-02-12: 40 meq via ORAL
  Filled 2023-02-12: qty 2
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
## 2023-02-12 NOTE — Discharge Instructions (Addendum)
If you develop fever, new or worsening pain, or any other new/concerning symptoms then return to the ER or call 911.

## 2023-02-12 NOTE — ED Triage Notes (Signed)
Pt BIB EMS from home. Pt reports with lower abdominal pain tonight and feeling unwell. Pt reports having a uti a week ago and is on abts.

## 2023-02-12 NOTE — ED Provider Notes (Signed)
Patient presents with bodyaches.  Care transferred to me waiting on respiratory panel which is negative.  Bladder scan was 39 mL.  Not consistent with retention.  Workup is unremarkable here save for mild hypokalemia.  Unclear why she is feeling poorly over these last couple days but at this point she appears stable for discharge.  She has no abdominal pain at this time.  Will discharge home with return precautions.   Pricilla Loveless, MD 02/12/23 [phone]

## 2023-02-12 NOTE — ED Notes (Signed)
Pt ambulated to the bathroom.  

## 2023-02-13 ENCOUNTER — Encounter: Payer: Medicare Other | Admitting: Physical Medicine and Rehabilitation

## 2023-02-16 NOTE — Telephone Encounter (Signed)
NFN 

## 2023-02-24 ENCOUNTER — Telehealth: Payer: Self-pay | Admitting: Pulmonary Disease

## 2023-02-24 ENCOUNTER — Inpatient Hospital Stay (HOSPITAL_COMMUNITY)
Admission: EM | Admit: 2023-02-24 | Discharge: 2023-02-26 | DRG: 177 | Disposition: A | Discharge status: Home / Self Care | Payer: Medicare Other | Attending: Internal Medicine | Admitting: Internal Medicine

## 2023-02-24 ENCOUNTER — Encounter (HOSPITAL_COMMUNITY): Payer: Self-pay

## 2023-02-24 ENCOUNTER — Other Ambulatory Visit: Payer: Self-pay

## 2023-02-24 ENCOUNTER — Emergency Department (HOSPITAL_COMMUNITY): Payer: Medicare Other

## 2023-02-24 DIAGNOSIS — Z888 Allergy status to other drugs, medicaments and biological substances status: Secondary | ICD-10-CM | POA: Diagnosis not present

## 2023-02-24 DIAGNOSIS — Z885 Allergy status to narcotic agent status: Secondary | ICD-10-CM | POA: Diagnosis not present

## 2023-02-24 DIAGNOSIS — Z7951 Long term (current) use of inhaled steroids: Secondary | ICD-10-CM | POA: Diagnosis not present

## 2023-02-24 DIAGNOSIS — Z87442 Personal history of urinary calculi: Secondary | ICD-10-CM | POA: Diagnosis not present

## 2023-02-24 DIAGNOSIS — Z8249 Family history of ischemic heart disease and other diseases of the circulatory system: Secondary | ICD-10-CM

## 2023-02-24 DIAGNOSIS — Z79899 Other long term (current) drug therapy: Secondary | ICD-10-CM

## 2023-02-24 DIAGNOSIS — M810 Age-related osteoporosis without current pathological fracture: Secondary | ICD-10-CM | POA: Diagnosis present

## 2023-02-24 DIAGNOSIS — M1990 Unspecified osteoarthritis, unspecified site: Secondary | ICD-10-CM | POA: Diagnosis present

## 2023-02-24 DIAGNOSIS — J9601 Acute respiratory failure with hypoxia: Secondary | ICD-10-CM | POA: Diagnosis present

## 2023-02-24 DIAGNOSIS — F32A Depression, unspecified: Secondary | ICD-10-CM | POA: Diagnosis present

## 2023-02-24 DIAGNOSIS — J449 Chronic obstructive pulmonary disease, unspecified: Secondary | ICD-10-CM | POA: Diagnosis present

## 2023-02-24 DIAGNOSIS — Z96641 Presence of right artificial hip joint: Secondary | ICD-10-CM | POA: Diagnosis present

## 2023-02-24 DIAGNOSIS — R0602 Shortness of breath: Secondary | ICD-10-CM | POA: Diagnosis not present

## 2023-02-24 DIAGNOSIS — J441 Chronic obstructive pulmonary disease with (acute) exacerbation: Secondary | ICD-10-CM | POA: Diagnosis not present

## 2023-02-24 DIAGNOSIS — E785 Hyperlipidemia, unspecified: Secondary | ICD-10-CM | POA: Diagnosis present

## 2023-02-24 DIAGNOSIS — Z881 Allergy status to other antibiotic agents status: Secondary | ICD-10-CM

## 2023-02-24 DIAGNOSIS — F17210 Nicotine dependence, cigarettes, uncomplicated: Secondary | ICD-10-CM | POA: Diagnosis present

## 2023-02-24 DIAGNOSIS — U071 COVID-19: Principal | ICD-10-CM | POA: Diagnosis present

## 2023-02-24 DIAGNOSIS — Z880 Allergy status to penicillin: Secondary | ICD-10-CM

## 2023-02-24 DIAGNOSIS — Z833 Family history of diabetes mellitus: Secondary | ICD-10-CM | POA: Diagnosis not present

## 2023-02-24 DIAGNOSIS — Z716 Tobacco abuse counseling: Secondary | ICD-10-CM | POA: Diagnosis not present

## 2023-02-24 DIAGNOSIS — F419 Anxiety disorder, unspecified: Secondary | ICD-10-CM | POA: Diagnosis present

## 2023-02-24 DIAGNOSIS — R0902 Hypoxemia: Secondary | ICD-10-CM | POA: Diagnosis not present

## 2023-02-24 DIAGNOSIS — E871 Hypo-osmolality and hyponatremia: Secondary | ICD-10-CM | POA: Diagnosis present

## 2023-02-24 DIAGNOSIS — G2581 Restless legs syndrome: Secondary | ICD-10-CM | POA: Diagnosis present

## 2023-02-24 LAB — BASIC METABOLIC PANEL
Anion gap: 7 (ref 5–15)
BUN: 8 mg/dL (ref 8–23)
CO2: 28 mmol/L (ref 22–32)
Calcium: 8.5 mg/dL — ABNORMAL LOW (ref 8.9–10.3)
Chloride: 99 mmol/L (ref 98–111)
Creatinine, Ser: <0.3 mg/dL — ABNORMAL LOW (ref 0.44–1.00)
Glucose, Bld: 88 mg/dL (ref 70–99)
Potassium: 3.5 mmol/L (ref 3.5–5.1)
Sodium: 134 mmol/L — ABNORMAL LOW (ref 135–145)

## 2023-02-24 LAB — HEPATIC FUNCTION PANEL
ALT: 21 U/L (ref 0–44)
AST: 20 U/L (ref 15–41)
Albumin: 4.1 g/dL (ref 3.5–5.0)
Alkaline Phosphatase: 45 U/L (ref 38–126)
Bilirubin, Direct: <0.1 mg/dL (ref 0.0–0.2)
Total Bilirubin: 0.5 mg/dL (ref 0.0–1.2)
Total Protein: 6.8 g/dL (ref 6.5–8.1)

## 2023-02-24 LAB — CBC
HCT: 38.8 % (ref 36.0–46.0)
Hemoglobin: 12.6 g/dL (ref 12.0–15.0)
MCH: 32.4 pg (ref 26.0–34.0)
MCHC: 32.5 g/dL (ref 30.0–36.0)
MCV: 99.7 fL (ref 80.0–100.0)
Platelets: 222 10*3/uL (ref 150–400)
RBC: 3.89 MIL/uL (ref 3.87–5.11)
RDW: 14.1 % (ref 11.5–15.5)
WBC: 6.2 10*3/uL (ref 4.0–10.5)
nRBC: 0 % (ref 0.0–0.2)

## 2023-02-24 LAB — BRAIN NATRIURETIC PEPTIDE: B Natriuretic Peptide: 90.1 pg/mL (ref 0.0–100.0)

## 2023-02-24 LAB — TROPONIN I (HIGH SENSITIVITY): Troponin I (High Sensitivity): 5 ng/L (ref <18)

## 2023-02-24 LAB — RESP PANEL BY RT-PCR (RSV, FLU A&B, COVID)  RVPGX2
Influenza A by PCR: NEGATIVE
Influenza B by PCR: NEGATIVE
Resp Syncytial Virus by PCR: NEGATIVE
SARS Coronavirus 2 by RT PCR: POSITIVE — AB

## 2023-02-24 MED ORDER — DEXAMETHASONE SODIUM PHOSPHATE 10 MG/ML IJ SOLN
6.0000 mg | Freq: Once | INTRAMUSCULAR | Status: AC
Start: 2023-02-24 — End: 2023-02-24
  Administered 2023-02-24: 6 mg via INTRAVENOUS
  Filled 2023-02-24: qty 1

## 2023-02-24 MED ORDER — ACETAMINOPHEN 500 MG PO TABS
1000.0000 mg | ORAL_TABLET | Freq: Once | ORAL | Status: AC
  Administered 2023-02-24: 1000 mg via ORAL
  Filled 2023-02-24: qty 2

## 2023-02-24 MED ORDER — IPRATROPIUM-ALBUTEROL 0.5-2.5 (3) MG/3ML IN SOLN
6.0000 mL | Freq: Once | RESPIRATORY_TRACT | Status: AC
  Administered 2023-02-24: 6 mL via RESPIRATORY_TRACT
  Filled 2023-02-24: qty 3

## 2023-02-24 NOTE — ED Provider Notes (Signed)
 [place] EMERGENCY DEPARTMENT AT [facility] Provider Note   CSN: [id number] Arrival date & time: 02/24/23  1623     History Chief Complaint  Patient presents with   Shortness of Breath    HPI Erica Richardson is a 73 y.o. female presenting for chief complaint of shortness of breath.  States that everyone in the house has been sick and over the past 72 hours she has got progressively more ill.  States she checked her oxygen  at home and was in the low 80s comes in for further care and management.  Hypoxic in triage started on 2 L nasal cannula gradually improving by the time of my initial evaluation.  She endorses fever malaise fatigue cough shortness of breath dyspnea on exertion.  She denies any history of similar. Chart review reveals a history of COPD.  Patient's recorded medical, surgical, social, medication list and allergies were reviewed in the Snapshot window as part of the initial history.   Review of Systems   Review of Systems  Constitutional:  Positive for fatigue. Negative for chills and fever.  HENT:  Negative for ear pain and sore throat.   Eyes:  Negative for pain and visual disturbance.  Respiratory:  Positive for shortness of breath and wheezing. Negative for cough.   Cardiovascular:  Negative for chest pain and palpitations.  Gastrointestinal:  Negative for abdominal pain and vomiting.  Genitourinary:  Negative for dysuria and hematuria.  Musculoskeletal:  Negative for arthralgias and back pain.  Skin:  Negative for color change and rash.  Neurological:  Negative for seizures and syncope.  All other systems reviewed and are negative.   Physical Exam Updated Vital Signs BP 126/61 (BP Location: Left Arm)   Pulse 62   Temp 98.4 F (36.9 C) (Oral)   Resp 18   Ht 5' 3.5 (1.613 m)   Wt 63 kg   SpO2 95%   BMI 24.24 kg/m  Physical Exam Vitals and nursing note reviewed.  Constitutional:      General: She is not in acute distress.    Appearance:  She is well-developed.  HENT:     Head: Normocephalic and atraumatic.  Eyes:     Conjunctiva/sclera: Conjunctivae normal.  Cardiovascular:     Rate and Rhythm: Normal rate and regular rhythm.     Heart sounds: No murmur heard. Pulmonary:     Effort: Pulmonary effort is normal. No respiratory distress.     Breath sounds: Normal breath sounds.  Abdominal:     Palpations: Abdomen is soft.     Tenderness: There is no abdominal tenderness.  Musculoskeletal:        General: No swelling.     Cervical back: Neck supple.  Skin:    General: Skin is warm and dry.     Capillary Refill: Capillary refill takes less than 2 seconds.  Neurological:     Mental Status: She is alert.  Psychiatric:        Mood and Affect: Mood normal.      ED Course/ Medical Decision Making/ A&P    Procedures Procedures   Medications Ordered in ED Medications  acetaminophen  (TYLENOL ) tablet 1,000 mg (1,000 mg Oral Given 02/24/23 1833)  dexamethasone  (DECADRON ) injection 6 mg (6 mg Intravenous Given 02/24/23 1953)  ipratropium-albuterol  (DUONEB) 0.5-2.5 (3) MG/3ML nebulizer solution 6 mL (6 mLs Nebulization Given 02/24/23 2319)    Medical Decision Making:   Medical Decision Making:   [name] is a 73 y.o. female  with a history of COPD, who presented to the ED today with acute on chronic SOB. They are endorsing worsening of their baseline dyspnea over the past 72 hours.   On my initial exam, the pt was SOB and tachypneic. Audible wheezing and grossly decreased breath sounds appreciated.  They are endorsing increased sputum production.    Reviewed and confirmed nursing documentation for past medical history, family history, social history.    Initial Assessment:   With the patient's presentation of SOB in the above setting, most likely diagnosis is COPD Exacerbation. Other diagnoses were considered including (but not limited to) CAP, PE, ACS, viral infection, PTX. These are considered less likely due to  history of present illness and physical exam findings.   This is most consistent with an acute life/limb threatening illness complicated by underlying chronic conditions.  Initial Plan:  Empiric treatment of patient's symptoms with immediate initiation of inhaled bronchodilators and IV steroids.  Evaluation for ACS with EKG and delta troponin  Evaluation for infectious versus intrathoracic abnormality with chest x-ray  Evaluation for volume overload with BNP  Screening labs including CBC and Metabolic panel to evaluate for infectious or metabolic etiology of disease.  Patient's Wells score is low and patient does not warrant further objective evaluation for PE based on consistency of presentation of alternative diagnosis.  COVID test positive for rhinovirus fraction Objective evaluation as below reviewed   Initial Study Results:   Laboratory  All laboratory results reviewed without evidence of clinically relevant pathology.    EKG EKG was reviewed independently. Rate, rhythm, axis, intervals all examined and without medically relevant abnormality. ST segments without concerns for elevations.    Radiology:  All images reviewed independently. Agree with radiology report at this time.   DG Chest 2 View Result Date: 02/24/2023 CLINICAL DATA:  COPD EXAM: CHEST - 2 VIEW COMPARISON:  Chest x-ray 12/21/2022 FINDINGS: The heart size and mediastinal contours are within normal limits. Both lungs are clear. The visualized skeletal structures are unremarkable. IMPRESSION: No active cardiopulmonary disease. Electronically Signed   By: Greig Pique M.D.   On: 02/24/2023 19:19   MR PELVIS W WO CONTRAST Result Date: 02/14/2023 CLINICAL DATA:  Left-sided pain EXAM: MRI PELVIS WITHOUT AND WITH CONTRAST TECHNIQUE: Multiplanar multisequence MR imaging of the pelvis was performed both before and after administration of intravenous contrast. CONTRAST:  7 mL Vueway  gadolinium contrast IV COMPARISON:  07/06/2021  FINDINGS: Urinary Tract:  No abnormality visualized. Bowel:  Unremarkable visualized pelvic bowel loops. Vascular/Lymphatic: No pathologically enlarged lymph nodes. No significant vascular abnormality seen. Reproductive:  No mass or other significant abnormality Other:  None. Musculoskeletal: No suspicious bone lesions identified. Status post right hip total arthroplasty. Mild arthrosis of the left hip, similar to prior examination. IMPRESSION: 1. No MRI findings of the pelvis to explain left-sided pelvic pain. 2. Status post right hip total arthroplasty. Mild arthrosis of the left hip. Electronically Signed   By: Marolyn JONETTA Jaksch M.D.   On: 02/14/2023 16:01    Final Assessment and Plan:   After initiation of medical therapies, patient is grossly improved and no longer in acute distress.   Unfortunately she cannot tolerate coming off oxygen , removed from oxygen  she was satting 80%.  She was treated with dexamethasone  IV and attempted to order Paxlovid  but not in formulary, pharmacy consultation for medical management will be needed.  Disposition:   Based on the above findings, I believe this patient is stable for admission.    Patient/family educated about  specific findings on our evaluation and explained exact reasons for admission.  Patient/family educated about clinical situation and time was allowed to answer questions.   Admission team communicated with and agreed with need for admission. Patient admitted. Patient  ready to move at this time.     Emergency Department Medication Summary:   Medications  acetaminophen  (TYLENOL ) tablet 1,000 mg (1,000 mg Oral Given 02/24/23 1833)  dexamethasone  (DECADRON ) injection 6 mg (6 mg Intravenous Given 02/24/23 1953)  ipratropium-albuterol  (DUONEB) 0.5-2.5 (3) MG/3ML nebulizer solution 6 mL (6 mLs Nebulization Given 02/24/23 2319)    Clinical Impression:  1. SOB (shortness of breath)      Admit   Final Clinical Impression(s) / ED Diagnoses Final diagnoses:   SOB (shortness of breath)    Rx / DC Orders ED Discharge Orders     None         Jerral Meth, MD 02/25/23 [phone]

## 2023-02-24 NOTE — ED Notes (Signed)
 Patient o2 dropping to 80-85% on RA. EDP informed.

## 2023-02-24 NOTE — ED Notes (Signed)
 Patient transported to X-ray

## 2023-02-24 NOTE — ED Triage Notes (Signed)
 Reports O2 levels were 87% at home. O2 80% on RA on arrival, went to 97% on 3L Pt has a stuffy nose, fatigue, weakness that started this AM. Hx of COPD, does not wear O2 at home.

## 2023-02-24 NOTE — Telephone Encounter (Signed)
 Patient called answering service  Oxygen levels running 82 to 87%, not on oxygen usually  Congestion, cough  Offered antibiotics and steroids  She is concerned about her oxygen level so I encouraged her to go to the emergency department

## 2023-02-25 ENCOUNTER — Inpatient Hospital Stay (HOSPITAL_COMMUNITY): Payer: Medicare Other

## 2023-02-25 DIAGNOSIS — R0602 Shortness of breath: Secondary | ICD-10-CM | POA: Diagnosis present

## 2023-02-25 DIAGNOSIS — Z8249 Family history of ischemic heart disease and other diseases of the circulatory system: Secondary | ICD-10-CM | POA: Diagnosis not present

## 2023-02-25 DIAGNOSIS — F17210 Nicotine dependence, cigarettes, uncomplicated: Secondary | ICD-10-CM | POA: Diagnosis present

## 2023-02-25 DIAGNOSIS — Z885 Allergy status to narcotic agent status: Secondary | ICD-10-CM | POA: Diagnosis not present

## 2023-02-25 DIAGNOSIS — J441 Chronic obstructive pulmonary disease with (acute) exacerbation: Secondary | ICD-10-CM | POA: Diagnosis present

## 2023-02-25 DIAGNOSIS — Z880 Allergy status to penicillin: Secondary | ICD-10-CM | POA: Diagnosis not present

## 2023-02-25 DIAGNOSIS — E871 Hypo-osmolality and hyponatremia: Secondary | ICD-10-CM

## 2023-02-25 DIAGNOSIS — E785 Hyperlipidemia, unspecified: Secondary | ICD-10-CM | POA: Diagnosis present

## 2023-02-25 DIAGNOSIS — F419 Anxiety disorder, unspecified: Secondary | ICD-10-CM | POA: Diagnosis present

## 2023-02-25 DIAGNOSIS — Z881 Allergy status to other antibiotic agents status: Secondary | ICD-10-CM | POA: Diagnosis not present

## 2023-02-25 DIAGNOSIS — G2581 Restless legs syndrome: Secondary | ICD-10-CM | POA: Diagnosis present

## 2023-02-25 DIAGNOSIS — Z7951 Long term (current) use of inhaled steroids: Secondary | ICD-10-CM | POA: Diagnosis not present

## 2023-02-25 DIAGNOSIS — U071 COVID-19: Secondary | ICD-10-CM | POA: Diagnosis present

## 2023-02-25 DIAGNOSIS — R0902 Hypoxemia: Secondary | ICD-10-CM | POA: Diagnosis not present

## 2023-02-25 DIAGNOSIS — Z716 Tobacco abuse counseling: Secondary | ICD-10-CM | POA: Diagnosis not present

## 2023-02-25 DIAGNOSIS — J9601 Acute respiratory failure with hypoxia: Secondary | ICD-10-CM | POA: Diagnosis present

## 2023-02-25 DIAGNOSIS — Z79899 Other long term (current) drug therapy: Secondary | ICD-10-CM | POA: Diagnosis not present

## 2023-02-25 DIAGNOSIS — Z87442 Personal history of urinary calculi: Secondary | ICD-10-CM | POA: Diagnosis not present

## 2023-02-25 DIAGNOSIS — F32A Depression, unspecified: Secondary | ICD-10-CM | POA: Diagnosis present

## 2023-02-25 DIAGNOSIS — Z888 Allergy status to other drugs, medicaments and biological substances status: Secondary | ICD-10-CM | POA: Diagnosis not present

## 2023-02-25 DIAGNOSIS — Z833 Family history of diabetes mellitus: Secondary | ICD-10-CM | POA: Diagnosis not present

## 2023-02-25 DIAGNOSIS — M1990 Unspecified osteoarthritis, unspecified site: Secondary | ICD-10-CM | POA: Diagnosis present

## 2023-02-25 DIAGNOSIS — Z96641 Presence of right artificial hip joint: Secondary | ICD-10-CM | POA: Diagnosis present

## 2023-02-25 DIAGNOSIS — M810 Age-related osteoporosis without current pathological fracture: Secondary | ICD-10-CM | POA: Diagnosis present

## 2023-02-25 LAB — BASIC METABOLIC PANEL WITH GFR
Anion gap: 9 (ref 5–15)
BUN: 11 mg/dL (ref 8–23)
CO2: 27 mmol/L (ref 22–32)
Calcium: 8.9 mg/dL (ref 8.9–10.3)
Chloride: 100 mmol/L (ref 98–111)
Creatinine, Ser: 0.4 mg/dL — ABNORMAL LOW (ref 0.44–1.00)
GFR, Estimated: >60 mL/min
Glucose, Bld: 217 mg/dL — ABNORMAL HIGH (ref 70–99)
Potassium: 3.7 mmol/L (ref 3.5–5.1)
Sodium: 136 mmol/L (ref 135–145)

## 2023-02-25 LAB — D-DIMER, QUANTITATIVE: D-Dimer, Quant: 0.5 ug{FEU}/mL (ref 0.00–0.50)

## 2023-02-25 MED ORDER — ACETAMINOPHEN 650 MG RE SUPP: 650.0000 mg | Freq: Four times a day (QID) | RECTAL | Status: DC | PRN

## 2023-02-25 MED ORDER — IOHEXOL 350 MG/ML SOLN
75.0000 mL | Freq: Once | INTRAVENOUS | Status: AC | PRN
  Administered 2023-02-25: 75 mL via INTRAVENOUS

## 2023-02-25 MED ORDER — SODIUM CHLORIDE 0.9 % IV SOLN: INTRAVENOUS | Status: AC

## 2023-02-25 MED ORDER — ACETAMINOPHEN 325 MG PO TABS
650.0000 mg | ORAL_TABLET | Freq: Four times a day (QID) | ORAL | Status: DC | PRN
  Administered 2023-02-25 – 2023-02-26 (×3): 650 mg via ORAL
  Filled 2023-02-25 (×3): qty 2

## 2023-02-25 MED ORDER — ACETAMINOPHEN 325 MG PO TABS
650.0000 mg | ORAL_TABLET | Freq: Four times a day (QID) | ORAL | Status: DC | PRN
  Administered 2023-02-25: 650 mg via ORAL
  Filled 2023-02-25: qty 2

## 2023-02-25 MED ORDER — NICOTINE 7 MG/24HR TD PT24
7.0000 mg | MEDICATED_PATCH | Freq: Every day | TRANSDERMAL | Status: DC
  Administered 2023-02-25 – 2023-02-26 (×2): 7 mg via TRANSDERMAL
  Filled 2023-02-25 (×2): qty 1

## 2023-02-25 MED ORDER — ONDANSETRON HCL 4 MG/2ML IJ SOLN: 4.0000 mg | Freq: Four times a day (QID) | INTRAMUSCULAR | Status: DC | PRN

## 2023-02-25 MED ORDER — NIRMATRELVIR/RITONAVIR (PAXLOVID) TABLET (RENAL DOSING)
2.0000 | ORAL_TABLET | Freq: Two times a day (BID) | ORAL | Status: DC
  Administered 2023-02-25 – 2023-02-26 (×3): 2 via ORAL
  Filled 2023-02-25: qty 20

## 2023-02-25 MED ORDER — DEXAMETHASONE SODIUM PHOSPHATE 10 MG/ML IJ SOLN
6.0000 mg | INTRAMUSCULAR | Status: DC
  Administered 2023-02-25: 6 mg via INTRAVENOUS
  Filled 2023-02-25: qty 1

## 2023-02-25 MED ORDER — ENOXAPARIN SODIUM 40 MG/0.4ML IJ SOSY
40.0000 mg | PREFILLED_SYRINGE | INTRAMUSCULAR | Status: DC
  Administered 2023-02-25 – 2023-02-26 (×2): 40 mg via SUBCUTANEOUS
  Filled 2023-02-25 (×2): qty 0.4

## 2023-02-25 MED ORDER — GUAIFENESIN ER 600 MG PO TB12: 600.0000 mg | ORAL_TABLET | Freq: Two times a day (BID) | ORAL | Status: DC | PRN

## 2023-02-25 MED ORDER — IPRATROPIUM-ALBUTEROL 0.5-2.5 (3) MG/3ML IN SOLN: 3.0000 mL | Freq: Four times a day (QID) | RESPIRATORY_TRACT | Status: DC | PRN

## 2023-02-25 MED ORDER — NIRMATRELVIR/RITONAVIR (PAXLOVID)TABLET: 3.0000 | ORAL_TABLET | Freq: Two times a day (BID) | ORAL | Status: DC

## 2023-02-25 MED ORDER — NIRMATRELVIR/RITONAVIR (PAXLOVID) TABLET (RENAL DOSING)
1.0000 | ORAL_TABLET | Freq: Two times a day (BID) | ORAL | Status: DC
  Filled 2023-02-25: qty 20

## 2023-02-25 MED ORDER — MOMETASONE FURO-FORMOTEROL FUM 100-5 MCG/ACT IN AERO
2.0000 | INHALATION_SPRAY | Freq: Two times a day (BID) | RESPIRATORY_TRACT | Status: DC
  Administered 2023-02-25 – 2023-02-26 (×3): 2 via RESPIRATORY_TRACT
  Filled 2023-02-25: qty 8.8

## 2023-02-25 NOTE — Progress Notes (Signed)
 Briefly, patient is a 73 year old female with COPD who was admitted early this morning with COVID-pneumonia.  She was satting 80% on room air which corrected with 3 L nasal cannula.  Does admit to recent exposure with COVID however chest x-ray was negative.  Plan is for CTA to rule out PE.  Was treated for COPD flare as well with steroids and inhaled bronchodilators.  This morning patient states that she feels okay.  She has been walking to the bathroom without difficulty off of her oxygen .  However notes that if she is off her oxygen  for too long she does feel short of breath.  Physical exam is notable for a very comfortable appearing female looking younger than stated age sitting on side of bed in NAD.  No audible wheezes.   Agree with continuing management as initiated with Paxlovid , steroids and inhaled bronchodilators Of note I do not see that a CTA was ordered, will order to rule out PE

## 2023-02-25 NOTE — Plan of Care (Signed)
  Problem: Coping: Goal: Psychosocial and spiritual needs will be supported Outcome: Progressing   Problem: Respiratory: Goal: Will maintain a patent airway Outcome: Progressing   Problem: Nutrition: Goal: Adequate nutrition will be maintained Outcome: Progressing   Problem: Safety: Goal: Ability to remain free from injury will improve Outcome: Progressing
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
## 2023-02-26 DIAGNOSIS — J441 Chronic obstructive pulmonary disease with (acute) exacerbation: Secondary | ICD-10-CM | POA: Diagnosis not present

## 2023-02-26 MED ORDER — MINERAL OIL RE ENEM
1.0000 | ENEMA | Freq: Once | RECTAL | Status: DC
  Filled 2023-02-26: qty 1

## 2023-02-26 MED ORDER — CALCIUM CARBONATE ANTACID 500 MG PO CHEW
1.0000 | CHEWABLE_TABLET | Freq: Three times a day (TID) | ORAL | Status: DC | PRN
  Administered 2023-02-26: 200 mg via ORAL
  Filled 2023-02-26: qty 1

## 2023-02-26 MED ORDER — BUPRENORPHINE HCL-NALOXONE HCL 8-2 MG SL SUBL
1.0000 | SUBLINGUAL_TABLET | Freq: Every day | SUBLINGUAL | Status: DC
  Administered 2023-02-26: 1 via SUBLINGUAL
  Filled 2023-02-26: qty 1

## 2023-02-26 MED ORDER — POLYETHYLENE GLYCOL 3350 17 G PO PACK
17.0000 g | PACK | Freq: Every day | ORAL | Status: DC
  Administered 2023-02-26: 17 g via ORAL
  Filled 2023-02-26: qty 1

## 2023-02-26 MED ORDER — CITALOPRAM HYDROBROMIDE 20 MG PO TABS: 40.0000 mg | ORAL_TABLET | Freq: Every evening | ORAL | Status: DC

## 2023-02-26 MED ORDER — PREDNISONE 20 MG PO TABS: 40.0000 mg | ORAL_TABLET | Freq: Every day | ORAL | 0 refills | Status: AC

## 2023-02-26 MED ORDER — NIRMATRELVIR/RITONAVIR (PAXLOVID) TABLET (RENAL DOSING): 2.0000 | ORAL_TABLET | Freq: Two times a day (BID) | ORAL | Status: AC

## 2023-02-26 MED ORDER — LORATADINE 10 MG PO TABS
10.0000 mg | ORAL_TABLET | Freq: Every day | ORAL | Status: DC
Start: 2023-02-26 — End: 2023-02-26
  Administered 2023-02-26: 10 mg via ORAL
  Filled 2023-02-26: qty 1

## 2023-02-26 MED ORDER — IPRATROPIUM-ALBUTEROL 0.5-2.5 (3) MG/3ML IN SOLN: 3.0000 mL | Freq: Four times a day (QID) | RESPIRATORY_TRACT | 0 refills | Status: DC | PRN

## 2023-02-26 MED ORDER — ATORVASTATIN CALCIUM 10 MG PO TABS: 10.0000 mg | ORAL_TABLET | Freq: Every evening | ORAL | Status: DC

## 2023-02-26 MED ORDER — SMOG ENEMA: 960.0000 mL | Freq: Once | RECTAL | Status: DC | PRN

## 2023-02-26 NOTE — Discharge Summary (Signed)
 Erica Richardson FMW:[id number] DOB: September 25, 1950 DOA: 02/24/2023  PCP: [name], FNP  Admit date: 02/24/2023  Discharge date: 02/26/2023  Admitted From: Home   disposition: Home  Recommendations for Outpatient Follow-up:   Follow up with PCP in 1-2 weeks  Home Health: N/A Equipment/Devices: N/A Consultations: None Discharge Condition: Improved CODE STATUS: Full  Diet Order             Diet regular Room service appropriate? Yes; Fluid consistency: Thin  Diet effective now                    Chief Complaint  Patient presents with   Shortness of Breath     Brief history of present illness from the day of admission and additional interim summary     Erica Richardson is a 73 y.o. female with medical history significant of COPD, tobacco abuse, anxiety, depression, arthritis, hyperlipidemia, osteoporosis, RLS presented to the ED with shortness breath, cough, and wheezing.  Vital signs on arrival: Temperature 102.3 F, pulse 73, respiratory rate 20, blood pressure 147/70, and SpO2 80% on room air. Placed on 2-3 L supplemental oxygen  via nasal cannula. Labs showing no leukocytosis, sodium 134 (baseline 135-137), BNP normal, troponin negative, SARS-CoV-2 PCR positive. Chest x-ray showing no active cardiopulmonary disease. Patient was given Tylenol , IV Decadron  6 mg, and DuoNeb. TRH called to admit.      Patient reports feeling ill for the past 2 days.  Having fevers, body aches, cough productive of clear sputum, and shortness of breath.  Recently exposed to a family member with COVID.  Denies chest pain.  Denies nausea, vomiting, abdominal pain, or diarrhea.  Reports history of COPD and uses Symbicort  2 puffs twice daily.  She is not on home oxygen .  She checked her oxygen  saturation at home and it was in the low  80s which prompted her to come into the ED to be evaluated.  Smokes 0.25 pack of cigarettes daily.                                                                  Hospital Course    Patient was treated for COPD and COVID infection with steroids, inhaled bronchodilators and Paxlovid .  Patient improved daily and was quickly weaned off of her oxygen .  On day of discharge patient was able to ambulate in the hallway off oxygen  and maintain O2 sats 94 to 95%.  Patient states she feels much better and was discharged home on Paxlovid , steroids and inhaled bronchodilators.  Patient was instructed to stop taking her atorvastatin , Valium , and muscle relaxants while she was on the Paxlovid .  Patient was discharged with instructions to follow-up with her PCP in 1 to 2 weeks.  Patient was counseled to stop smoking tobacco.  Discharge diagnosis     Principal Problem:   COPD with acute exacerbation (HCC) Active Problems:   Hyperlipidemia   Depression   Acute hypoxemic respiratory failure (HCC)   COVID-19 virus infection   Hyponatremia    Discharge instructions    Discharge Instructions     Discharge instructions   Complete by: As directed    Since you have COVID, make sure you follow COVID quarantine instructions until you have finished taking the Paxlovid .  Also Paxlovid  has interactions with many medications so please make sure you understand which medications you should not be taking with the Paxlovid .  You will need to take 4 more days of prednisone , I am prescribing this medication for you, you can start tomorrow.  I have also prescribed you nebulizer solution that you should take 3 times a day for the next 3 to 4 days.  Come back if you develop worsening shortness of breath or fevers.   Increase activity slowly   Complete by: As directed        Discharge Medications   Allergies as of 02/26/2023       Reactions   Aleve [naproxen] Other (See Comments)   Chantix [varenicline]     Nightmares   Codeine Other (See Comments)   Upset stomach   Ibuprofen Other (See Comments)   Upset stomach   Meloxicam  Other (See Comments)   Methocarbamol  Diarrhea   Pravastatin Sodium    Severe muscle aches   Simvastatin    Severe muscle aches   Sulfa Antibiotics    Azithromycin Rash   Erythromycin Rash   Prednisone  Anxiety   Pt states she had surgery and 1 week post op was given large doses of IV steroids and she was thrashing around, could not sit still, etc        Medication List     PAUSE taking these medications    atorvastatin  10 MG tablet Wait to take this until: March 03, 2023 Evening Commonly known as: LIPITOR Take 10 mg by mouth every evening.   baclofen  10 MG tablet Wait to take this until: March 03, 2023 Commonly known as: LIORESAL  Take 1 tablet (10 mg total) by mouth 3 (three) times daily.   Buprenorphine  HCl-Naloxone  HCl 8-2 MG Film Wait to take this until: March 03, 2023 Place 0.5 Film under the tongue 2 (two) times daily.   diazepam  5 MG tablet Wait to take this until: March 03, 2023 Commonly known as: VALIUM  Take 2.5 mg by mouth daily as needed for anxiety.       STOP taking these medications    doxycycline  100 MG tablet Commonly known as: VIBRA -TABS       TAKE these medications    acetaminophen  500 MG tablet Commonly known as: TYLENOL  Take 1,000 mg by mouth every 6 (six) hours as needed for mild pain (pain score 1-3) or moderate pain (pain score 4-6).   BIOTIN PO Take 1 tablet by mouth daily.   citalopram  40 MG tablet Commonly known as: CELEXA  Take 40 mg by mouth every evening.   denosumab  60 MG/ML Sosy injection Commonly known as: PROLIA  Inject 60 mg into the skin every 6 (six) months.   Ensifentrine  3 MG/2.5ML Susp Inhale 1 ampule into the lungs in the morning and at bedtime. What changed:  when to take this reasons to take this   fluticasone  50 MCG/ACT nasal spray Commonly known as: FLONASE  Place 1  spray into both nostrils daily as needed for allergies or rhinitis.  furosemide 20 MG tablet Commonly known as: LASIX Take 10-20 mg by mouth daily as needed for fluid or edema.   guaiFENesin -dextromethorphan  100-10 MG/5ML syrup Commonly known as: ROBITUSSIN DM Take 10 mLs by mouth every 4 (four) hours as needed for cough.   HYDROcodone  bit-homatropine 5-1.5 MG/5ML syrup Commonly known as: Hydromet Take 5 mLs by mouth every 6 (six) hours as needed for cough.   ipratropium-albuterol  0.5-2.5 (3) MG/3ML Soln Commonly known as: DUONEB Take 3 mLs by nebulization every 6 (six) hours as needed.   loperamide 2 MG tablet Commonly known as: IMODIUM A-D Take 2 mg by mouth daily as needed for diarrhea or loose stools.   loratadine  10 MG tablet Commonly known as: CLARITIN  Take 10 mg by mouth daily.   mupirocin  ointment 2 % Commonly known as: BACTROBAN  APPLY TO AFFECTED AREA TWICE A DAY   nicotine  14 mg/24hr patch Commonly known as: NICODERM CQ  - dosed in mg/24 hours Place 1 patch (14 mg total) onto the skin daily.   nirmatrelvir /ritonavir  (renal dosing) 10 x 150 MG & 10 x 100MG  Tabs Commonly known as: PAXLOVID  Take 2 tablets by mouth 2 (two) times daily for 5 days. Patient GFR is 60   pramipexole  0.125 MG tablet Commonly known as: MIRAPEX  Take 0.25 mg by mouth at bedtime.   predniSONE  20 MG tablet Commonly known as: DELTASONE  Take 2 tablets (40 mg total) by mouth daily with breakfast for 4 days.   ProAir  HFA 108 (90 Base) MCG/ACT inhaler Generic drug: albuterol  Inhale 1 puff into the lungs every 6 (six) hours as needed for wheezing or shortness of breath.   Symbicort  80-4.5 MCG/ACT inhaler Generic drug: budesonide-formoterol  Inhale 2 puffs into the lungs 2 (two) times daily.          Major procedures and Radiology Reports - PLEASE review detailed and final reports thoroughly  -       CT Angio Chest Pulmonary Embolism (PE) W or WO Contrast Result Date:  02/25/2023 CLINICAL DATA:  High probability for PE.  COVID positive.  Hypoxic. EXAM: CT ANGIOGRAPHY CHEST WITH CONTRAST TECHNIQUE: Multidetector CT imaging of the chest was performed using the standard protocol during bolus administration of intravenous contrast. Multiplanar CT image reconstructions and MIPs were obtained to evaluate the vascular anatomy. RADIATION DOSE REDUCTION: This exam was performed according to the departmental dose-optimization program which includes automated exposure control, adjustment of the mA and/or kV according to patient size and/or use of iterative reconstruction technique. CONTRAST:  75mL OMNIPAQUE  IOHEXOL  350 MG/ML SOLN COMPARISON:  CT of the chest 05/20/2022. FINDINGS: Cardiovascular: Satisfactory opacification of the pulmonary arteries to the segmental level. No evidence of pulmonary embolism. Normal heart size. No pericardial effusion. Mediastinum/Nodes: No enlarged mediastinal, hilar, or axillary lymph nodes. Thyroid  gland, trachea, and esophagus demonstrate no significant findings. Lungs/Pleura: Lungs are clear. No pleural effusion or pneumothorax. Upper Abdomen: No acute abnormality. Musculoskeletal: No chest wall abnormality. No acute or significant osseous findings. Review of the MIP images confirms the above findings. IMPRESSION: No evidence for pulmonary embolism or other acute intrathoracic process. Electronically Signed   By: Greig Pique M.D.   On: 02/25/2023 19:57   DG Chest 2 View Result Date: 02/24/2023 CLINICAL DATA:  COPD EXAM: CHEST - 2 VIEW COMPARISON:  Chest x-ray 12/21/2022 FINDINGS: The heart size and mediastinal contours are within normal limits. Both lungs are clear. The visualized skeletal structures are unremarkable. IMPRESSION: No active cardiopulmonary disease. Electronically Signed   By: Greig Pique HERO.D.  On: 02/24/2023 19:19   MR PELVIS W WO CONTRAST Result Date: 02/14/2023 CLINICAL DATA:  Left-sided pain EXAM: MRI PELVIS WITHOUT AND WITH  CONTRAST TECHNIQUE: Multiplanar multisequence MR imaging of the pelvis was performed both before and after administration of intravenous contrast. CONTRAST:  7 mL Vueway  gadolinium contrast IV COMPARISON:  07/06/2021 FINDINGS: Urinary Tract:  No abnormality visualized. Bowel:  Unremarkable visualized pelvic bowel loops. Vascular/Lymphatic: No pathologically enlarged lymph nodes. No significant vascular abnormality seen. Reproductive:  No mass or other significant abnormality Other:  None. Musculoskeletal: No suspicious bone lesions identified. Status post right hip total arthroplasty. Mild arthrosis of the left hip, similar to prior examination. IMPRESSION: 1. No MRI findings of the pelvis to explain left-sided pelvic pain. 2. Status post right hip total arthroplasty. Mild arthrosis of the left hip. Electronically Signed   By: Marolyn JONETTA Jaksch M.D.   On: 02/14/2023 16:01    Micro Results    Recent Results (from the past 240 hours)  Resp panel by RT-PCR (RSV, Flu A&B, Covid) Anterior Nasal Swab     Status: Abnormal   Collection Time: 02/24/23  6:40 PM   Specimen: Anterior Nasal Swab  Result Value Ref Range Status   SARS Coronavirus 2 by RT PCR POSITIVE (A) NEGATIVE Final    Comment: (NOTE) SARS-CoV-2 target nucleic acids are DETECTED.  The SARS-CoV-2 RNA is generally detectable in upper respiratory specimens during the acute phase of infection. Positive results are indicative of the presence of the identified virus, but do not rule out bacterial infection or co-infection with other pathogens not detected by the test. Clinical correlation with patient history and other diagnostic information is necessary to determine patient infection status. The expected result is Negative.  Fact Sheet for Patients: bloggercourse.com  Fact Sheet for Healthcare Providers: seriousbroker.it  This test is not yet approved or cleared by the [place]  FDA and   has been authorized for detection and/or diagnosis of SARS-CoV-2 by FDA under an Emergency Use Authorization (EUA).  This EUA will remain in effect (meaning this test can be used) for the duration of  the COVID-19 declaration under Section 564(b)(1) of the A ct, 21 U.S.C. section 360bbb-3(b)(1), unless the authorization is terminated or revoked sooner.     Influenza A by PCR NEGATIVE NEGATIVE Final   Influenza B by PCR NEGATIVE NEGATIVE Final    Comment: (NOTE) The Xpert Xpress SARS-CoV-2/FLU/RSV plus assay is intended as an aid in the diagnosis of influenza from Nasopharyngeal swab specimens and should not be used as a sole basis for treatment. Nasal washings and aspirates are unacceptable for Xpert Xpress SARS-CoV-2/FLU/RSV testing.  Fact Sheet for Patients: bloggercourse.com  Fact Sheet for Healthcare Providers: seriousbroker.it  This test is not yet approved or cleared by the [place]  FDA and has been authorized for detection and/or diagnosis of SARS-CoV-2 by FDA under an Emergency Use Authorization (EUA). This EUA will remain in effect (meaning this test can be used) for the duration of the COVID-19 declaration under Section 564(b)(1) of the Act, 21 U.S.C. section 360bbb-3(b)(1), unless the authorization is terminated or revoked.     Resp Syncytial Virus by PCR NEGATIVE NEGATIVE Final    Comment: (NOTE) Fact Sheet for Patients: bloggercourse.com  Fact Sheet for Healthcare Providers: seriousbroker.it  This test is not yet approved or cleared by the [place]  FDA and has been authorized for detection and/or diagnosis of SARS-CoV-2 by FDA under an Emergency Use Authorization (EUA). This EUA will remain in effect (meaning this test  can be used) for the duration of the COVID-19 declaration under Section 564(b)(1) of the Act, 21 U.S.C. section 360bbb-3(b)(1), unless  the authorization is terminated or revoked.  Performed at [facility], [address], [place], [place] [address]     Today   Subjective    Erica Richardson feels much improved since admission.  Feels ready to go home.  Denies chest pain, shortness of breath or abdominal pain.  Feels they can take care of themselves with the resources they have at home.  Objective   Blood pressure (!) 146/69, pulse (!) 54, temperature 97.8 F (36.6 C), temperature source Oral, resp. rate 18, height 5' 3.5 (1.613 m), weight 63 kg, SpO2 98%.   Intake/Output Summary (Last 24 hours) at 02/26/2023 1358 Last data filed at 02/26/2023 0853 Gross per 24 hour  Intake 653.5 ml  Output --  Net 653.5 ml    Exam General: Patient appears well and in good spirits sitting up in bed in no acute distress.  Eyes: sclera anicteric, conjuctiva mild injection bilaterally CVS: S1-S2, regular  Respiratory:  decreased air entry bilaterally secondary to decreased inspiratory effort, rales at bases  GI: NABS, soft, NT  LE: No edema.  Neuro: A/O x 3, Moving all extremities equally with normal strength, CN 3-12 intact, grossly nonfocal.  Psych: patient is logical and coherent, judgement and insight appear normal, mood and affect appropriate to situation.    Data Review   CBC w Diff:  Lab Results  Component Value Date   WBC 6.2 02/24/2023   HGB 12.6 02/24/2023   HCT 38.8 02/24/2023   PLT 222 02/24/2023   LYMPHOPCT 14 02/12/2023   MONOPCT 11 02/12/2023   EOSPCT 0 02/12/2023   BASOPCT 0 02/12/2023    CMP:  Lab Results  Component Value Date   NA 136 02/25/2023   K 3.7 02/25/2023   CL 100 02/25/2023   CO2 27 02/25/2023   BUN 11 02/25/2023   CREATININE 0.40 (L) 02/25/2023   PROT 6.8 02/24/2023   ALBUMIN 4.1 02/24/2023   BILITOT 0.5 02/24/2023   ALKPHOS 45 02/24/2023   AST 20 02/24/2023   ALT 21 02/24/2023  .   Total Time in preparing paper work, data evaluation and todays exam - 35  minutes  Jerico Grisso Tublu Stacy Deshler M.D on 02/26/2023 at 1:58 PM  [facility]

## 2023-02-26 NOTE — Plan of Care (Signed)

## 2023-02-26 NOTE — TOC Initial Note (Signed)
 Transition of Care [facility]) - Initial/Assessment Note    Patient Details  Name: Erica Richardson MRN: [id number] Date of Birth: 1950/04/11  Transition of [facility]) CM/SW Contact:    Sonda Manuella Quill, RN Phone Number: 02/26/2023, 2:48 PM  Clinical Narrative:                 [name] w/ pt and son in room; pt says she lives at home w/ her husband; he is her POC [name] ([phone]); she verified insurance/PCP;  pt says her son will provide transportation; she denies SDOH risks; pt says she has BSC, and shower chair; she does not have HH services or home oxygen ; no TOC needs.  Expected Discharge Plan: Home/Self Care Barriers to Discharge: No Barriers Identified   Patient Goals and CMS Choice Patient states their goals for this hospitalization and ongoing recovery are:: home          Expected Discharge Plan and Services   Discharge Planning Services: CM Consult Post Acute Care Choice: NA Living arrangements for the past 2 months: Single Family Home Expected Discharge Date: 02/26/23               DME Arranged: N/A DME Agency: NA       HH Arranged: NA HH Agency: NA        Prior Living Arrangements/Services Living arrangements for the past 2 months: Single Family Home Lives with:: Spouse Patient language and need for interpreter reviewed:: Yes Do you feel safe going back to the place where you live?: Yes      Need for Family Participation in Patient Care: Yes (Comment) Care giver support system in place?: Yes (comment) Current home services: DME (BSC shower chair) Criminal Activity/Legal Involvement Pertinent to Current Situation/Hospitalization: No - Comment as needed  Activities of Daily Living   ADL Screening (condition at time of admission) Independently performs ADLs?: Yes (appropriate for developmental age) Is the patient deaf or have difficulty hearing?: Yes Does the patient have difficulty seeing, even when wearing glasses/contacts?: Yes Does the  patient have difficulty concentrating, remembering, or making decisions?: No  Permission Sought/Granted Permission sought to share information with : Case Manager Permission granted to share information with : Yes, Verbal Permission Granted  Share Information with NAME: Case Manager     Permission granted to share info w Relationship: [name] (spouse) [phone]     Emotional Assessment Appearance:: Appears stated age Attitude/Demeanor/Rapport: Gracious Affect (typically observed): Accepting Orientation: : Oriented to Self, Oriented to Place, Oriented to  Time, Oriented to Situation Alcohol  / Substance Use: Not Applicable Psych Involvement: No (comment)  Admission diagnosis:  SOB (shortness of breath) [R06.02] COPD with acute exacerbation (HCC) [J44.1] Patient Active Problem List   Diagnosis Date Noted   COVID-19 virus infection 02/25/2023   Hyponatremia 02/25/2023   COPD with acute exacerbation (HCC) 12/21/2022   Low back pain 09/02/2022   Tobacco abuse 04/11/2022   Status post arthroscopy of right shoulder 12/19/2019   Complete tear of right rotator cuff 12/12/2019   Congenital clubfoot 05/23/2019   History of total hip arthroplasty, right 05/22/2017   Chronic pain of right knee 08/31/2016   Bilateral swelling of feet and ankles 03/29/2016   Acute hypoxemic respiratory failure (HCC)    COPD, severe (HCC) 02/14/2016   Status post total replacement of right hip 02/05/2016   Unilateral primary osteoarthritis, right hip 01/29/2016   Hyperlipidemia 02/01/2010   Depression 02/01/2010   Arthropathy 02/01/2010   ABDOMINAL BLOATING 02/01/2010  Abdominal pain 02/01/2010   Anxiety state 01/29/2010   MENOPAUSAL SYNDROME 01/29/2010   History of colonic polyps 01/29/2010   PCP:  [name], FNP Pharmacy:   [phone] - [place], [place] - [address] [place] [address] Phone: [phone] Fax: [phone]  [id number] [place], [place] - [address] [place] [address] Phone: [phone] Fax: [phone]     Social Drivers of Health (SDOH) Social History: SDOH Screenings   Food Insecurity: No Food Insecurity (02/26/2023)  Housing: Low Risk  (02/26/2023)  Transportation Needs: No Transportation Needs (02/26/2023)  Utilities: Not At Risk (02/26/2023)  Financial Resource Strain: Low Risk  (11/06/2022)   Received from [facility]  Social Connections: Moderately Isolated (02/25/2023)  Tobacco Use: High Risk (02/24/2023)   SDOH Interventions: Food Insecurity Interventions: Intervention Not Indicated, Inpatient TOC Housing Interventions: Intervention Not Indicated, Inpatient TOC Transportation Interventions: Intervention Not Indicated, Inpatient TOC Utilities Interventions: Intervention Not Indicated, Inpatient TOC Social Connections Interventions: Intervention Not Indicated   Readmission Risk Interventions    02/26/2023    2:44 PM 12/22/2022    3:45 PM  Readmission Risk Prevention Plan  Post Dischage Appt  Complete  Medication Screening  Complete  Transportation Screening Complete Complete  PCP or Specialist Appt within 5-7 Days Complete   Home Care Screening Complete   Medication Review (RN CM) Complete

## 2023-03-01 ENCOUNTER — Telehealth: Payer: Self-pay | Admitting: Physical Medicine and Rehabilitation

## 2023-03-01 NOTE — Telephone Encounter (Signed)
 Patient called needing to schedule an appointment with Dr. Alvester Morin for an injection in her back. The number to contact patient is [phone]

## 2023-03-02 ENCOUNTER — Other Ambulatory Visit: Payer: Self-pay | Admitting: Physical Medicine and Rehabilitation

## 2023-03-02 DIAGNOSIS — M461 Sacroiliitis, not elsewhere classified: Secondary | ICD-10-CM

## 2023-03-08 ENCOUNTER — Encounter: Payer: Self-pay | Admitting: Internal Medicine

## 2023-03-08 DIAGNOSIS — J441 Chronic obstructive pulmonary disease with (acute) exacerbation: Secondary | ICD-10-CM | POA: Diagnosis not present

## 2023-03-08 DIAGNOSIS — R0902 Hypoxemia: Secondary | ICD-10-CM | POA: Diagnosis not present

## 2023-03-08 DIAGNOSIS — Z09 Encounter for follow-up examination after completed treatment for conditions other than malignant neoplasm: Secondary | ICD-10-CM | POA: Diagnosis not present

## 2023-03-09 DIAGNOSIS — L97411 Non-pressure chronic ulcer of right heel and midfoot limited to breakdown of skin: Secondary | ICD-10-CM | POA: Diagnosis not present

## 2023-03-10 DIAGNOSIS — E663 Overweight: Secondary | ICD-10-CM | POA: Diagnosis not present

## 2023-03-10 DIAGNOSIS — E559 Vitamin D deficiency, unspecified: Secondary | ICD-10-CM | POA: Diagnosis not present

## 2023-03-10 DIAGNOSIS — M129 Arthropathy, unspecified: Secondary | ICD-10-CM | POA: Diagnosis not present

## 2023-03-10 DIAGNOSIS — G8929 Other chronic pain: Secondary | ICD-10-CM | POA: Diagnosis not present

## 2023-03-10 DIAGNOSIS — M5489 Other dorsalgia: Secondary | ICD-10-CM | POA: Diagnosis not present

## 2023-03-10 DIAGNOSIS — Z131 Encounter for screening for diabetes mellitus: Secondary | ICD-10-CM | POA: Diagnosis not present

## 2023-03-10 DIAGNOSIS — Z0000 Encounter for general adult medical examination without abnormal findings: Secondary | ICD-10-CM | POA: Diagnosis not present

## 2023-03-10 DIAGNOSIS — Z79899 Other long term (current) drug therapy: Secondary | ICD-10-CM | POA: Diagnosis not present

## 2023-03-10 DIAGNOSIS — Z1159 Encounter for screening for other viral diseases: Secondary | ICD-10-CM | POA: Diagnosis not present

## 2023-03-14 DIAGNOSIS — Z79899 Other long term (current) drug therapy: Secondary | ICD-10-CM | POA: Diagnosis not present

## 2023-03-15 ENCOUNTER — Ambulatory Visit: Payer: Medicare Other | Admitting: Internal Medicine

## 2023-03-16 ENCOUNTER — Other Ambulatory Visit: Payer: Self-pay

## 2023-03-16 ENCOUNTER — Ambulatory Visit (INDEPENDENT_AMBULATORY_CARE_PROVIDER_SITE_OTHER): Payer: Medicare Other | Admitting: Physical Medicine and Rehabilitation

## 2023-03-16 DIAGNOSIS — M461 Sacroiliitis, not elsewhere classified: Secondary | ICD-10-CM

## 2023-03-16 NOTE — Progress Notes (Signed)
Erica Richardson - 73 y.o. female MRN [id number]  Date of birth: June 06, 1950  Office Visit Note: Visit Date: 03/16/2023 PCP: Genia Hotter, FNP Referred by: Genia Hotter, FNP  Subjective: Chief Complaint  Patient presents with   Lower Back - Pain   HPI:  Erica Richardson is a 73 y.o. female who comes in today at the request of Ellin Goodie, FNP for planned Left anesthetic Sacroiliac joint arthrogram with fluoroscopic guidance.  The patient has failed conservative care including home exercise, medications, time and activity modification.  This injection will be diagnostic and hopefully therapeutic.  Please see requesting physician notes for further details and justification.  We have actually been trying to get her back into have the injection done after our last office visit.  Unfortunately she has had a couple bouts of pneumonia and some other sickness in between then.  She is doing okay at this point we will complete the injection today.  Positive Fortin finger sign, Patrick's testing  and lateral compression test.    ROS Otherwise per HPI.  Assessment & Plan: Visit Diagnoses:    ICD-10-CM   1. Sacroiliitis (HCC)  M46.1 XR C-ARM NO REPORT    Left Sacroiliac Joint Inj      Plan: No additional findings.   Meds & Orders: No orders of the defined types were placed in this encounter.   Orders Placed This Encounter  Procedures   Left Sacroiliac Joint Inj   XR C-ARM NO REPORT    Follow-up: Return for visit to requesting provider as needed.   Procedures: Left Sacroiliac Joint Inj on 03/16/2023 1:35 PM Indications: pain and diagnostic evaluation Details: 22 G 3.5 in needle, fluoroscopy-guided posterior approach Medications: 2 mL bupivacaine 0.5 %; 40 mg methylPREDNISolone acetate 40 MG/ML Outcome: tolerated well, no immediate complications  Sacroiliac Joint Intra-Articular Injection - Posterior Approach with Fluoroscopic Guidance   Position: PRONE  Additional  Comments: Vital signs were monitored before and after the procedure. Patient was prepped and draped in the usual sterile fashion. The correct patient, procedure, and site was verified.   Injection Procedure Details:   Location/Site: Left Sacroiliac joint  Needle size: 3.5 in Spinal Needle  Needle type: Spinal  Needle Placement: Intra-articular  Findings:  -Comments: There was excellent flow of contrast producing a partial arthrogram of the sacroiliac joint.   Procedure Details: Starting with a 90 degree vertical and midline orientation the fluoroscope was tilted cranially 20 to 25 degrees and the target area of the inferior most part of the SI joint on the side mentioned above was visualized.  The soft tissues overlying this target were infiltrated with 4 ml. of 1% Lidocaine without Epinephrine. A #22 gauge spinal needle was inserted perpendicular to the fluoroscope table and advanced into the posterior inferior joint space using fluoroscopic guidance.  Position in the joint space was confirmed by obtaining a partial arthrogram using a 2 ml. volume of Isovue-250 contrast agent. After negative aspirate for gross pus or blood, the injectate was delivered to the joint. Radiographs were obtained for documentation purposes.   Additional Comments:   Dressing: Bandaid    Post-procedure details: Patient was observed during the procedure. Post-procedure instructions were reviewed.  Patient left the clinic in stable condition.    There was excellent flow of contrast producing a partial arthrogram of the sacroiliac joint.  Procedure, treatment alternatives, risks and benefits explained, specific risks discussed. Consent was given by the patient. Immediately prior to procedure a time out  was called to verify the correct patient, procedure, equipment, support staff and site/side marked as required. Patient was prepped and draped in the usual sterile fashion.          Clinical  History: MRI LUMBAR SPINE WITHOUT CONTRAST   TECHNIQUE: Multiplanar, multisequence MR imaging of the lumbar spine was performed. No intravenous contrast was administered.   COMPARISON:  Lumbar spine MRI 12/15/2017   FINDINGS: Segmentation: Standard; the lowest formed disc space is designated L5-S1.   Alignment:  Normal.   Vertebrae: Background marrow signal is normal. There is no suspicious marrow signal abnormality or marrow edema. Again seen is incomplete posterior fusion of the posterior elements at L5 and S1.   Conus medullaris and cauda equina: Conus extends to the L5 level with cleft at the midline at the L3 level. The small lipoma at the S2-S3 level is unchanged. Consistent with a tethered cord.   Paraspinal and other soft tissues: There is mild perifacetal soft tissue edema on the left at L3-L4 and L4-L5. There is a small posteriorly projecting synovial cyst arising from the left L5-S1 facet joint.   Disc levels:   The disc heights are overall preserved   T12-L1: There is a shallow right paracentral disc protrusion/extrusion with slight inferior migration but no significant spinal canal or neural foraminal stenosis. The extrusion is new since 2019.   L1-L2: There is a new small right paracentral protrusion and mild facet arthropathy without significant spinal canal or neural foraminal stenosis.   L2-L3: There is a new small central protrusion and mild facet arthropathy without significant spinal canal or neural foraminal stenosis.   L3-L4: There is a mild disc bulge eccentric to the left and moderate left worse than right facet arthropathy resulting in mild-to-moderate left and mild right neural foraminal stenosis without significant spinal canal stenosis. The foraminal stenosis is worsened since 2019.   L4-L5: Mild left worse than right facet arthropathy without significant spinal canal or neural foraminal stenosis, not significantly changed.   L5-S1:  There is moderate left and mild right facet arthropathy resulting in mild left worse than right neural foraminal stenosis without significant spinal canal stenosis, not significantly changed.   IMPRESSION: 1. Multilevel facet arthropathy, most advanced on the left at L3-L4 and L5-S1 with mild perifacetal soft tissue edema at L3-L4 and L4-L5, which may reflect a source of pain. 2. Mild-to-moderate left and mild right neural foraminal stenosis at L3-L4, worsened since 2019. 3. Mild left worse than right neural foraminal stenosis at L5-S1, not significantly changed. 4. New small disc protrusions at T12-L1 through L2-L3 without significant spinal canal or neural foraminal stenosis. 5. Tethered cord extending to the L5 level, unchanged.     Electronically Signed   By: Lesia Hausen M.D.   On: 03/01/2022 10:50     Objective:  VS:  HT:    WT:   BMI:     BP:   HR: bpm  TEMP: ( )  RESP:  Physical Exam   Imaging: No results found.

## 2023-03-16 NOTE — Progress Notes (Signed)
Functional Pain Scale - descriptive words and definitions  Moderate (4)   Constantly aware of pain, can complete ADLs with modification/sleep marginally affected at times/passive distraction is of no use, but active distraction gives some relief. Moderate range order  Average Pain 3  L>R  She is very stiff from the cold weather. +Driver, -BT, -Dye Allergies.

## 2023-03-16 NOTE — Patient Instructions (Signed)

## 2023-03-21 NOTE — Telephone Encounter (Signed)
Patient was a no show on 1/22.  No further scheduled visits.  She was last seen in office in November of 2024.  Will await call from patient to schedule f/u.  Nothing further needed.

## 2023-03-23 MED ORDER — BUPIVACAINE HCL 0.5 % IJ SOLN
2.0000 mL | INTRAMUSCULAR | Status: AC | PRN
  Administered 2023-03-16: 2 mL via INTRA_ARTICULAR

## 2023-03-23 MED ORDER — METHYLPREDNISOLONE ACETATE 40 MG/ML IJ SUSP
40.0000 mg | INTRAMUSCULAR | Status: AC | PRN
  Administered 2023-03-16: 40 mg via INTRA_ARTICULAR

## 2023-03-24 DIAGNOSIS — Z79899 Other long term (current) drug therapy: Secondary | ICD-10-CM | POA: Diagnosis not present

## 2023-03-24 DIAGNOSIS — G8929 Other chronic pain: Secondary | ICD-10-CM | POA: Diagnosis not present

## 2023-03-24 DIAGNOSIS — M5489 Other dorsalgia: Secondary | ICD-10-CM | POA: Diagnosis not present

## 2023-03-24 DIAGNOSIS — R7303 Prediabetes: Secondary | ICD-10-CM | POA: Diagnosis not present

## 2023-03-24 DIAGNOSIS — E559 Vitamin D deficiency, unspecified: Secondary | ICD-10-CM | POA: Diagnosis not present

## 2023-03-24 DIAGNOSIS — E663 Overweight: Secondary | ICD-10-CM | POA: Diagnosis not present

## 2023-03-24 NOTE — Telephone Encounter (Signed)
Closing encounter - pt will need to complete her part of [place] form to move forward with initiating this medication for her COPD  Chesley Mires, PharmD, MPH, BCPS, CPP Clinical Pharmacist (Rheumatology and Pulmonology)

## 2023-03-28 ENCOUNTER — Telehealth: Payer: Self-pay | Admitting: Physical Medicine and Rehabilitation

## 2023-03-28 DIAGNOSIS — Z79899 Other long term (current) drug therapy: Secondary | ICD-10-CM | POA: Diagnosis not present

## 2023-03-28 DIAGNOSIS — M47816 Spondylosis without myelopathy or radiculopathy, lumbar region: Secondary | ICD-10-CM

## 2023-03-28 NOTE — Telephone Encounter (Signed)
Patient called. The injection did not work.

## 2023-03-29 DIAGNOSIS — Z79899 Other long term (current) drug therapy: Secondary | ICD-10-CM | POA: Diagnosis not present

## 2023-03-29 DIAGNOSIS — E559 Vitamin D deficiency, unspecified: Secondary | ICD-10-CM | POA: Diagnosis not present

## 2023-03-29 DIAGNOSIS — G8929 Other chronic pain: Secondary | ICD-10-CM | POA: Diagnosis not present

## 2023-03-29 DIAGNOSIS — M5489 Other dorsalgia: Secondary | ICD-10-CM | POA: Diagnosis not present

## 2023-03-29 DIAGNOSIS — Z6824 Body mass index (BMI) 24.0-24.9, adult: Secondary | ICD-10-CM | POA: Diagnosis not present

## 2023-03-29 DIAGNOSIS — R7303 Prediabetes: Secondary | ICD-10-CM | POA: Diagnosis not present

## 2023-03-29 DIAGNOSIS — R110 Nausea: Secondary | ICD-10-CM | POA: Diagnosis not present

## 2023-03-30 DIAGNOSIS — M810 Age-related osteoporosis without current pathological fracture: Secondary | ICD-10-CM | POA: Diagnosis not present

## 2023-04-01 NOTE — Addendum Note (Signed)
 Addended by: Milas Alias on: 04/01/2023 09:31 AM   Modules accepted: Orders

## 2023-04-01 NOTE — Telephone Encounter (Signed)
 I put an order for a left L3-4 and 4-5 and L5-S1 facet joint blocks.

## 2023-04-03 DIAGNOSIS — Z79899 Other long term (current) drug therapy: Secondary | ICD-10-CM | POA: Diagnosis not present

## 2023-04-05 DIAGNOSIS — G8929 Other chronic pain: Secondary | ICD-10-CM | POA: Diagnosis not present

## 2023-04-05 DIAGNOSIS — Z79899 Other long term (current) drug therapy: Secondary | ICD-10-CM | POA: Diagnosis not present

## 2023-04-05 DIAGNOSIS — R7303 Prediabetes: Secondary | ICD-10-CM | POA: Diagnosis not present

## 2023-04-05 DIAGNOSIS — E559 Vitamin D deficiency, unspecified: Secondary | ICD-10-CM | POA: Diagnosis not present

## 2023-04-05 DIAGNOSIS — R110 Nausea: Secondary | ICD-10-CM | POA: Diagnosis not present

## 2023-04-05 DIAGNOSIS — M5489 Other dorsalgia: Secondary | ICD-10-CM | POA: Diagnosis not present

## 2023-04-05 DIAGNOSIS — Z6823 Body mass index (BMI) 23.0-23.9, adult: Secondary | ICD-10-CM | POA: Diagnosis not present

## 2023-04-07 DIAGNOSIS — Z79899 Other long term (current) drug therapy: Secondary | ICD-10-CM | POA: Diagnosis not present

## 2023-04-11 DIAGNOSIS — R7303 Prediabetes: Secondary | ICD-10-CM | POA: Diagnosis not present

## 2023-04-11 DIAGNOSIS — R110 Nausea: Secondary | ICD-10-CM | POA: Diagnosis not present

## 2023-04-11 DIAGNOSIS — G8929 Other chronic pain: Secondary | ICD-10-CM | POA: Diagnosis not present

## 2023-04-11 DIAGNOSIS — M5489 Other dorsalgia: Secondary | ICD-10-CM | POA: Diagnosis not present

## 2023-04-11 DIAGNOSIS — Z79899 Other long term (current) drug therapy: Secondary | ICD-10-CM | POA: Diagnosis not present

## 2023-04-11 DIAGNOSIS — E559 Vitamin D deficiency, unspecified: Secondary | ICD-10-CM | POA: Diagnosis not present

## 2023-04-11 DIAGNOSIS — Z6823 Body mass index (BMI) 23.0-23.9, adult: Secondary | ICD-10-CM | POA: Diagnosis not present

## 2023-04-13 ENCOUNTER — Other Ambulatory Visit: Payer: Self-pay

## 2023-04-13 ENCOUNTER — Ambulatory Visit (INDEPENDENT_AMBULATORY_CARE_PROVIDER_SITE_OTHER): Payer: Medicare Other | Admitting: Physical Medicine and Rehabilitation

## 2023-04-13 VITALS — BP 154/72 | HR 79

## 2023-04-13 DIAGNOSIS — M25511 Pain in right shoulder: Secondary | ICD-10-CM | POA: Diagnosis not present

## 2023-04-13 DIAGNOSIS — M7918 Myalgia, other site: Secondary | ICD-10-CM | POA: Diagnosis not present

## 2023-04-13 DIAGNOSIS — M461 Sacroiliitis, not elsewhere classified: Secondary | ICD-10-CM | POA: Diagnosis not present

## 2023-04-13 DIAGNOSIS — M545 Low back pain, unspecified: Secondary | ICD-10-CM | POA: Diagnosis not present

## 2023-04-13 DIAGNOSIS — G894 Chronic pain syndrome: Secondary | ICD-10-CM | POA: Diagnosis not present

## 2023-04-13 DIAGNOSIS — G8929 Other chronic pain: Secondary | ICD-10-CM

## 2023-04-13 DIAGNOSIS — M47816 Spondylosis without myelopathy or radiculopathy, lumbar region: Secondary | ICD-10-CM | POA: Diagnosis not present

## 2023-04-13 MED ORDER — METHYLPREDNISOLONE ACETATE 40 MG/ML IJ SUSP
40.0000 mg | Freq: Once | INTRAMUSCULAR | Status: AC
  Administered 2023-04-13: 40 mg

## 2023-04-13 NOTE — Patient Instructions (Signed)

## 2023-04-13 NOTE — Progress Notes (Signed)
Pain score----4 No Allergies to Contrast No blood thinners

## 2023-04-14 ENCOUNTER — Other Ambulatory Visit (HOSPITAL_BASED_OUTPATIENT_CLINIC_OR_DEPARTMENT_OTHER): Payer: Self-pay

## 2023-04-14 ENCOUNTER — Emergency Department (HOSPITAL_BASED_OUTPATIENT_CLINIC_OR_DEPARTMENT_OTHER)
Admission: EM | Admit: 2023-04-14 | Discharge: 2023-04-14 | Disposition: A | Discharge status: Home / Self Care | Payer: Medicare Other | Attending: Emergency Medicine | Admitting: Emergency Medicine

## 2023-04-14 ENCOUNTER — Other Ambulatory Visit: Payer: Self-pay

## 2023-04-14 ENCOUNTER — Encounter (HOSPITAL_BASED_OUTPATIENT_CLINIC_OR_DEPARTMENT_OTHER): Payer: Self-pay | Admitting: Emergency Medicine

## 2023-04-14 DIAGNOSIS — R059 Cough, unspecified: Secondary | ICD-10-CM | POA: Diagnosis present

## 2023-04-14 DIAGNOSIS — F17210 Nicotine dependence, cigarettes, uncomplicated: Secondary | ICD-10-CM | POA: Diagnosis not present

## 2023-04-14 DIAGNOSIS — Z7951 Long term (current) use of inhaled steroids: Secondary | ICD-10-CM | POA: Diagnosis not present

## 2023-04-14 DIAGNOSIS — R051 Acute cough: Secondary | ICD-10-CM

## 2023-04-14 DIAGNOSIS — J449 Chronic obstructive pulmonary disease, unspecified: Secondary | ICD-10-CM | POA: Insufficient documentation

## 2023-04-14 DIAGNOSIS — J101 Influenza due to other identified influenza virus with other respiratory manifestations: Secondary | ICD-10-CM

## 2023-04-14 DIAGNOSIS — Z79899 Other long term (current) drug therapy: Secondary | ICD-10-CM | POA: Diagnosis not present

## 2023-04-14 DIAGNOSIS — M791 Myalgia, unspecified site: Secondary | ICD-10-CM

## 2023-04-14 LAB — RESP PANEL BY RT-PCR (RSV, FLU A&B, COVID)  RVPGX2
Influenza A by PCR: POSITIVE — AB
Influenza B by PCR: NEGATIVE
Resp Syncytial Virus by PCR: NEGATIVE
SARS Coronavirus 2 by RT PCR: NEGATIVE

## 2023-04-14 MED ORDER — OSELTAMIVIR PHOSPHATE 75 MG PO CAPS: 75.0000 mg | ORAL_CAPSULE | Freq: Two times a day (BID) | ORAL | 0 refills | Status: AC

## 2023-04-14 MED ORDER — METOCLOPRAMIDE HCL 10 MG PO TABS
10.0000 mg | ORAL_TABLET | Freq: Once | ORAL | Status: AC
  Administered 2023-04-14: 10 mg via ORAL
  Filled 2023-04-14: qty 1

## 2023-04-14 MED ORDER — BENZONATATE 100 MG PO CAPS: 100.0000 mg | ORAL_CAPSULE | Freq: Three times a day (TID) | ORAL | 0 refills | Status: DC | PRN

## 2023-04-14 NOTE — ED Triage Notes (Signed)
Pt caox4 c/o nausea, sore throat, headache, bodyaches, fatigue since last night. States she last took zofran at approx with no relief. Denies vomiting and diarrhea.

## 2023-04-14 NOTE — ED Provider Notes (Signed)
 [facility] AT [facility] Provider Note   CSN: [id number] Arrival date & time: 04/14/23  1241     History  Chief Complaint  Patient presents with   Generalized Body Aches    Erica Richardson is a 73 y.o. female.  HPI   73 year old female presents emergency department complaints of cough, congestion, body aches, fatigue.  Reports symptoms beginning yesterday.  Denies any known sick contact.  Does report being on pain management for her chronic low back pain.  Reports some feelings of nausea that was somewhat refractory to Zofran today prompting visit to the emergency department.  Denies any fever, chills, chest pain, shortness of breath, abdominal pain, nausea, vomiting.  Past medical history significant for COPD, nephrolithiasis, hyperlipidemia, restless leg syndrome, osteoporosis.  Home Medications Prior to Admission medications   Medication Sig Start Date End Date Taking? Authorizing Provider  benzonatate (TESSALON) 100 MG capsule Take 1 capsule (100 mg total) by mouth 3 (three) times daily as needed. 04/14/23  Yes Sherian Maroon A, PA  lidocaine (LIDODERM) 5 % Place 1 patch onto the skin 2 (two) times daily between meals as needed. 03/30/23  Yes [provider]  ondansetron (ZOFRAN-ODT) 8 MG disintegrating tablet Take 8 mg by mouth every 6 (six) hours as needed. 04/05/23  Yes [provider]  oseltamivir (TAMIFLU) 75 MG capsule Take 1 capsule (75 mg total) by mouth every 12 (twelve) hours. 04/14/23  Yes Sherian Maroon A, PA  oxyCODONE (OXY IR/ROXICODONE) 5 MG immediate release tablet Take 5 mg by mouth every 6 (six) hours as needed. 03/29/23  Yes [provider]  Oxycodone HCl 10 MG TABS Take 10 mg by mouth every 6 (six) hours as needed. 04/05/23  Yes [provider]  Vitamin D, Ergocalciferol, (DRISDOL) 1.25 MG (50000 UNIT) CAPS capsule Take 50,000 Units by mouth once a week. 03/29/23  Yes [provider]   acetaminophen (TYLENOL) 500 MG tablet Take 1,000 mg by mouth every 6 (six) hours as needed for mild pain (pain score 1-3) or moderate pain (pain score 4-6).    [provider]  albuterol (PROAIR HFA) 108 (90 Base) MCG/ACT inhaler Inhale 1 puff into the lungs every 6 (six) hours as needed for wheezing or shortness of breath. 04/17/18   [provider]  atorvastatin (LIPITOR) 10 MG tablet Take 10 mg by mouth every evening.    [provider]  baclofen (LIORESAL) 10 MG tablet Take 1 tablet (10 mg total) by mouth 3 (three) times daily. Patient taking differently: Take 5 mg by mouth at bedtime as needed for muscle spasms. 11/15/22   Juanda Chance, NP  BIOTIN PO Take 1 tablet by mouth daily.    [provider]  Buprenorphine HCl-Naloxone HCl 8-2 MG FILM Place 0.5 Film under the tongue 2 (two) times daily. 02/20/23   [provider]  citalopram (CELEXA) 40 MG tablet Take 40 mg by mouth every evening.    [provider]  denosumab (PROLIA) 60 MG/ML SOSY injection Inject 60 mg into the skin every 6 (six) months.    [provider]  diazepam (VALIUM) 5 MG tablet Take 2.5 mg by mouth daily as needed for anxiety. 01/13/16   [provider]  Ensifentrine 3 MG/2.5ML SUSP Inhale 1 ampule into the lungs in the morning and at bedtime. Patient taking differently: Inhale 1 ampule into the lungs as needed (wheezing/SOB). 01/12/23   Charlott Holler, MD  fluticasone Aleda Grana) 50 MCG/ACT nasal spray  Place 1 spray into both nostrils daily as needed for allergies or rhinitis.    [provider]  furosemide (LASIX) 20 MG tablet Take 10-20 mg by mouth daily as needed for fluid or edema. 12/12/22   [provider]  guaiFENesin-dextromethorphan (ROBITUSSIN DM) 100-10 MG/5ML syrup Take 10 mLs by mouth every 4 (four) hours as needed for cough. 05/31/21   Glade Lloyd, MD  HYDROcodone bit-homatropine (HYDROMET) 5-1.5 MG/5ML syrup Take 5  mLs by mouth every 6 (six) hours as needed for cough. Patient not taking: Reported on 02/25/2023 11/09/22   Virl Diamond A, MD  ipratropium-albuterol (DUONEB) 0.5-2.5 (3) MG/3ML SOLN Take 3 mLs by nebulization every 6 (six) hours as needed. 02/26/23 03/28/23  Pieter Partridge, MD  loperamide (IMODIUM A-D) 2 MG tablet Take 2 mg by mouth daily as needed for diarrhea or loose stools.    [provider]  loratadine (CLARITIN) 10 MG tablet Take 10 mg by mouth daily.    [provider]  nicotine (NICODERM CQ - DOSED IN MG/24 HOURS) 14 mg/24hr patch Place 1 patch (14 mg total) onto the skin daily. 12/24/22   Joseph Art, DO  pramipexole (MIRAPEX) 0.125 MG tablet Take 0.25 mg by mouth at bedtime. 06/24/22   [provider]  SYMBICORT 80-4.5 MCG/ACT inhaler Inhale 2 puffs into the lungs 2 (two) times daily. 06/01/22   Bevelyn Ngo, NP      Allergies    Aleve [naproxen], Chantix [varenicline], Codeine, Ibuprofen, Meloxicam, Methocarbamol, Pravastatin sodium, Simvastatin, Sulfa antibiotics, Azithromycin, Erythromycin, and Prednisone    Review of Systems   Review of Systems  All other systems reviewed and are negative.   Physical Exam Updated Vital Signs BP (!) 144/58 (BP Location: Right Arm)   Pulse 69   Temp 98.6 F (37 C) (Oral)   Resp 20   Ht 5\' 4"  (1.626 m)   Wt 61.2 kg   SpO2 93%   BMI 23.17 kg/m  Physical Exam Vitals and nursing note reviewed.  Constitutional:      General: She is not in acute distress.    Appearance: She is well-developed.  HENT:     Head: Normocephalic and atraumatic.     Nose: No congestion or rhinorrhea.     Mouth/Throat:     Pharynx: No oropharyngeal exudate or posterior oropharyngeal erythema.  Eyes:     Conjunctiva/sclera: Conjunctivae normal.  Cardiovascular:     Rate and Rhythm: Normal rate and regular rhythm.  Pulmonary:     Effort: Pulmonary effort is normal. No respiratory distress.     Breath sounds: Normal  breath sounds. No wheezing, rhonchi or rales.  Abdominal:     Palpations: Abdomen is soft.     Tenderness: There is no abdominal tenderness.  Musculoskeletal:        General: No swelling.     Cervical back: Neck supple.  Skin:    General: Skin is warm and dry.     Capillary Refill: Capillary refill takes less than 2 seconds.  Neurological:     Mental Status: She is alert.  Psychiatric:        Mood and Affect: Mood normal.     ED Results / Procedures / Treatments   Labs (all labs ordered are listed, but only abnormal results are displayed) Labs Reviewed  RESP PANEL BY RT-PCR (RSV, FLU A&B, COVID)  [id number] - Abnormal; Notable for the following components:      Result Value   Influenza A by  PCR POSITIVE (*)    All other components within normal limits    EKG None  Radiology XR C-ARM NO REPORT Result Date: 04/13/2023 Please see Notes tab for imaging impression.   Procedures Procedures    Medications Ordered in ED Medications  metoCLOPramide (REGLAN) tablet 10 mg (10 mg Oral Given 04/14/23 1329)    ED Course/ Medical Decision Making/ A&P                                 Medical Decision Making Risk Prescription drug management.   This patient presents to the ED for concern of cough, congestion, myalgia, nausea, this involves an extensive number of treatment options, and is a complaint that carries with it a high risk of complications and morbidity.  The differential diagnosis includes COVID, flu, RSV, pneumonia, sepsis, other   Co morbidities that complicate the patient evaluation  See HPI   Additional history obtained:  Additional history obtained from EMR External records from outside source obtained and reviewed including hospital records   Lab Tests:  I Ordered, and personally interpreted labs.  The pertinent results include: Influenza A positive   Imaging Studies ordered:  N/a   Cardiac Monitoring: / EKG:  The patient was maintained on a  cardiac monitor.  I personally viewed and interpreted the cardiac monitored which showed an underlying rhythm of: Sinus rhythm   Consultations Obtained:  N/a   Problem List / ED Course / Critical interventions / Medication management  Influenza A, cough, myalgia I ordered medication including Reglan  Reevaluation of the patient after these medicines showed that the patient improved I have reviewed the patients home medicines and have made adjustments as needed   Social Determinants of Health:  Chronic cigarette use.  Denies illicit drug use.   Test / Admission - Considered:  Influenza A, cough, myalgia Vitals signs significant for hypertension blood pressure 144 rate 58. Otherwise within normal range and stable throughout visit. Laboratory/imaging studies significant for: See above 73 year old female presents emergency department with complaints of cough, congestion, body aches, fatigue.  Symptom onset yesterday.  On exam, lungs clear to auscultation bilaterally; low suspicion for pneumonia.  Patient symptoms most consistent with viral illness.  Viral testing positive for influenza A which is most likely causing symptoms.  Will recommend symptomatic therapy as described in AVS with close follow-up with PCP in the outpatient setting.  Treatment plan discussed with patient and she acknowledged understanding was agreeable to said plan.  Patient overall well-appearing, afebrile in no acute distress. Worrisome signs and symptoms were discussed with the patient, and the patient acknowledged understanding to return to the ED if noticed. Patient was stable upon discharge.          Final Clinical Impression(s) / ED Diagnoses Final diagnoses:  Influenza A  Myalgia  Acute cough    Rx / DC Orders ED Discharge Orders          Ordered    benzonatate (TESSALON) 100 MG capsule  3 times daily PRN        04/14/23 1418    oseltamivir (TAMIFLU) 75 MG capsule  Every 12 hours         04/14/23 1418              Peter Garter, [place] 04/15/23 0957    Anders Simmonds T, DO 04/16/23 [phone]

## 2023-04-14 NOTE — Discharge Instructions (Addendum)
As discussed, you did test positive for the flu which is most likely causing your symptoms.  Will send in cough medicine to use as needed.  You are also within the range of beginning Tamiflu this will begin this as well.  Recommend use of Tylenol for body aches/fever in addition to your pain medication.  Recommend follow-up with your primary care for reassessment.  Please do not hesitate to return to the emergency department if the worrisome signs and symptoms we discussed become apparent.

## 2023-04-15 ENCOUNTER — Emergency Department (HOSPITAL_BASED_OUTPATIENT_CLINIC_OR_DEPARTMENT_OTHER)
Admission: EM | Admit: 2023-04-15 | Discharge: 2023-04-15 | Disposition: A | Discharge status: Home / Self Care | Payer: Medicare Other

## 2023-04-15 DIAGNOSIS — J111 Influenza due to unidentified influenza virus with other respiratory manifestations: Secondary | ICD-10-CM

## 2023-04-15 DIAGNOSIS — J101 Influenza due to other identified influenza virus with other respiratory manifestations: Secondary | ICD-10-CM | POA: Diagnosis not present

## 2023-04-15 DIAGNOSIS — R110 Nausea: Secondary | ICD-10-CM | POA: Insufficient documentation

## 2023-04-15 MED ORDER — METOCLOPRAMIDE HCL 10 MG PO TABS
10.0000 mg | ORAL_TABLET | Freq: Once | ORAL | Status: AC
  Administered 2023-04-15: 10 mg via ORAL
  Filled 2023-04-15: qty 1

## 2023-04-15 MED ORDER — METOCLOPRAMIDE HCL 10 MG PO TABS: 10.0000 mg | ORAL_TABLET | Freq: Three times a day (TID) | ORAL | 0 refills | Status: AC | PRN

## 2023-04-15 NOTE — ED Triage Notes (Signed)
 States was here yesterday with flu.  Back today due to nausea. No vomiting.  States received Reglan yesterday for nausea and that helped.

## 2023-04-15 NOTE — ED Notes (Signed)
 Patient declined to get into gown.

## 2023-04-15 NOTE — ED Provider Notes (Signed)
 [facility] AT [facility] Provider Note   CSN: [id number] Arrival date & time: 04/15/23  4098     History  Chief Complaint  Patient presents with   Nausea   Influenza    Erica Richardson is a 73 y.o. female.  Presents emergency department with nausea.  Seen yesterday diagnosed with flu.  Reports Reglan in ED helped yesterday.  Tried home Zofran did not prove.  She is nauseated, but has not vomited.  Not having abdominal pain.  No new symptoms since yesterday.   Influenza      Home Medications Prior to Admission medications   Medication Sig Start Date End Date Taking? Authorizing Provider  acetaminophen (TYLENOL) 500 MG tablet Take 1,000 mg by mouth every 6 (six) hours as needed for mild pain (pain score 1-3) or moderate pain (pain score 4-6).    [provider]  albuterol (PROAIR HFA) 108 (90 Base) MCG/ACT inhaler Inhale 1 puff into the lungs every 6 (six) hours as needed for wheezing or shortness of breath. 04/17/18   [provider]  atorvastatin (LIPITOR) 10 MG tablet Take 10 mg by mouth every evening.    [provider]  baclofen (LIORESAL) 10 MG tablet Take 1 tablet (10 mg total) by mouth 3 (three) times daily. Patient taking differently: Take 5 mg by mouth at bedtime as needed for muscle spasms. 11/15/22   Juanda Chance, NP  benzonatate (TESSALON) 100 MG capsule Take 1 capsule (100 mg total) by mouth 3 (three) times daily as needed. 04/14/23   Sherian Maroon A, PA  BIOTIN PO Take 1 tablet by mouth daily.    [provider]  Buprenorphine HCl-Naloxone HCl 8-2 MG FILM Place 0.5 Film under the tongue 2 (two) times daily. 02/20/23   [provider]  citalopram (CELEXA) 40 MG tablet Take 40 mg by mouth every evening.    [provider]  denosumab (PROLIA) 60 MG/ML SOSY injection Inject 60 mg into the skin every 6 (six) months.    [provider]  diazepam (VALIUM) 5 MG tablet Take  2.5 mg by mouth daily as needed for anxiety. 01/13/16   [provider]  Ensifentrine 3 MG/2.5ML SUSP Inhale 1 ampule into the lungs in the morning and at bedtime. Patient taking differently: Inhale 1 ampule into the lungs as needed (wheezing/SOB). 01/12/23   Charlott Holler, MD  fluticasone (FLONASE) 50 MCG/ACT nasal spray Place 1 spray into both nostrils daily as needed for allergies or rhinitis.    [provider]  furosemide (LASIX) 20 MG tablet Take 10-20 mg by mouth daily as needed for fluid or edema. 12/12/22   [provider]  guaiFENesin-dextromethorphan (ROBITUSSIN DM) 100-10 MG/5ML syrup Take 10 mLs by mouth every 4 (four) hours as needed for cough. 05/31/21   Glade Lloyd, MD  HYDROcodone bit-homatropine (HYDROMET) 5-1.5 MG/5ML syrup Take 5 mLs by mouth every 6 (six) hours as needed for cough. Patient not taking: Reported on 02/25/2023 11/09/22   Virl Diamond A, MD  ipratropium-albuterol (DUONEB) 0.5-2.5 (3) MG/3ML SOLN Take 3 mLs by nebulization every 6 (six) hours as needed. 02/26/23 03/28/23  Pieter Partridge, MD  lidocaine (LIDODERM) 5 % Place 1 patch onto the skin 2 (two) times daily between meals as needed. 03/30/23   [provider]  loperamide (IMODIUM A-D) 2 MG tablet Take 2 mg by mouth daily as needed for diarrhea or loose stools.    [provider]  loratadine (CLARITIN)  10 MG tablet Take 10 mg by mouth daily.    [provider]  nicotine (NICODERM CQ - DOSED IN MG/24 HOURS) 14 mg/24hr patch Place 1 patch (14 mg total) onto the skin daily. 12/24/22   Joseph Art, DO  ondansetron (ZOFRAN-ODT) 8 MG disintegrating tablet Take 8 mg by mouth every 6 (six) hours as needed. 04/05/23   [provider]  oseltamivir (TAMIFLU) 75 MG capsule Take 1 capsule (75 mg total) by mouth every 12 (twelve) hours. 04/14/23   Sherian Maroon A, PA  oxyCODONE (OXY IR/ROXICODONE) 5 MG immediate release tablet Take 5 mg by mouth every 6  (six) hours as needed. 03/29/23   [provider]  Oxycodone HCl 10 MG TABS Take 10 mg by mouth every 6 (six) hours as needed. 04/05/23   [provider]  pramipexole (MIRAPEX) 0.125 MG tablet Take 0.25 mg by mouth at bedtime. 06/24/22   [provider]  SYMBICORT 80-4.5 MCG/ACT inhaler Inhale 2 puffs into the lungs 2 (two) times daily. 06/01/22   Bevelyn Ngo, NP  Vitamin D, Ergocalciferol, (DRISDOL) 1.25 MG (50000 UNIT) CAPS capsule Take 50,000 Units by mouth once a week. 03/29/23   [provider]      Allergies    Aleve [naproxen], Chantix [varenicline], Codeine, Ibuprofen, Meloxicam, Methocarbamol, Pravastatin sodium, Simvastatin, Sulfa antibiotics, Azithromycin, Erythromycin, and Prednisone    Review of Systems   Review of Systems  Physical Exam Updated Vital Signs BP (!) 141/69   Pulse (!) 56   Temp 98.6 F (37 C)   Resp 20   Ht 5\' 4"  (1.626 m)   Wt 61.2 kg   SpO2 94%   BMI 23.16 kg/m  Physical Exam Vitals and nursing note reviewed.  HENT:     Head: Normocephalic.     Mouth/Throat:     Mouth: Mucous membranes are moist.  Eyes:     Conjunctiva/sclera: Conjunctivae normal.  Cardiovascular:     Rate and Rhythm: Normal rate and regular rhythm.  Pulmonary:     Effort: Pulmonary effort is normal.     Breath sounds: Normal breath sounds.  Abdominal:     General: Abdomen is flat.     Palpations: Abdomen is soft.     Tenderness: There is no abdominal tenderness.  Skin:    General: Skin is warm.     Capillary Refill: Capillary refill takes less than 2 seconds.  Neurological:     Mental Status: She is alert and oriented to person, place, and time.  Psychiatric:        Mood and Affect: Mood normal.        Behavior: Behavior normal.     ED Results / Procedures / Treatments   Labs (all labs ordered are listed, but only abnormal results are displayed) Labs Reviewed - No data to display  EKG None  Radiology XR C-ARM NO REPORT Result  Date: 04/13/2023 Please see Notes tab for imaging impression.   Procedures Procedures    Medications Ordered in ED Medications  metoCLOPramide (REGLAN) tablet 10 mg (has no administration in time range)    ED Course/ Medical Decision Making/ A&P                                 Medical Decision Making 73 year old female present emergency department with nausea in the setting of influenza infection.  She is afebrile nontachycardic and stable.  Maintaining ox saturation  on room air.  Benign abdominal exam.  Had improvement yesterday with Reglan asked for the same.  Given dose here.  Will discharge with prescription as vitals and exam are reassuring.   Risk Prescription drug management.         Final Clinical Impression(s) / ED Diagnoses Final diagnoses:  None    Rx / DC Orders ED Discharge Orders     None         Coral Spikes, DO 04/15/23 1105

## 2023-04-18 ENCOUNTER — Encounter: Payer: Self-pay | Admitting: Physical Medicine and Rehabilitation

## 2023-04-18 NOTE — Procedures (Signed)
 Lumbar Facet Joint Intra-Articular Injection(s) with Fluoroscopic Guidance  Patient: Erica Richardson      Date of Birth: Sep 20, 1950 MRN: [id number] PCP: Genia Hotter, FNP      Visit Date: 04/13/2023   Universal Protocol:    Date/Time: 04/13/2023  Consent Given By: the patient  Position: PRONE   Additional Comments: Vital signs were monitored before and after the procedure. Patient was prepped and draped in the usual sterile fashion. The correct patient, procedure, and site was verified.   Injection Procedure Details:  Procedure Site One Meds Administered:  Meds ordered this encounter  Medications   methylPREDNISolone acetate (DEPO-MEDROL) injection 40 mg     Laterality: Left  Location/Site:  L3-L4 L5-S1  Needle size: 22 guage  Needle type: Spinal  Needle Placement: Articular  Findings:  -Comments: Excellent flow of contrast producing a partial arthrogram.  Procedure Details: The fluoroscope beam is vertically oriented in AP, and the inferior recess is visualized beneath the lower pole of the inferior apophyseal process, which represents the target point for needle insertion. When direct visualization is difficult the target point is located at the medial projection of the vertebral pedicle. The region overlying each aforementioned target is locally anesthetized with a 1 to 2 ml. volume of 1% Lidocaine without Epinephrine.   The spinal needle was inserted into each of the above mentioned facet joints using biplanar fluoroscopic guidance. A 0.25 to 0.5 ml. volume of Isovue-250 was injected and a partial facet joint arthrogram was obtained. A single spot film was obtained of the resulting arthrogram.    One to 1.25 ml of the steroid/anesthetic solution was then injected into each of the facet joints noted above.   Additional Comments:  No complications occurred Dressing: 2 x 2 sterile gauze and Band-Aid    Post-procedure details: Patient was observed during the  procedure. Post-procedure instructions were reviewed.  Patient left the clinic in stable condition.
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
## 2023-04-19 ENCOUNTER — Telehealth: Payer: Self-pay | Admitting: Internal Medicine

## 2023-04-19 ENCOUNTER — Emergency Department (HOSPITAL_BASED_OUTPATIENT_CLINIC_OR_DEPARTMENT_OTHER): Payer: Medicare Other | Admitting: Radiology

## 2023-04-19 ENCOUNTER — Inpatient Hospital Stay (HOSPITAL_BASED_OUTPATIENT_CLINIC_OR_DEPARTMENT_OTHER)
Admission: EM | Admit: 2023-04-19 | Discharge: 2023-04-22 | DRG: 193 | Disposition: A | Discharge status: Home / Self Care | Payer: Medicare Other | Attending: Family Medicine | Admitting: Family Medicine

## 2023-04-19 ENCOUNTER — Encounter (HOSPITAL_BASED_OUTPATIENT_CLINIC_OR_DEPARTMENT_OTHER): Payer: Self-pay

## 2023-04-19 ENCOUNTER — Other Ambulatory Visit: Payer: Self-pay

## 2023-04-19 DIAGNOSIS — Z888 Allergy status to other drugs, medicaments and biological substances status: Secondary | ICD-10-CM | POA: Diagnosis not present

## 2023-04-19 DIAGNOSIS — Z833 Family history of diabetes mellitus: Secondary | ICD-10-CM

## 2023-04-19 DIAGNOSIS — M810 Age-related osteoporosis without current pathological fracture: Secondary | ICD-10-CM | POA: Diagnosis present

## 2023-04-19 DIAGNOSIS — J441 Chronic obstructive pulmonary disease with (acute) exacerbation: Principal | ICD-10-CM | POA: Diagnosis present

## 2023-04-19 DIAGNOSIS — Z7951 Long term (current) use of inhaled steroids: Secondary | ICD-10-CM | POA: Diagnosis not present

## 2023-04-19 DIAGNOSIS — Z96641 Presence of right artificial hip joint: Secondary | ICD-10-CM | POA: Diagnosis present

## 2023-04-19 DIAGNOSIS — R918 Other nonspecific abnormal finding of lung field: Secondary | ICD-10-CM | POA: Diagnosis not present

## 2023-04-19 DIAGNOSIS — M1990 Unspecified osteoarthritis, unspecified site: Secondary | ICD-10-CM | POA: Diagnosis present

## 2023-04-19 DIAGNOSIS — F17210 Nicotine dependence, cigarettes, uncomplicated: Secondary | ICD-10-CM | POA: Diagnosis present

## 2023-04-19 DIAGNOSIS — J101 Influenza due to other identified influenza virus with other respiratory manifestations: Secondary | ICD-10-CM | POA: Diagnosis not present

## 2023-04-19 DIAGNOSIS — Z716 Tobacco abuse counseling: Secondary | ICD-10-CM | POA: Diagnosis not present

## 2023-04-19 DIAGNOSIS — Z885 Allergy status to narcotic agent status: Secondary | ICD-10-CM

## 2023-04-19 DIAGNOSIS — Z8249 Family history of ischemic heart disease and other diseases of the circulatory system: Secondary | ICD-10-CM | POA: Diagnosis not present

## 2023-04-19 DIAGNOSIS — E876 Hypokalemia: Secondary | ICD-10-CM | POA: Diagnosis not present

## 2023-04-19 DIAGNOSIS — Z882 Allergy status to sulfonamides status: Secondary | ICD-10-CM

## 2023-04-19 DIAGNOSIS — F419 Anxiety disorder, unspecified: Secondary | ICD-10-CM | POA: Diagnosis present

## 2023-04-19 DIAGNOSIS — Z79899 Other long term (current) drug therapy: Secondary | ICD-10-CM

## 2023-04-19 DIAGNOSIS — G2581 Restless legs syndrome: Secondary | ICD-10-CM | POA: Diagnosis not present

## 2023-04-19 DIAGNOSIS — R0602 Shortness of breath: Secondary | ICD-10-CM | POA: Diagnosis not present

## 2023-04-19 DIAGNOSIS — Z881 Allergy status to other antibiotic agents status: Secondary | ICD-10-CM

## 2023-04-19 DIAGNOSIS — J9601 Acute respiratory failure with hypoxia: Secondary | ICD-10-CM | POA: Diagnosis present

## 2023-04-19 DIAGNOSIS — Z1152 Encounter for screening for COVID-19: Secondary | ICD-10-CM

## 2023-04-19 DIAGNOSIS — J449 Chronic obstructive pulmonary disease, unspecified: Secondary | ICD-10-CM | POA: Insufficient documentation

## 2023-04-19 DIAGNOSIS — E785 Hyperlipidemia, unspecified: Secondary | ICD-10-CM | POA: Diagnosis present

## 2023-04-19 DIAGNOSIS — J439 Emphysema, unspecified: Secondary | ICD-10-CM | POA: Diagnosis not present

## 2023-04-19 DIAGNOSIS — Z886 Allergy status to analgesic agent status: Secondary | ICD-10-CM

## 2023-04-19 DIAGNOSIS — Z79620 Long term (current) use of immunosuppressive biologic: Secondary | ICD-10-CM | POA: Diagnosis not present

## 2023-04-19 DIAGNOSIS — F17200 Nicotine dependence, unspecified, uncomplicated: Secondary | ICD-10-CM | POA: Diagnosis present

## 2023-04-19 DIAGNOSIS — J440 Chronic obstructive pulmonary disease with acute lower respiratory infection: Secondary | ICD-10-CM | POA: Diagnosis present

## 2023-04-19 LAB — CBC WITH DIFFERENTIAL/PLATELET
Abs Immature Granulocytes: 0 10*3/uL (ref 0.00–0.07)
Basophils Absolute: 0.1 10*3/uL (ref 0.0–0.1)
Basophils Relative: 1 %
Blasts: 2 %
Eosinophils Absolute: 0 10*3/uL (ref 0.0–0.5)
Eosinophils Relative: 0 %
HCT: 40.9 % (ref 36.0–46.0)
Hemoglobin: 13.2 g/dL (ref 12.0–15.0)
Lymphocytes Relative: 24 %
Lymphs Abs: 2 10*3/uL (ref 0.7–4.0)
MCH: 31.8 pg (ref 26.0–34.0)
MCHC: 32.3 g/dL (ref 30.0–36.0)
MCV: 98.6 fL (ref 80.0–100.0)
Monocytes Absolute: 0.7 10*3/uL (ref 0.1–1.0)
Monocytes Relative: 9 %
Neutro Abs: 5.2 10*3/uL (ref 1.7–7.7)
Neutrophils Relative %: 64 %
Platelets: 242 10*3/uL (ref 150–400)
RBC Morphology: NONE SEEN
RBC: 4.15 MIL/uL (ref 3.87–5.11)
RDW: 13.8 % (ref 11.5–15.5)
WBC Morphology: ABNORMAL
WBC: 8.2 10*3/uL (ref 4.0–10.5)
nRBC: 0 % (ref 0.0–0.2)

## 2023-04-19 LAB — COMPREHENSIVE METABOLIC PANEL
ALT: 11 U/L (ref 0–44)
AST: 18 U/L (ref 15–41)
Albumin: 4.2 g/dL (ref 3.5–5.0)
Alkaline Phosphatase: 42 U/L (ref 38–126)
Anion gap: 9 (ref 5–15)
BUN: 10 mg/dL (ref 8–23)
CO2: 34 mmol/L — ABNORMAL HIGH (ref 22–32)
Calcium: 9.3 mg/dL (ref 8.9–10.3)
Chloride: 94 mmol/L — ABNORMAL LOW (ref 98–111)
Creatinine, Ser: 0.46 mg/dL (ref 0.44–1.00)
GFR, Estimated: >60 mL/min (ref >60)
Glucose, Bld: 108 mg/dL — ABNORMAL HIGH (ref 70–99)
Potassium: 2.7 mmol/L — CL (ref 3.5–5.1)
Sodium: 137 mmol/L (ref 135–145)
Total Bilirubin: 0.3 mg/dL (ref 0.0–1.2)
Total Protein: 6.7 g/dL (ref 6.5–8.1)

## 2023-04-19 LAB — I-STAT VENOUS BLOOD GAS, ED
Acid-Base Excess: 8 mmol/L — ABNORMAL HIGH (ref 0.0–2.0)
Bicarbonate: 34.7 mmol/L — ABNORMAL HIGH (ref 20.0–28.0)
Calcium, Ion: 1.17 mmol/L (ref 1.15–1.40)
HCT: 42 % (ref 36.0–46.0)
Hemoglobin: 14.3 g/dL (ref 12.0–15.0)
O2 Saturation: 41 %
Potassium: 2.7 mmol/L — CL (ref 3.5–5.1)
Sodium: 136 mmol/L (ref 135–145)
TCO2: 36 mmol/L — ABNORMAL HIGH (ref 22–32)
pCO2, Ven: 54.5 mm[Hg] (ref 44–60)
pH, Ven: 7.413 (ref 7.25–7.43)
pO2, Ven: 24 mm[Hg] — CL (ref 32–45)

## 2023-04-19 LAB — RESP PANEL BY RT-PCR (RSV, FLU A&B, COVID)  RVPGX2
Influenza A by PCR: POSITIVE — AB
Influenza B by PCR: NEGATIVE
Resp Syncytial Virus by PCR: NEGATIVE
SARS Coronavirus 2 by RT PCR: NEGATIVE

## 2023-04-19 MED ORDER — DOXYCYCLINE HYCLATE 100 MG PO TABS
100.0000 mg | ORAL_TABLET | Freq: Once | ORAL | Status: AC
  Administered 2023-04-19: 100 mg via ORAL
  Filled 2023-04-19: qty 1

## 2023-04-19 MED ORDER — POTASSIUM CHLORIDE 10 MEQ/100ML IV SOLN
10.0000 meq | INTRAVENOUS | Status: AC
  Administered 2023-04-19 (×2): 10 meq via INTRAVENOUS
  Filled 2023-04-19 (×2): qty 100

## 2023-04-19 MED ORDER — POTASSIUM CHLORIDE CRYS ER 20 MEQ PO TBCR
40.0000 meq | EXTENDED_RELEASE_TABLET | Freq: Once | ORAL | Status: AC
  Administered 2023-04-19: 40 meq via ORAL
  Filled 2023-04-19: qty 2

## 2023-04-19 MED ORDER — SODIUM CHLORIDE 0.9 % IV SOLN
1.0000 g | Freq: Once | INTRAVENOUS | Status: AC
  Administered 2023-04-19: 1 g via INTRAVENOUS
  Filled 2023-04-19: qty 10

## 2023-04-19 MED ORDER — METHYLPREDNISOLONE SODIUM SUCC 125 MG IJ SOLR
125.0000 mg | Freq: Once | INTRAMUSCULAR | Status: AC
  Administered 2023-04-19: 125 mg via INTRAVENOUS
  Filled 2023-04-19: qty 2

## 2023-04-19 MED ORDER — IPRATROPIUM-ALBUTEROL 0.5-2.5 (3) MG/3ML IN SOLN
3.0000 mL | Freq: Once | RESPIRATORY_TRACT | Status: AC
  Administered 2023-04-19: 3 mL via RESPIRATORY_TRACT
  Filled 2023-04-19: qty 3

## 2023-04-19 MED ORDER — ONDANSETRON HCL 4 MG/2ML IJ SOLN
4.0000 mg | Freq: Once | INTRAMUSCULAR | Status: AC
  Administered 2023-04-19: 4 mg via INTRAVENOUS
  Filled 2023-04-19: qty 2

## 2023-04-19 MED ORDER — OXYCODONE HCL 5 MG PO TABS: 5.0000 mg | ORAL_TABLET | Freq: Once | ORAL | Status: DC

## 2023-04-19 NOTE — ED Notes (Signed)
 Report received by previous RN. Pt resting comfortably. Has supplemental oxygen via nasal cannula. Remains on VS monitor. IV K ongoing. Call bell within reach.

## 2023-04-19 NOTE — ED Notes (Signed)
 Pt transported to imaging.

## 2023-04-19 NOTE — Telephone Encounter (Signed)
n

## 2023-04-19 NOTE — ED Triage Notes (Signed)
 In for eval of SOB. Diag with Flu on Friday with PMH of COPD. SPO2 77% RA.

## 2023-04-19 NOTE — ED Notes (Signed)
 Patient reports taking 10mg  oxycodone from home at this time. EDP notified.

## 2023-04-19 NOTE — ED Notes (Signed)
 PEAK Flow 70 before treatment PEAK Flow 70 after treatment

## 2023-04-19 NOTE — Telephone Encounter (Signed)
 Patient has the flu and is wondering how to proceed. She can be reached at [phone]

## 2023-04-19 NOTE — Telephone Encounter (Signed)
 Patient needs CT Scan to be scheduled. Please call and advise

## 2023-04-19 NOTE — Telephone Encounter (Signed)
 Lm for patient.

## 2023-04-19 NOTE — Plan of Care (Signed)
 Plan of Care Note for accepted transfer  Patient: Erica Richardson    ZOX:[id number]  DOA: 04/19/2023     Facility requesting transfer: [facility]  Requesting Provider: Franne Forts, DO  Reason for transfer: Flu A with AHRF / COPD exacerbation  Facility course:   67 F hx of COPD, tobacco abuse, anxiety, depression, arthritis, hyperlipidemia, osteoporosis, RLS, who p/w SOB. Was 77% on RA, requiring 2L O2. Flu A positive. CXR with RLL infiltrate. Treated with CTX, Doxy, steroid, nebs. Hypokalemia getting repletions   Plan of care: The patient is accepted for admission to Telemetry unit, at [facility] or [facility]    Author: Dolly Rias, MD  04/19/2023  Check [email or web address] for on-call coverage.  Nursing staff, Please call [facility] Admits & Consults System-Wide number on Amion as soon as patient's arrival, so appropriate admitting provider can evaluate the pt.

## 2023-04-19 NOTE — ED Provider Notes (Cosign Needed Addendum)
 [place] EMERGENCY DEPARTMENT AT [facility] Provider Note   CSN: [id number] Arrival date & time: 04/19/23  1720     History  Chief Complaint  Patient presents with   Shortness of Breath    Erica Richardson is a 73 y.o. female with PMHx COPD, anxiety, OA, HLD who presents to ED concerned for cough and SOB. Cough x4-5 days. SOB x2-3 days. Diagnosed with Flu 5 days ago. Denies fever.  Patient attempted to get a hold of outpatient providers for oral steroids to prevent COPD exacerbation - but was not able to.  Denies chest pain. Denies vomiting, diarrhea. Does not wear O2 at home - patient was placed on 2L [place] at triage for O2 in the 70's.    Shortness of Breath      Home Medications Prior to Admission medications   Medication Sig Start Date End Date Taking? Authorizing Provider  acetaminophen (TYLENOL) 500 MG tablet Take 1,000 mg by mouth every 6 (six) hours as needed for mild pain (pain score 1-3) or moderate pain (pain score 4-6).    [provider]  albuterol (PROAIR HFA) 108 (90 Base) MCG/ACT inhaler Inhale 1 puff into the lungs every 6 (six) hours as needed for wheezing or shortness of breath. 04/17/18   [provider]  atorvastatin (LIPITOR) 10 MG tablet Take 10 mg by mouth every evening.    [provider]  baclofen (LIORESAL) 10 MG tablet Take 1 tablet (10 mg total) by mouth 3 (three) times daily. Patient taking differently: Take 5 mg by mouth at bedtime as needed for muscle spasms. 11/15/22   Juanda Chance, NP  benzonatate (TESSALON) 100 MG capsule Take 1 capsule (100 mg total) by mouth 3 (three) times daily as needed. 04/14/23   Sherian Maroon A, PA  BIOTIN PO Take 1 tablet by mouth daily.    [provider]  Buprenorphine HCl-Naloxone HCl 8-2 MG FILM Place 0.5 Film under the tongue 2 (two) times daily. 02/20/23   [provider]  citalopram (CELEXA) 40 MG tablet Take 40 mg by mouth every evening.    [provider]  denosumab (PROLIA) 60 MG/ML SOSY injection Inject 60 mg into the skin every 6 (six) months.    [provider]  diazepam (VALIUM) 5 MG tablet Take 2.5 mg by mouth daily as needed for anxiety. 01/13/16   [provider]  Ensifentrine 3 MG/2.5ML SUSP Inhale 1 ampule into the lungs in the morning and at bedtime. Patient taking differently: Inhale 1 ampule into the lungs as needed (wheezing/SOB). 01/12/23   Charlott Holler, MD  fluticasone (FLONASE) 50 MCG/ACT nasal spray Place 1 spray into both nostrils daily as needed for allergies or rhinitis.    [provider]  furosemide (LASIX) 20 MG tablet Take 10-20 mg by mouth daily as needed for fluid or edema. 12/12/22   [provider]  guaiFENesin-dextromethorphan (ROBITUSSIN DM) 100-10 MG/5ML syrup Take 10 mLs by mouth every 4 (four) hours as needed for cough. 05/31/21   Glade Lloyd, MD  HYDROcodone bit-homatropine (HYDROMET) 5-1.5 MG/5ML syrup Take 5 mLs by mouth every 6 (six) hours as needed for cough. Patient not taking: Reported on 02/25/2023 11/09/22   Virl Diamond A, MD  ipratropium-albuterol (DUONEB) 0.5-2.5 (3) MG/3ML SOLN Take 3 mLs by nebulization every 6 (six) hours as needed. 02/26/23 03/28/23  Pieter Partridge, MD  lidocaine (LIDODERM) 5 % Place 1 patch onto the skin 2 (two) times daily between meals as  needed. 03/30/23   [provider]  loperamide (IMODIUM A-D) 2 MG tablet Take 2 mg by mouth daily as needed for diarrhea or loose stools.    [provider]  loratadine (CLARITIN) 10 MG tablet Take 10 mg by mouth daily.    [provider]  metoCLOPramide (REGLAN) 10 MG tablet Take 1 tablet (10 mg total) by mouth every 8 (eight) hours as needed for up to 3 days for nausea. 04/15/23 04/18/23  Coral Spikes, DO  nicotine (NICODERM CQ - DOSED IN MG/24 HOURS) 14 mg/24hr patch Place 1 patch (14 mg total) onto the skin daily. 12/24/22   Joseph Art, DO  ondansetron  (ZOFRAN-ODT) 8 MG disintegrating tablet Take 8 mg by mouth every 6 (six) hours as needed. 04/05/23   [provider]  oseltamivir (TAMIFLU) 75 MG capsule Take 1 capsule (75 mg total) by mouth every 12 (twelve) hours. 04/14/23   Sherian Maroon A, PA  oxyCODONE (OXY IR/ROXICODONE) 5 MG immediate release tablet Take 5 mg by mouth every 6 (six) hours as needed. 03/29/23   [provider]  Oxycodone HCl 10 MG TABS Take 10 mg by mouth every 6 (six) hours as needed. 04/05/23   [provider]  pramipexole (MIRAPEX) 0.125 MG tablet Take 0.25 mg by mouth at bedtime. 06/24/22   [provider]  SYMBICORT 80-4.5 MCG/ACT inhaler Inhale 2 puffs into the lungs 2 (two) times daily. 06/01/22   Bevelyn Ngo, NP  Vitamin D, Ergocalciferol, (DRISDOL) 1.25 MG (50000 UNIT) CAPS capsule Take 50,000 Units by mouth once a week. 03/29/23   [provider]      Allergies    Aleve [naproxen], Chantix [varenicline], Codeine, Ibuprofen, Meloxicam, Methocarbamol, Pravastatin sodium, Simvastatin, Sulfa antibiotics, Azithromycin, Erythromycin, and Prednisone    Review of Systems   Review of Systems  Respiratory:  Positive for shortness of breath.     Physical Exam Updated Vital Signs BP 133/72   Pulse 62   Temp 98.2 F (36.8 C)   Resp 18   Ht 5\' 4"  (1.626 m)   Wt 61.2 kg   SpO2 91%   BMI 23.16 kg/m  Physical Exam Vitals and nursing note reviewed.  Constitutional:      General: She is not in acute distress.    Appearance: She is not ill-appearing or toxic-appearing.  HENT:     Head: Normocephalic and atraumatic.     Mouth/Throat:     Mouth: Mucous membranes are moist.     Pharynx: No oropharyngeal exudate or posterior oropharyngeal erythema.  Eyes:     General: No scleral icterus.       Right eye: No discharge.        Left eye: No discharge.     Conjunctiva/sclera: Conjunctivae normal.  Cardiovascular:     Rate and Rhythm: Normal rate and regular rhythm.     Pulses:  Normal pulses.     Heart sounds: Normal heart sounds. No murmur heard. Pulmonary:     Effort: Pulmonary effort is normal. No respiratory distress.     Breath sounds: Examination of the left-upper field reveals decreased breath sounds. Examination of the left-middle field reveals decreased breath sounds. Examination of the left-lower field reveals decreased breath sounds. Decreased breath sounds present. No wheezing, rhonchi or rales.  Abdominal:     Tenderness: There is no abdominal tenderness.  Musculoskeletal:     Right lower leg: No edema.     Left lower leg: No edema.  Skin:  General: Skin is warm and dry.     Findings: No rash.  Neurological:     General: No focal deficit present.     Mental Status: She is alert and oriented to person, place, and time. Mental status is at baseline.  Psychiatric:        Mood and Affect: Mood normal.     ED Results / Procedures / Treatments   Labs (all labs ordered are listed, but only abnormal results are displayed) Labs Reviewed  RESP PANEL BY RT-PCR (RSV, FLU A&B, COVID)  RVPGX2 - Abnormal; Notable for the following components:      Result Value   Influenza A by PCR POSITIVE (*)    All other components within normal limits  COMPREHENSIVE METABOLIC PANEL - Abnormal; Notable for the following components:   Potassium 2.7 (*)    Chloride 94 (*)    CO2 34 (*)    Glucose, Bld 108 (*)    All other components within normal limits  I-STAT VENOUS BLOOD GAS, ED - Abnormal; Notable for the following components:   pO2, Ven 24 (*)    Bicarbonate 34.7 (*)    TCO2 36 (*)    Acid-Base Excess 8.0 (*)    Potassium 2.7 (*)    All other components within normal limits  CBC WITH DIFFERENTIAL/PLATELET  PATHOLOGIST SMEAR REVIEW    EKG None  Radiology DG Chest 2 View Result Date: 04/19/2023 CLINICAL DATA:  SOB EXAM: CHEST - 2 VIEW COMPARISON:  Chest x-ray 02/24/2023, CT chest 02/25/2023 FINDINGS: The heart and mediastinal contours are within normal  limits. Hyperinflation of the lungs. Interval development of right lower lobe airspace opacity. Chronic coarsened interstitial markings with no overt pulmonary edema. No pleural effusion. No pneumothorax. No acute osseous abnormality. IMPRESSION: 1. Interval development of right lower lobe airspace opacity. Followup PA and lateral chest X-ray is recommended in 3-4 weeks following therapy to ensure resolution. 2.  Emphysema (ICD10-J43.9). Electronically Signed   By: Tish Frederickson M.D.   On: 04/19/2023 19:13    Procedures .Critical Care  Performed by: Dorthy Cooler, PA-C Authorized by: Dorthy Cooler, PA-C   Critical care provider statement:    Critical care time (minutes):  30   Critical care was necessary to treat or prevent imminent or life-threatening deterioration of the following conditions:  Respiratory failure   Critical care was time spent personally by me on the following activities:  Development of treatment plan with patient or surrogate, discussions with consultants, evaluation of patient's response to treatment, examination of patient, ordering and review of laboratory studies, ordering and review of radiographic studies, ordering and performing treatments and interventions, pulse oximetry, re-evaluation of patient's condition and review of old charts   Care discussed with: admitting provider   Comments:     COPD exacerbation     Medications Ordered in ED Medications  potassium chloride 10 mEq in 100 mL IVPB ( Intravenous Rate/Dose Change 04/19/23 2107)  oxyCODONE (Oxy IR/ROXICODONE) immediate release tablet 5 mg (5 mg Oral Not Given 04/19/23 2058)  ondansetron (ZOFRAN) injection 4 mg (has no administration in time range)  ipratropium-albuterol (DUONEB) 0.5-2.5 (3) MG/3ML nebulizer solution 3 mL (3 mLs Nebulization Given 04/19/23 1848)  methylPREDNISolone sodium succinate (SOLU-MEDROL) 125 mg/2 mL injection 125 mg (125 mg Intravenous Given 04/19/23 1853)  potassium  chloride SA (KLOR-CON M) CR tablet 40 mEq (40 mEq Oral Given 04/19/23 1950)  cefTRIAXone (ROCEPHIN) 1 g in sodium chloride 0.9 % 100 mL IVPB (0 g  Intravenous Stopped 04/19/23 2046)  doxycycline (VIBRA-TABS) tablet 100 mg (100 mg Oral Given 04/19/23 1950)    ED Course/ Medical Decision Making/ A&P                                 Medical Decision Making Amount and/or Complexity of Data Reviewed Labs: ordered. Radiology: ordered.  Risk Prescription drug management. Decision regarding hospitalization.   This patient presents to the ED for concern of shortness of breath, this involves an extensive number of treatment options, and is a complaint that carries with it a high risk of complications and morbidity.  The differential diagnosis includes Anxiety, Anaphylaxis/Angioedema, Aspirated FB, Arrhythmia, CHF, Asthma, COPD, PNA, COVID/Flu/RSV, STEMI, Tamponade, TPNX, Sepsis   Co morbidities that complicate the patient evaluation  COPD, anxiety, OA, HLD   Additional history obtained:  Dr. Scarlett Presto PCP    Problem List / ED Course / Critical interventions / Medication management  Patient presents to ED concern for cough and SOB that has been progressing over the past week.  Was diagnosed with flu 5 days ago.   Patient hypoxic with O2 in lower 80's on RA.  Rest of physical exam and vital signs reassuring. No respiratory distress. I Ordered, and personally interpreted labs.  CBC without leukocytosis or anemia.  Respiratory panel positive for flu A. CMP showing hypokalemia 2.7. The patient was maintained on a cardiac monitor.  I personally viewed and interpreted the cardiac monitored which showed an underlying rhythm of: sinus rhythm I ordered imaging studies including chest xray to assess for process contributing to patient's symptoms. I independently visualized and interpreted imaging which showed RLL PNA. I agree with the radiologist interpretation Provided patient with breathing treatment, IV  steroids, IV ABX, and IV potassium supplementation. Dr. Lazarus Salines admitting provider. I have reviewed the patients home medicines and have made adjustments as needed   Social Determinants of Health:  None          Final Clinical Impression(s) / ED Diagnoses Final diagnoses:  Chronic obstructive pulmonary disease with acute exacerbation (HCC)  Hypokalemia    Rx / DC Orders ED Discharge Orders     None          Dorthy Cooler, [place] 04/19/23 2359    Franne Forts, DO 04/20/23 2016

## 2023-04-19 NOTE — Telephone Encounter (Signed)
 Ret missed call. PT states her 02 levels are low. Wonders if Dr. Celine Mans could send in some Pred for her. Her # is [phone]  Ilda Basset is [facility] in [place]

## 2023-04-19 NOTE — ED Notes (Signed)
 Pt requesting Tessalon pearls for cough, EDP notified and aware.

## 2023-04-20 ENCOUNTER — Encounter (HOSPITAL_COMMUNITY): Payer: Self-pay | Admitting: Internal Medicine

## 2023-04-20 ENCOUNTER — Emergency Department (HOSPITAL_BASED_OUTPATIENT_CLINIC_OR_DEPARTMENT_OTHER): Payer: Medicare Other

## 2023-04-20 DIAGNOSIS — Z886 Allergy status to analgesic agent status: Secondary | ICD-10-CM | POA: Diagnosis not present

## 2023-04-20 DIAGNOSIS — R0602 Shortness of breath: Secondary | ICD-10-CM | POA: Diagnosis not present

## 2023-04-20 DIAGNOSIS — Z881 Allergy status to other antibiotic agents status: Secondary | ICD-10-CM | POA: Diagnosis not present

## 2023-04-20 DIAGNOSIS — J449 Chronic obstructive pulmonary disease, unspecified: Secondary | ICD-10-CM | POA: Diagnosis not present

## 2023-04-20 DIAGNOSIS — J9601 Acute respiratory failure with hypoxia: Secondary | ICD-10-CM | POA: Diagnosis not present

## 2023-04-20 DIAGNOSIS — M1990 Unspecified osteoarthritis, unspecified site: Secondary | ICD-10-CM | POA: Diagnosis not present

## 2023-04-20 DIAGNOSIS — R918 Other nonspecific abnormal finding of lung field: Secondary | ICD-10-CM | POA: Diagnosis not present

## 2023-04-20 DIAGNOSIS — Z885 Allergy status to narcotic agent status: Secondary | ICD-10-CM | POA: Diagnosis not present

## 2023-04-20 DIAGNOSIS — Z79899 Other long term (current) drug therapy: Secondary | ICD-10-CM | POA: Diagnosis not present

## 2023-04-20 DIAGNOSIS — J101 Influenza due to other identified influenza virus with other respiratory manifestations: Secondary | ICD-10-CM

## 2023-04-20 DIAGNOSIS — Z1152 Encounter for screening for COVID-19: Secondary | ICD-10-CM | POA: Diagnosis not present

## 2023-04-20 DIAGNOSIS — Z716 Tobacco abuse counseling: Secondary | ICD-10-CM | POA: Diagnosis not present

## 2023-04-20 DIAGNOSIS — E876 Hypokalemia: Secondary | ICD-10-CM | POA: Diagnosis not present

## 2023-04-20 DIAGNOSIS — J440 Chronic obstructive pulmonary disease with acute lower respiratory infection: Secondary | ICD-10-CM | POA: Diagnosis not present

## 2023-04-20 DIAGNOSIS — Z8249 Family history of ischemic heart disease and other diseases of the circulatory system: Secondary | ICD-10-CM | POA: Diagnosis not present

## 2023-04-20 DIAGNOSIS — E785 Hyperlipidemia, unspecified: Secondary | ICD-10-CM | POA: Diagnosis not present

## 2023-04-20 DIAGNOSIS — Z7951 Long term (current) use of inhaled steroids: Secondary | ICD-10-CM | POA: Diagnosis not present

## 2023-04-20 DIAGNOSIS — Z833 Family history of diabetes mellitus: Secondary | ICD-10-CM | POA: Diagnosis not present

## 2023-04-20 DIAGNOSIS — F419 Anxiety disorder, unspecified: Secondary | ICD-10-CM | POA: Diagnosis not present

## 2023-04-20 DIAGNOSIS — M810 Age-related osteoporosis without current pathological fracture: Secondary | ICD-10-CM | POA: Diagnosis not present

## 2023-04-20 DIAGNOSIS — G2581 Restless legs syndrome: Secondary | ICD-10-CM | POA: Diagnosis not present

## 2023-04-20 DIAGNOSIS — J441 Chronic obstructive pulmonary disease with (acute) exacerbation: Secondary | ICD-10-CM | POA: Diagnosis not present

## 2023-04-20 DIAGNOSIS — Z888 Allergy status to other drugs, medicaments and biological substances status: Secondary | ICD-10-CM | POA: Diagnosis not present

## 2023-04-20 DIAGNOSIS — F17210 Nicotine dependence, cigarettes, uncomplicated: Secondary | ICD-10-CM | POA: Diagnosis not present

## 2023-04-20 DIAGNOSIS — Z79620 Long term (current) use of immunosuppressive biologic: Secondary | ICD-10-CM | POA: Diagnosis not present

## 2023-04-20 DIAGNOSIS — Z882 Allergy status to sulfonamides status: Secondary | ICD-10-CM | POA: Diagnosis not present

## 2023-04-20 DIAGNOSIS — Z96641 Presence of right artificial hip joint: Secondary | ICD-10-CM | POA: Diagnosis not present

## 2023-04-20 LAB — I-STAT VENOUS BLOOD GAS, ED
Acid-Base Excess: 9 mmol/L — ABNORMAL HIGH (ref 0.0–2.0)
Bicarbonate: 34.7 mmol/L — ABNORMAL HIGH (ref 20.0–28.0)
Calcium, Ion: 1.15 mmol/L (ref 1.15–1.40)
HCT: 40 % (ref 36.0–46.0)
Hemoglobin: 13.6 g/dL (ref 12.0–15.0)
O2 Saturation: 61 %
Patient temperature: 98.1
Potassium: 4.1 mmol/L (ref 3.5–5.1)
Sodium: 135 mmol/L (ref 135–145)
TCO2: 36 mmol/L — ABNORMAL HIGH (ref 22–32)
pCO2, Ven: 48.2 mm[Hg] (ref 44–60)
pH, Ven: 7.464 — ABNORMAL HIGH (ref 7.25–7.43)
pO2, Ven: 30 mm[Hg] — CL (ref 32–45)

## 2023-04-20 LAB — PATHOLOGIST SMEAR REVIEW

## 2023-04-20 LAB — BASIC METABOLIC PANEL
Anion gap: 9 (ref 5–15)
BUN: 7 mg/dL — ABNORMAL LOW (ref 8–23)
CO2: 30 mmol/L (ref 22–32)
Calcium: 8.7 mg/dL — ABNORMAL LOW (ref 8.9–10.3)
Chloride: 98 mmol/L (ref 98–111)
Creatinine, Ser: 0.32 mg/dL — ABNORMAL LOW (ref 0.44–1.00)
GFR, Estimated: >60 mL/min (ref >60)
Glucose, Bld: 119 mg/dL — ABNORMAL HIGH (ref 70–99)
Potassium: 3.8 mmol/L (ref 3.5–5.1)
Sodium: 137 mmol/L (ref 135–145)

## 2023-04-20 LAB — PROCALCITONIN: Procalcitonin: <0.1 ng/mL

## 2023-04-20 MED ORDER — POTASSIUM CHLORIDE CRYS ER 20 MEQ PO TBCR
40.0000 meq | EXTENDED_RELEASE_TABLET | Freq: Once | ORAL | Status: AC
  Administered 2023-04-20: 40 meq via ORAL
  Filled 2023-04-20: qty 2

## 2023-04-20 MED ORDER — CITALOPRAM HYDROBROMIDE 20 MG PO TABS
40.0000 mg | ORAL_TABLET | Freq: Every evening | ORAL | Status: DC
  Administered 2023-04-20 – 2023-04-21 (×2): 40 mg via ORAL
  Filled 2023-04-20 (×2): qty 2

## 2023-04-20 MED ORDER — ACETAMINOPHEN 650 MG RE SUPP: 650.0000 mg | Freq: Four times a day (QID) | RECTAL | Status: DC | PRN

## 2023-04-20 MED ORDER — DOXYCYCLINE HYCLATE 100 MG PO TABS
100.0000 mg | ORAL_TABLET | Freq: Two times a day (BID) | ORAL | Status: DC
  Administered 2023-04-20 – 2023-04-22 (×5): 100 mg via ORAL
  Filled 2023-04-20 (×6): qty 1

## 2023-04-20 MED ORDER — BENZONATATE 100 MG PO CAPS
200.0000 mg | ORAL_CAPSULE | Freq: Three times a day (TID) | ORAL | Status: DC | PRN
  Administered 2023-04-20 – 2023-04-22 (×5): 200 mg via ORAL
  Filled 2023-04-20 (×6): qty 2

## 2023-04-20 MED ORDER — HYDROCOD POLI-CHLORPHE POLI ER 10-8 MG/5ML PO SUER
5.0000 mL | Freq: Two times a day (BID) | ORAL | Status: DC | PRN
  Administered 2023-04-22: 5 mL via ORAL
  Filled 2023-04-20 (×3): qty 5

## 2023-04-20 MED ORDER — ACETAMINOPHEN 500 MG PO TABS
1000.0000 mg | ORAL_TABLET | Freq: Four times a day (QID) | ORAL | Status: DC | PRN
  Administered 2023-04-21 – 2023-04-22 (×3): 1000 mg via ORAL
  Filled 2023-04-20 (×3): qty 2

## 2023-04-20 MED ORDER — BISACODYL 5 MG PO TBEC
10.0000 mg | DELAYED_RELEASE_TABLET | Freq: Every day | ORAL | Status: DC | PRN
  Administered 2023-04-20: 10 mg via ORAL
  Filled 2023-04-20: qty 2

## 2023-04-20 MED ORDER — ONDANSETRON HCL 4 MG/2ML IJ SOLN
4.0000 mg | Freq: Once | INTRAMUSCULAR | Status: AC
  Administered 2023-04-20: 4 mg via INTRAVENOUS
  Filled 2023-04-20: qty 2

## 2023-04-20 MED ORDER — IPRATROPIUM-ALBUTEROL 0.5-2.5 (3) MG/3ML IN SOLN
3.0000 mL | RESPIRATORY_TRACT | Status: DC | PRN
  Administered 2023-04-20 (×2): 3 mL via RESPIRATORY_TRACT
  Filled 2023-04-20 (×2): qty 3

## 2023-04-20 MED ORDER — HYDRALAZINE HCL 20 MG/ML IJ SOLN: 10.0000 mg | Freq: Four times a day (QID) | INTRAMUSCULAR | Status: DC | PRN

## 2023-04-20 MED ORDER — SODIUM CHLORIDE 0.9 % IV SOLN: 250.0000 mL | INTRAVENOUS | Status: AC | PRN

## 2023-04-20 MED ORDER — MOMETASONE FURO-FORMOTEROL FUM 100-5 MCG/ACT IN AERO
2.0000 | INHALATION_SPRAY | Freq: Two times a day (BID) | RESPIRATORY_TRACT | Status: DC
  Administered 2023-04-20 – 2023-04-22 (×4): 2 via RESPIRATORY_TRACT
  Filled 2023-04-20: qty 8.8

## 2023-04-20 MED ORDER — NICOTINE 14 MG/24HR TD PT24
14.0000 mg | MEDICATED_PATCH | Freq: Every day | TRANSDERMAL | Status: DC
  Administered 2023-04-20 – 2023-04-22 (×3): 14 mg via TRANSDERMAL
  Filled 2023-04-20 (×3): qty 1

## 2023-04-20 MED ORDER — SODIUM CHLORIDE 0.9% FLUSH
3.0000 mL | Freq: Two times a day (BID) | INTRAVENOUS | Status: DC
  Administered 2023-04-20 – 2023-04-22 (×4): 3 mL via INTRAVENOUS

## 2023-04-20 MED ORDER — PRAMIPEXOLE DIHYDROCHLORIDE 0.25 MG PO TABS
0.2500 mg | ORAL_TABLET | Freq: Every day | ORAL | Status: DC
  Administered 2023-04-20 – 2023-04-21 (×2): 0.25 mg via ORAL
  Filled 2023-04-20 (×3): qty 1

## 2023-04-20 MED ORDER — METHYLPREDNISOLONE SODIUM SUCC 125 MG IJ SOLR
125.0000 mg | Freq: Three times a day (TID) | INTRAMUSCULAR | Status: DC
  Administered 2023-04-20: 125 mg via INTRAVENOUS
  Filled 2023-04-20: qty 2

## 2023-04-20 MED ORDER — SODIUM CHLORIDE 0.9 % IV SOLN
1.0000 g | INTRAVENOUS | Status: DC
  Administered 2023-04-20 – 2023-04-21 (×2): 1 g via INTRAVENOUS
  Filled 2023-04-20 (×2): qty 10

## 2023-04-20 MED ORDER — IPRATROPIUM-ALBUTEROL 0.5-2.5 (3) MG/3ML IN SOLN
3.0000 mL | Freq: Four times a day (QID) | RESPIRATORY_TRACT | Status: DC
  Administered 2023-04-20: 3 mL via RESPIRATORY_TRACT
  Filled 2023-04-20: qty 3

## 2023-04-20 MED ORDER — BACLOFEN 10 MG PO TABS: 10.0000 mg | ORAL_TABLET | Freq: Three times a day (TID) | ORAL | Status: DC

## 2023-04-20 MED ORDER — POLYETHYLENE GLYCOL 3350 17 G PO PACK
17.0000 g | PACK | Freq: Every day | ORAL | Status: DC | PRN
  Administered 2023-04-20: 17 g via ORAL
  Filled 2023-04-20 (×2): qty 1

## 2023-04-20 MED ORDER — HYDROCODONE BIT-HOMATROP MBR 5-1.5 MG/5ML PO SOLN: 5.0000 mL | ORAL | Status: DC | PRN

## 2023-04-20 MED ORDER — ENOXAPARIN SODIUM 40 MG/0.4ML IJ SOSY
40.0000 mg | PREFILLED_SYRINGE | INTRAMUSCULAR | Status: DC
  Administered 2023-04-20 – 2023-04-21 (×2): 40 mg via SUBCUTANEOUS
  Filled 2023-04-20 (×2): qty 0.4

## 2023-04-20 MED ORDER — ENOXAPARIN SODIUM 40 MG/0.4ML IJ SOSY: 40.0000 mg | PREFILLED_SYRINGE | INTRAMUSCULAR | Status: DC

## 2023-04-20 MED ORDER — OXYCODONE HCL 5 MG PO TABS
10.0000 mg | ORAL_TABLET | Freq: Four times a day (QID) | ORAL | Status: DC | PRN
  Administered 2023-04-20 – 2023-04-22 (×7): 10 mg via ORAL
  Filled 2023-04-20 (×7): qty 2

## 2023-04-20 MED ORDER — ONDANSETRON 4 MG PO TBDP
4.0000 mg | ORAL_TABLET | Freq: Four times a day (QID) | ORAL | Status: DC | PRN
  Administered 2023-04-20: 4 mg via ORAL
  Filled 2023-04-20: qty 1

## 2023-04-20 MED ORDER — FLUTICASONE PROPIONATE 50 MCG/ACT NA SUSP
1.0000 | Freq: Every day | NASAL | Status: DC | PRN
  Administered 2023-04-21: 1 via NASAL
  Filled 2023-04-20: qty 16

## 2023-04-20 MED ORDER — SODIUM CHLORIDE 0.9% FLUSH: 3.0000 mL | INTRAVENOUS | Status: DC | PRN

## 2023-04-20 MED ORDER — IPRATROPIUM-ALBUTEROL 0.5-2.5 (3) MG/3ML IN SOLN
3.0000 mL | Freq: Three times a day (TID) | RESPIRATORY_TRACT | Status: DC
  Administered 2023-04-21 – 2023-04-22 (×4): 3 mL via RESPIRATORY_TRACT
  Filled 2023-04-20 (×4): qty 3

## 2023-04-20 MED ORDER — ACETAMINOPHEN 325 MG PO TABS: 650.0000 mg | ORAL_TABLET | Freq: Four times a day (QID) | ORAL | Status: DC | PRN

## 2023-04-20 MED ORDER — ONDANSETRON 4 MG PO TBDP
8.0000 mg | ORAL_TABLET | Freq: Four times a day (QID) | ORAL | Status: DC | PRN
  Administered 2023-04-21 (×2): 8 mg via ORAL
  Filled 2023-04-20 (×2): qty 2

## 2023-04-20 MED ORDER — ATORVASTATIN CALCIUM 10 MG PO TABS
10.0000 mg | ORAL_TABLET | Freq: Every evening | ORAL | Status: DC
  Administered 2023-04-20 – 2023-04-21 (×2): 10 mg via ORAL
  Filled 2023-04-20 (×2): qty 1

## 2023-04-20 MED ORDER — METHYLPREDNISOLONE SODIUM SUCC 40 MG IJ SOLR
40.0000 mg | Freq: Three times a day (TID) | INTRAMUSCULAR | Status: DC
  Administered 2023-04-20 – 2023-04-21 (×2): 40 mg via INTRAVENOUS
  Filled 2023-04-20 (×2): qty 1

## 2023-04-20 NOTE — Progress Notes (Signed)
 PHARMACY ROUNDING NOTE  Erica Richardson is a 73 y.o. female awaiting admission. A chart review was completed to evaluate prior to admission medications, antibiotic therapy and labs/vitals. The following interventions were made:  Recheck K>>normalized, no further treatment needed Continue antibiotics  This was discussed with the ED or admitting provider and/or nurse/paramedic.   Lysle Pearl, PharmD, BCPS, BCEMP Clinical Pharmacist Please see AMION for all pharmacy numbers 04/20/2023 7:50 AM

## 2023-04-20 NOTE — Plan of Care (Signed)
  Problem: Education: Goal: Knowledge of General Education information will improve Description: Including pain rating scale, medication(s)/side effects and non-pharmacologic comfort measures Outcome: Progressing   Problem: Health Behavior/Discharge Planning: Goal: Ability to manage health-related needs will improve Outcome: Progressing   Problem: Clinical Measurements: Goal: Ability to maintain clinical measurements within normal limits will improve Outcome: Progressing Goal: Will remain free from infection Outcome: Progressing Goal: Diagnostic test results will improve Outcome: Progressing Goal: Respiratory complications will improve Outcome: Progressing   Problem: Activity: Goal: Risk for activity intolerance will decrease Outcome: Progressing   Problem: Nutrition: Goal: Adequate nutrition will be maintained Outcome: Progressing   Problem: Coping: Goal: Level of anxiety will decrease Outcome: Progressing   Problem: Elimination: Goal: Will not experience complications related to bowel motility Outcome: Progressing   Problem: Pain Managment: Goal: General experience of comfort will improve and/or be controlled Outcome: Progressing   Problem: Safety: Goal: Ability to remain free from injury will improve Outcome: Progressing   Problem: Education: Goal: Knowledge of disease or condition will improve Outcome: Progressing   Problem: Activity: Goal: Will verbalize the importance of balancing activity with adequate rest periods Outcome: Progressing   Problem: Respiratory: Goal: Ability to maintain a clear airway will improve Outcome: Progressing Goal: Levels of oxygenation will improve Outcome: Progressing

## 2023-04-20 NOTE — Telephone Encounter (Signed)
 Lm for patient.  Mychart message sent  Patient seen at ED 2/26.
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
## 2023-04-20 NOTE — ED Notes (Signed)
 Assisted pt to and from the bathroom via wheelchair. Pt ambulatory with stand by assist. Assisted back to bed. Ice water provided. Denies any additional needs

## 2023-04-20 NOTE — Telephone Encounter (Signed)
Please see duplicate phone note.  

## 2023-04-20 NOTE — Hospital Course (Signed)
 Patient is a 73 years old female with past medical history of COPD, anxiety, hyperlipidemia, osteoarthritis presented to hospital with increasing cough and shortness of breath for 4 to 5 days with increased shortness of breath for 2 to 3 days.  Of note patient was diagnosed teflaro 5 days back and was trying to get in touch with her providers for steroids which she could not do.  In the ED patient denies any chest pain, nausea vomiting fever chills or rigor.  At baseline patient does not wear oxygen at home but was noted to be hypoxic with pulse ox of 70% and room air in the ED so was placed on 2 L of oxygen and was considered for admission to the hospital.  Patient initially presented to [facility] ED.  She was tachypneic but afebrile.  Labs showed hypokalemia with potassium of 2.7.  Influenza was positive.  Chest x-ray done in the ED showed right lower lobe opacity.  Patient received potassium IV, oxycodone, DuoNebs, Solu-Medrol, Rocephin and doxycycline IV and was considered for admission to the hospital for further evaluation and treatment.  Assessment and plan.  Influenza A infection with COPD exacerbation.  Continue with oxygen nebulizer steroids and supportive care.  Out of the window for Tamiflu.  No leukocytosis at this time.  Temperature max was 98.3 degree over the last 24 hours.  Significant hypokalemia.  Received IV potassium supplements.  Potassium has improved to 3.8 at this time.  Will continue to monitor.  Acute hypoxic respiratory failure secondary to right lower lobe pneumonia.  Will get pulm procalcitonin.  Patient received Rocephin and doxycycline in the ED will continue with that.    COPD exacerbation.  Continue with oxygen nebulizer steroids.  History of anxiety Hyperlipidemia Osteoarthritis

## 2023-04-20 NOTE — ED Notes (Signed)
 Dr Donnald Garre said that the Pt's SATS can be 88% or greater

## 2023-04-20 NOTE — ED Notes (Signed)
 Pt reports taking 10mg  Oxycodone from home medications at 1000 today.

## 2023-04-20 NOTE — ED Provider Notes (Addendum)
 Patient is having increasing coughing this morning. Physical Exam  BP (!) 142/77   Pulse (!) 48   Temp 98.3 F (36.8 C) (Oral)   Resp (!) 25   Ht 5\' 4"  (1.626 m)   Wt 61.2 kg   SpO2 93%   BMI 23.16 kg/m   Physical Exam  Procedures  Procedures  ED Course / MDM    Medical Decision Making Amount and/or Complexity of Data Reviewed Labs: ordered. Radiology: ordered.  Risk Prescription drug management. Decision regarding hospitalization.   I have reviewed medications.  Patient still awaiting bed placement.  I have ordered doxycycline twice daily and Rocephin once daily per admission planning.  DuoNebs ordered as needed and [employer] and Hycodan ordered as needed.  I have ordered Solu-Medrol to continue for additional doses for COPD exacerbation.  Patient is alert.  Oxygen saturation is 90% on 2 L.  Blood pressures are stable.  She does have frequent cough.  Breath sounds are soft.  She is not in acute respiratory distress.  Will continue ongoing treatment and await bed placement for admission.  12: 11 patient required some additional oxygen but is tapered back down to 3 L.  She is on nasal cannula in the stretcher.  Patient is resting but she awakens to light voice.  Is she is situationally appropriate and aware.  No confusion.  Mild increased work of breathing but not severe fatigue.  Breath sounds are audible to the bases.  Some expiratory wheeze and tight cough but adequate airflow.  Patient has not deteriorated since exam earlier today.  Symptomatically, the patient feels about the same as earlier today.  She continues to feel fatigued and nauseated.     Arby Barrette, MD 04/20/23 1040    Arby Barrette, MD 04/20/23 1213

## 2023-04-20 NOTE — ED Notes (Signed)
Report given to oncoming RN Heather.

## 2023-04-20 NOTE — Telephone Encounter (Signed)
 This is for a lung cancer screening Ct I will call the patient and schedule it.   Spoke to patient she is aware of the appt

## 2023-04-21 DIAGNOSIS — M810 Age-related osteoporosis without current pathological fracture: Secondary | ICD-10-CM | POA: Diagnosis present

## 2023-04-21 DIAGNOSIS — J441 Chronic obstructive pulmonary disease with (acute) exacerbation: Secondary | ICD-10-CM | POA: Diagnosis present

## 2023-04-21 DIAGNOSIS — Z833 Family history of diabetes mellitus: Secondary | ICD-10-CM | POA: Diagnosis not present

## 2023-04-21 DIAGNOSIS — Z79620 Long term (current) use of immunosuppressive biologic: Secondary | ICD-10-CM | POA: Diagnosis not present

## 2023-04-21 DIAGNOSIS — J101 Influenza due to other identified influenza virus with other respiratory manifestations: Secondary | ICD-10-CM | POA: Diagnosis present

## 2023-04-21 DIAGNOSIS — J9601 Acute respiratory failure with hypoxia: Secondary | ICD-10-CM | POA: Diagnosis present

## 2023-04-21 DIAGNOSIS — F17210 Nicotine dependence, cigarettes, uncomplicated: Secondary | ICD-10-CM | POA: Diagnosis present

## 2023-04-21 DIAGNOSIS — E876 Hypokalemia: Secondary | ICD-10-CM | POA: Diagnosis present

## 2023-04-21 DIAGNOSIS — J440 Chronic obstructive pulmonary disease with acute lower respiratory infection: Secondary | ICD-10-CM | POA: Diagnosis present

## 2023-04-21 DIAGNOSIS — Z96641 Presence of right artificial hip joint: Secondary | ICD-10-CM | POA: Diagnosis present

## 2023-04-21 DIAGNOSIS — Z716 Tobacco abuse counseling: Secondary | ICD-10-CM | POA: Diagnosis not present

## 2023-04-21 DIAGNOSIS — Z886 Allergy status to analgesic agent status: Secondary | ICD-10-CM | POA: Diagnosis not present

## 2023-04-21 DIAGNOSIS — M1990 Unspecified osteoarthritis, unspecified site: Secondary | ICD-10-CM | POA: Diagnosis present

## 2023-04-21 DIAGNOSIS — G2581 Restless legs syndrome: Secondary | ICD-10-CM | POA: Diagnosis present

## 2023-04-21 DIAGNOSIS — Z885 Allergy status to narcotic agent status: Secondary | ICD-10-CM | POA: Diagnosis not present

## 2023-04-21 DIAGNOSIS — Z8249 Family history of ischemic heart disease and other diseases of the circulatory system: Secondary | ICD-10-CM | POA: Diagnosis not present

## 2023-04-21 DIAGNOSIS — F419 Anxiety disorder, unspecified: Secondary | ICD-10-CM | POA: Diagnosis present

## 2023-04-21 DIAGNOSIS — Z1152 Encounter for screening for COVID-19: Secondary | ICD-10-CM | POA: Diagnosis not present

## 2023-04-21 DIAGNOSIS — Z881 Allergy status to other antibiotic agents status: Secondary | ICD-10-CM | POA: Diagnosis not present

## 2023-04-21 DIAGNOSIS — Z882 Allergy status to sulfonamides status: Secondary | ICD-10-CM | POA: Diagnosis not present

## 2023-04-21 DIAGNOSIS — E785 Hyperlipidemia, unspecified: Secondary | ICD-10-CM | POA: Diagnosis present

## 2023-04-21 DIAGNOSIS — Z888 Allergy status to other drugs, medicaments and biological substances status: Secondary | ICD-10-CM | POA: Diagnosis not present

## 2023-04-21 DIAGNOSIS — Z79899 Other long term (current) drug therapy: Secondary | ICD-10-CM | POA: Diagnosis not present

## 2023-04-21 DIAGNOSIS — Z7951 Long term (current) use of inhaled steroids: Secondary | ICD-10-CM | POA: Diagnosis not present

## 2023-04-21 LAB — HEMOGLOBIN A1C
Hgb A1c MFr Bld: 5.8 % — ABNORMAL HIGH (ref 4.8–5.6)
Mean Plasma Glucose: 119.76 mg/dL

## 2023-04-21 LAB — MAGNESIUM: Magnesium: 2.1 mg/dL (ref 1.7–2.4)

## 2023-04-21 LAB — BASIC METABOLIC PANEL
Anion gap: 11 (ref 5–15)
BUN: 11 mg/dL (ref 8–23)
CO2: 28 mmol/L (ref 22–32)
Calcium: 8.7 mg/dL — ABNORMAL LOW (ref 8.9–10.3)
Chloride: 97 mmol/L — ABNORMAL LOW (ref 98–111)
Creatinine, Ser: 0.36 mg/dL — ABNORMAL LOW (ref 0.44–1.00)
GFR, Estimated: >60 mL/min (ref >60)
Glucose, Bld: 169 mg/dL — ABNORMAL HIGH (ref 70–99)
Potassium: 4.7 mmol/L (ref 3.5–5.1)
Sodium: 136 mmol/L (ref 135–145)

## 2023-04-21 LAB — CBC
HCT: 39 % (ref 36.0–46.0)
Hemoglobin: 12.3 g/dL (ref 12.0–15.0)
MCH: 31.6 pg (ref 26.0–34.0)
MCHC: 31.5 g/dL (ref 30.0–36.0)
MCV: 100.3 fL — ABNORMAL HIGH (ref 80.0–100.0)
Platelets: 261 10*3/uL (ref 150–400)
RBC: 3.89 MIL/uL (ref 3.87–5.11)
RDW: 13.8 % (ref 11.5–15.5)
WBC: 5.2 10*3/uL (ref 4.0–10.5)
nRBC: 0 % (ref 0.0–0.2)

## 2023-04-21 LAB — LIPID PANEL
Cholesterol: 126 mg/dL (ref 0–200)
HDL: 62 mg/dL (ref >40)
LDL Cholesterol: 45 mg/dL (ref 0–99)
Total CHOL/HDL Ratio: 2 {ratio}
Triglycerides: 96 mg/dL (ref <150)
VLDL: 19 mg/dL (ref 0–40)

## 2023-04-21 LAB — PHOSPHORUS: Phosphorus: 2.8 mg/dL (ref 2.5–4.6)

## 2023-04-21 MED ORDER — METHYLPREDNISOLONE SODIUM SUCC 40 MG IJ SOLR
40.0000 mg | Freq: Two times a day (BID) | INTRAMUSCULAR | Status: DC
  Administered 2023-04-21 – 2023-04-22 (×2): 40 mg via INTRAVENOUS
  Filled 2023-04-21 (×2): qty 1

## 2023-04-21 NOTE — Progress Notes (Signed)
   04/21/23 1337  TOC Brief Assessment  Insurance and Status Reviewed  Patient has primary care physician Yes  Home environment has been reviewed Home w/ spouse  Prior level of function: Independent  Prior/Current Home Services No current home services  Social Drivers of Health Review SDOH reviewed no interventions necessary  Readmission risk has been reviewed Yes  Transition of care needs transition of care needs identified, TOC will continue to follow   TOC will follow for possible O2 requirement at discharge.

## 2023-04-21 NOTE — Progress Notes (Signed)
 Mobility Specialist - Progress Note   04/21/23 1352  Oxygen Therapy  SpO2 91 %  O2 Device Nasal Cannula  O2 Flow Rate (L/min) 3 L/min  Patient Activity (if Appropriate) Ambulating  Mobility  Activity Ambulated independently in hallway  Level of Assistance Independent  Assistive Device None  Distance Ambulated (ft) 250 ft  Activity Response Tolerated well  Mobility Referral Yes  Mobility visit 1 Mobility  Mobility Specialist Start Time (ACUTE ONLY) 1340  Mobility Specialist Stop Time (ACUTE ONLY) 1352  Mobility Specialist Time Calculation (min) (ACUTE ONLY) 12 min   Pt received in bed and agreeable to mobility. No complaints during session. Pt to EOB after session with all needs met.    Pre-mobility: 87 HR, 92% SpO2 (3L [place]) During mobility: 101 HR, 91% SpO2 (3L [place]) Post-mobility: 101 HR, 91% SPO2 (3L [place])  Chief Technology Officer

## 2023-04-21 NOTE — Plan of Care (Signed)

## 2023-04-21 NOTE — Progress Notes (Addendum)
 PROGRESS NOTE    Erica Richardson  ZOX:[id number] DOB: 1951-01-11 DOA: 04/19/2023 PCP: Genia Hotter, FNP   Brief Narrative:   Patient is a 73 years old female with past medical history of COPD, anxiety, hyperlipidemia, osteoarthritis presented to hospital with increasing cough and shortness of breath for 4 to 5 days mostly over 2 days.  Patient denies any chest pain, fever chills or rigor. No mention of  dizziness, lightheadedness or syncope.   Denies any urinary urgency, frequency or dysuria.  Denies any abdominal pain or diarrhea but with nausea and decreased appetite for the last few days.  At baseline , patient does not wear oxygen at home but was noted to be hypoxic with pulse ox of 70% and room air in the ED so was placed on 2 L of oxygen. She was tachypneic but afebrile.  Labs showed hypokalemia with potassium of 2.7.  Influenza was positive.  Chest x-ray done in the ED showed right lower lobe opacity.  Patient received potassium IV, oxycodone, DuoNebs, Solu-Medrol, Rocephin and doxycycline IV and was considered for admission to the hospital for further evaluation and treatment.   Assessment & Plan:   Principal Problem:   Influenza A Active Problems:   Smoker   COPD (chronic obstructive pulmonary disease) (HCC)  Acute hypoxic respiratory failure secondary to acute COPD exacerbation and influenza A infection, POA, community-acquired pneumonia ruled out: Patient requiring 3 L of oxygen to maintain oxygen saturation over 90%.  Tested positive for influenza A in the ED.  Chest x-ray indicated of possible right lower lobe infiltrate however procalcitonin negative, patient afebrile, no leukocytosis, clinically speaking, I do not think she has bacterial pneumonia.  Will discontinue Rocephin however will continue doxycycline for COPD exacerbation.  Still wheezy, will continue Solu-Medrol but reduce frequency to every 12 hours.  Continue bronchodilators/Dulera.  Encouraged incentive spirometry.   Will wean oxygen as able to.  Hypokalemia: Resolved.  History of anxiety: On Celexa.  History of nicotine use disorder: She states that she quitted 2 weeks back.  She is on nicotine patch.  I have discussed tobacco cessation with the patient.  I have counseled the patient regarding the negative impacts of continued tobacco use including but not limited to lung cancer, COPD, and cardiovascular disease.  I have discussed alternatives to tobacco and modalities that may help facilitate tobacco cessation including but not limited to biofeedback, hypnosis, and medications.  Total time spent with tobacco counseling was 5 minutes.  Hyperlipidemia: Continue statin.   DVT prophylaxis: enoxaparin (LOVENOX) injection 40 mg Start: 04/20/23 2200   Code Status: Full Code  Family Communication:  None present at bedside.  Plan of care discussed with patient in length and he/she verbalized understanding and agreed with it.  Status is: Observation The patient will require care spanning > 2 midnights and should be moved to inpatient because: Needs another day of hospitalization as she is still symptomatic.   Estimated body mass index is 23.16 kg/m as calculated from the following:   Height as of this encounter: 5\' 4"  (1.626 m).   Weight as of this encounter: 61.2 kg.    Nutritional Assessment: Body mass index is 23.16 kg/m.Marland Kitchen Seen by dietician.  I agree with the assessment and plan as outlined below: Nutrition Status:        . Skin Assessment: I have examined the patient's skin and I agree with the wound assessment as performed by the wound care RN as outlined below:    Consultants:  None  Procedures:  None  Antimicrobials:  Anti-infectives (From admission, onward)    Start     Dose/Rate Route Frequency Ordered Stop   04/20/23 2000  cefTRIAXone (ROCEPHIN) 1 g in sodium chloride 0.9 % 100 mL IVPB        1 g 200 mL/hr over 30 Minutes Intravenous Every 24 hours 04/20/23 1038     04/20/23  1045  doxycycline (VIBRA-TABS) tablet 100 mg        100 mg Oral Every 12 hours 04/20/23 1038 04/27/23 0959   04/19/23 1945  cefTRIAXone (ROCEPHIN) 1 g in sodium chloride 0.9 % 100 mL IVPB        1 g 200 mL/hr over 30 Minutes Intravenous  Once 04/19/23 1931 04/19/23 2046   04/19/23 1945  doxycycline (VIBRA-TABS) tablet 100 mg        100 mg Oral  Once 04/19/23 1931 04/19/23 1950         Subjective: Patient seen and examined, still complains of shortness of breath and not feeling well.  No other complaint.  Objective: Vitals:   04/20/23 2102 04/20/23 2108 04/20/23 2257 04/21/23 0411  BP:   (!) 141/64 (!) 158/90  Pulse:   63 70  Resp:   (!) 22 18  Temp:   97.8 F (36.6 C) 97.6 F (36.4 C)  TempSrc:    Oral  SpO2: 97% 97% 92% 93%  Weight:      Height:        Intake/Output Summary (Last 24 hours) at 04/21/2023 0820 Last data filed at 04/21/2023 0404 Gross per 24 hour  Intake 250 ml  Output --  Net 250 ml   Filed Weights   04/19/23 1737  Weight: 61.2 kg    Examination:  General exam: Appears calm and comfortable  Respiratory system: Mild bilateral expiratory wheezes. Respiratory effort normal. Cardiovascular system: S1 & S2 heard, RRR. No JVD, murmurs, rubs, gallops or clicks. No pedal edema. Gastrointestinal system: Abdomen is nondistended, soft and nontender. No organomegaly or masses felt. Normal bowel sounds heard. Central nervous system: Alert and oriented. No focal neurological deficits. Extremities: Symmetric 5 x 5 power. Skin: No rashes, lesions or ulcers Psychiatry: Judgement and insight appear normal. Mood & affect appropriate.    Data Reviewed: I have personally reviewed following labs and imaging studies  CBC: Recent Labs  Lab 04/19/23 1815 04/19/23 1950 04/20/23 1244 04/21/23 0538  WBC 8.2  --   --  5.2  NEUTROABS 5.2  --   --   --   HGB 13.2 14.3 13.6 12.3  HCT 40.9 42.0 40.0 39.0  MCV 98.6  --   --  100.3*  PLT 242  --   --  261   Basic  Metabolic Panel: Recent Labs  Lab 04/19/23 1815 04/19/23 1950 04/20/23 0933 04/20/23 1244 04/21/23 0538  NA 137 136 137 135 136  K 2.7* 2.7* 3.8 4.1 4.7  CL 94*  --  98  --  97*  CO2 34*  --  30  --  28  GLUCOSE 108*  --  119*  --  169*  BUN 10  --  7*  --  11  CREATININE 0.46  --  0.32*  --  0.36*  CALCIUM 9.3  --  8.7*  --  8.7*  MG  --   --   --   --  2.1  PHOS  --   --   --   --  2.8   GFR: Estimated  Creatinine Clearance: 56.5 mL/min (A) (by C-G formula based on SCr of 0.36 mg/dL (L)). Liver Function Tests: Recent Labs  Lab 04/19/23 1815  AST 18  ALT 11  ALKPHOS 42  BILITOT 0.3  PROT 6.7  ALBUMIN 4.2   No results for input(s): "LIPASE", "AMYLASE" in the last 168 hours. No results for input(s): "AMMONIA" in the last 168 hours. Coagulation Profile: No results for input(s): "INR", "PROTIME" in the last 168 hours. Cardiac Enzymes: No results for input(s): "CKTOTAL", "CKMB", "CKMBINDEX", "TROPONINI" in the last 168 hours. BNP (last 3 results) No results for input(s): "PROBNP" in the last 8760 hours. HbA1C: No results for input(s): "HGBA1C" in the last 72 hours. CBG: No results for input(s): "GLUCAP" in the last 168 hours. Lipid Profile: Recent Labs    04/21/23 0538  CHOL 126  HDL 62  LDLCALC 45  TRIG 96  CHOLHDL 2.0   Thyroid Function Tests: No results for input(s): "TSH", "T4TOTAL", "FREET4", "T3FREE", "THYROIDAB" in the last 72 hours. Anemia Panel: No results for input(s): "VITAMINB12", "FOLATE", "FERRITIN", "TIBC", "IRON", "RETICCTPCT" in the last 72 hours. Sepsis Labs: Recent Labs  Lab 04/20/23 1636  PROCALCITON <0.10    Recent Results (from the past 240 hours)  Resp panel by RT-PCR (RSV, Flu A&B, Covid) Anterior Nasal Swab     Status: Abnormal   Collection Time: 04/14/23 12:52 PM   Specimen: Anterior Nasal Swab  Result Value Ref Range Status   SARS Coronavirus 2 by RT PCR NEGATIVE NEGATIVE Final    Comment: (NOTE) SARS-CoV-2 target nucleic  acids are NOT DETECTED.  The SARS-CoV-2 RNA is generally detectable in upper respiratory specimens during the acute phase of infection. The lowest concentration of SARS-CoV-2 viral copies this assay can detect is 138 copies/mL. A negative result does not preclude SARS-Cov-2 infection and should not be used as the sole basis for treatment or other patient management decisions. A negative result may occur with  improper specimen collection/handling, submission of specimen other than nasopharyngeal swab, presence of viral mutation(s) within the areas targeted by this assay, and inadequate number of viral copies(<138 copies/mL). A negative result must be combined with clinical observations, patient history, and epidemiological information. The expected result is Negative.  Fact Sheet for Patients:  BloggerCourse.com  Fact Sheet for Healthcare Providers:  SeriousBroker.it  This test is no t yet approved or cleared by the [place] FDA and  has been authorized for detection and/or diagnosis of SARS-CoV-2 by FDA under an Emergency Use Authorization (EUA). This EUA will remain  in effect (meaning this test can be used) for the duration of the COVID-19 declaration under Section 564(b)(1) of the Act, 21 U.S.C.section 360bbb-3(b)(1), unless the authorization is terminated  or revoked sooner.       Influenza A by PCR POSITIVE (A) NEGATIVE Final   Influenza B by PCR NEGATIVE NEGATIVE Final    Comment: (NOTE) The Xpert Xpress SARS-CoV-2/FLU/RSV plus assay is intended as an aid in the diagnosis of influenza from Nasopharyngeal swab specimens and should not be used as a sole basis for treatment. Nasal washings and aspirates are unacceptable for Xpert Xpress SARS-CoV-2/FLU/RSV testing.  Fact Sheet for Patients: BloggerCourse.com  Fact Sheet for Healthcare  Providers: SeriousBroker.it  This test is not yet approved or cleared by the [place] FDA and has been authorized for detection and/or diagnosis of SARS-CoV-2 by FDA under an Emergency Use Authorization (EUA). This EUA will remain in effect (meaning this test can be used) for the duration of the  COVID-19 declaration under Section 564(b)(1) of the Act, 21 U.S.C. section 360bbb-3(b)(1), unless the authorization is terminated or revoked.     Resp Syncytial Virus by PCR NEGATIVE NEGATIVE Final    Comment: (NOTE) Fact Sheet for Patients: BloggerCourse.com  Fact Sheet for Healthcare Providers: SeriousBroker.it  This test is not yet approved or cleared by the [place] FDA and has been authorized for detection and/or diagnosis of SARS-CoV-2 by FDA under an Emergency Use Authorization (EUA). This EUA will remain in effect (meaning this test can be used) for the duration of the COVID-19 declaration under Section 564(b)(1) of the Act, 21 U.S.C. section 360bbb-3(b)(1), unless the authorization is terminated or revoked.  Performed at [facility], [address] [place], [place] [address]   Resp panel by RT-PCR (RSV, Flu A&B, Covid) Anterior Nasal Swab     Status: Abnormal   Collection Time: 04/19/23  6:15 PM   Specimen: Anterior Nasal Swab  Result Value Ref Range Status   SARS Coronavirus 2 by RT PCR NEGATIVE NEGATIVE Final    Comment: (NOTE) SARS-CoV-2 target nucleic acids are NOT DETECTED.  The SARS-CoV-2 RNA is generally detectable in upper respiratory specimens during the acute phase of infection. The lowest concentration of SARS-CoV-2 viral copies this assay can detect is 138 copies/mL. A negative result does not preclude SARS-Cov-2 infection and should not be used as the sole basis for treatment or other patient management decisions. A negative result may occur with   improper specimen collection/handling, submission of specimen other than nasopharyngeal swab, presence of viral mutation(s) within the areas targeted by this assay, and inadequate number of viral copies(<138 copies/mL). A negative result must be combined with clinical observations, patient history, and epidemiological information. The expected result is Negative.  Fact Sheet for Patients:  BloggerCourse.com  Fact Sheet for Healthcare Providers:  SeriousBroker.it  This test is no t yet approved or cleared by the [place] FDA and  has been authorized for detection and/or diagnosis of SARS-CoV-2 by FDA under an Emergency Use Authorization (EUA). This EUA will remain  in effect (meaning this test can be used) for the duration of the COVID-19 declaration under Section 564(b)(1) of the Act, 21 U.S.C.section 360bbb-3(b)(1), unless the authorization is terminated  or revoked sooner.       Influenza A by PCR POSITIVE (A) NEGATIVE Final   Influenza B by PCR NEGATIVE NEGATIVE Final    Comment: (NOTE) The Xpert Xpress SARS-CoV-2/FLU/RSV plus assay is intended as an aid in the diagnosis of influenza from Nasopharyngeal swab specimens and should not be used as a sole basis for treatment. Nasal washings and aspirates are unacceptable for Xpert Xpress SARS-CoV-2/FLU/RSV testing.  Fact Sheet for Patients: BloggerCourse.com  Fact Sheet for Healthcare Providers: SeriousBroker.it  This test is not yet approved or cleared by the [place] FDA and has been authorized for detection and/or diagnosis of SARS-CoV-2 by FDA under an Emergency Use Authorization (EUA). This EUA will remain in effect (meaning this test can be used) for the duration of the COVID-19 declaration under Section 564(b)(1) of the Act, 21 U.S.C. section 360bbb-3(b)(1), unless the authorization is terminated  or revoked.     Resp Syncytial Virus by PCR NEGATIVE NEGATIVE Final    Comment: (NOTE) Fact Sheet for Patients: BloggerCourse.com  Fact Sheet for Healthcare Providers: SeriousBroker.it  This test is not yet approved or cleared by the [place] FDA and has been authorized for detection and/or diagnosis of SARS-CoV-2 by FDA under an Emergency Use Authorization (  EUA). This EUA will remain in effect (meaning this test can be used) for the duration of the COVID-19 declaration under Section 564(b)(1) of the Act, 21 U.S.C. section 360bbb-3(b)(1), unless the authorization is terminated or revoked.  Performed at [facility], [address] [place], [place] [address]      Radiology Studies: [facility] Chest [facility] 1 View Result Date: 04/20/2023 CLINICAL DATA:  COPD.  Shortness of breath. EXAM: PORTABLE CHEST 1 VIEW COMPARISON:  04/19/2023. FINDINGS: Redemonstration of nonspecific opacities overlying the right lower lung zone, which may represent focal pneumonitis versus atelectasis. Correlate clinically. There are additional areas of atelectasis/scarring at the right lung base just above the diaphragm. Bilateral lung fields are otherwise clear. Bilateral costophrenic angles are clear. Normal cardio-mediastinal silhouette. No acute osseous abnormalities. The soft tissues are within normal limits. IMPRESSION: Redemonstration of nonspecific opacities overlying the right lower lung zone, which may represent focal pneumonitis versus atelectasis. Electronically Signed   By: Jules Schick M.D.   On: 04/20/2023 14:44   DG Chest 2 View Result Date: 04/19/2023 CLINICAL DATA:  SOB EXAM: CHEST - 2 VIEW COMPARISON:  Chest x-ray 02/24/2023, CT chest 02/25/2023 FINDINGS: The heart and mediastinal contours are within normal limits. Hyperinflation of the lungs. Interval development of right lower lobe airspace opacity. Chronic coarsened  interstitial markings with no overt pulmonary edema. No pleural effusion. No pneumothorax. No acute osseous abnormality. IMPRESSION: 1. Interval development of right lower lobe airspace opacity. Followup PA and lateral chest X-ray is recommended in 3-4 weeks following therapy to ensure resolution. 2.  Emphysema (ICD10-J43.9). Electronically Signed   By: Tish Frederickson M.D.   On: 04/19/2023 19:13    Scheduled Meds:  atorvastatin  10 mg Oral QPM   citalopram  40 mg Oral QPM   doxycycline  100 mg Oral Q12H   enoxaparin (LOVENOX) injection  40 mg Subcutaneous Q24H   ipratropium-albuterol  3 mL Nebulization TID   methylPREDNISolone (SOLU-MEDROL) injection  40 mg Intravenous Q8H   mometasone-formoterol  2 puff Inhalation BID   nicotine  14 mg Transdermal Daily   oxyCODONE  5 mg Oral Once   pramipexole  0.25 mg Oral QHS   sodium chloride flush  3 mL Intravenous Q12H   Continuous Infusions:  sodium chloride     cefTRIAXone (ROCEPHIN)  IV 1 g (04/20/23 2203)     LOS: 0 days   Hughie Closs, MD [facility]  04/21/2023, 8:20 AM   *Please note that this is a verbal dictation therefore any spelling or grammatical errors are due to the "Dragon Medical One" system interpretation.  Please page via Amion and do not message via secure chat for urgent patient care matters. Secure chat can be used for non urgent patient care matters.  How to contact the [facility] Attending or Consulting provider 7A - 7P or covering provider during after hours 7P -7A, for this patient?  Check the care team in [facility] and look for a) attending/consulting [facility] provider listed and b) the [facility] team listed. Page or secure chat 7A-7P. Log into [email or web address] and use [place] universal password to access. If you do not have the password, please contact the hospital operator. Locate the [facility] provider you are looking for under [facility] and page to a number that you can be directly reached. If you still have difficulty  reaching the provider, please page the [facility] (Director on Call) for the Hospitalists listed on amion for assistance.

## 2023-04-22 ENCOUNTER — Other Ambulatory Visit (HOSPITAL_COMMUNITY): Payer: Self-pay

## 2023-04-22 DIAGNOSIS — J101 Influenza due to other identified influenza virus with other respiratory manifestations: Secondary | ICD-10-CM | POA: Diagnosis not present

## 2023-04-22 LAB — BASIC METABOLIC PANEL
Anion gap: 14 (ref 5–15)
BUN: 18 mg/dL (ref 8–23)
CO2: 26 mmol/L (ref 22–32)
Calcium: 8.8 mg/dL — ABNORMAL LOW (ref 8.9–10.3)
Chloride: 97 mmol/L — ABNORMAL LOW (ref 98–111)
Creatinine, Ser: <0.3 mg/dL — ABNORMAL LOW (ref 0.44–1.00)
Glucose, Bld: 155 mg/dL — ABNORMAL HIGH (ref 70–99)
Potassium: 4 mmol/L (ref 3.5–5.1)
Sodium: 137 mmol/L (ref 135–145)

## 2023-04-22 MED ORDER — PREDNISONE 50 MG PO TABS
50.0000 mg | ORAL_TABLET | Freq: Every day | ORAL | 0 refills | Status: AC
  Filled 2023-04-22: qty 4, 4d supply, fill #0

## 2023-04-22 MED ORDER — AMOXICILLIN-POT CLAVULANATE 875-125 MG PO TABS
1.0000 | ORAL_TABLET | Freq: Two times a day (BID) | ORAL | 0 refills | Status: AC
  Filled 2023-04-22: qty 8, 4d supply, fill #0

## 2023-04-22 NOTE — Discharge Summary (Signed)
 Physician Discharge Summary  Erica Richardson:[id number] DOB: 05/27/1950 DOA: 04/19/2023  PCP: Genia Hotter, FNP  Admit date: 04/19/2023 Discharge date: 04/22/2023 30 Day Unplanned Readmission Risk Score    Flowsheet Row ED to Hosp-Admission (Current) from 04/19/2023 in [facility]  30 Day Unplanned Readmission Risk Score (%) 27.94 Filed at 04/22/2023 0800       This score is the patient's risk of an unplanned readmission within 30 days of being discharged (0 -100%). The score is based on dignosis, age, lab data, medications, orders, and past utilization.   Low:  0-14.9   Medium: 15-21.9   High: 22-29.9   Extreme: 30 and above          Admitted From: Home Disposition: Home  Recommendations for Outpatient Follow-up:  Follow up with PCP in 1-2 weeks Please obtain BMP/CBC in one week Please follow up with your PCP on the following pending results: Unresulted Labs (From admission, onward)    None         Home Health: None Equipment/Devices: Home oxygen  Discharge Condition: Stable CODE STATUS: Full code Diet recommendation: Cardiac  Subjective: Seen and examined, feels well, no shortness of breath at all.  She wants to go home.  Brief/Interim Summary: Patient is a 73 years old female with past medical history of COPD, anxiety, hyperlipidemia, osteoarthritis presented to hospital with increasing cough and shortness of breath for 4 to 5 days mostly over 2 days.  No other complaint.    At baseline , patient does not wear oxygen at home but was noted to be hypoxic with pulse ox of 70% and room air in the ED so was placed on 2 L of oxygen. She was tachypneic but afebrile.  Labs showed hypokalemia with potassium of 2.7.  Influenza was positive.  Chest x-ray done in the ED showed right lower lobe opacity.  Patient admitted under hospital service.  Details below.   Acute hypoxic respiratory failure secondary to acute COPD exacerbation and  influenza A infection, POA, community-acquired pneumonia ruled out: Patient requiring 3 L of oxygen to maintain oxygen saturation over 90% initially.  Tested positive for influenza A in the ED.  Chest x-ray indicated of possible right lower lobe infiltrate however procalcitonin negative, patient afebrile, no leukocytosis, clinically speaking, I do not think she has bacterial pneumonia.  Doxycycline will be enough for COPD exacerbation.  Patient has improved, no shortness of breath.  No wheezes on clinical examination however when we did ambulatory oximetry on her, she appeared to be requiring 2 L of oxygen to maintain her oxygen saturation over 90%.  I offered the patient to stay overnight however patient prefers to go home with oxygen.  She does not like to take doxycycline or azithromycin so I have discharged on 5 days of Augmentin and 4 days of prednisone.  Resume rest of the home medications.   Hypokalemia: Resolved.   History of anxiety: On Celexa.   History of nicotine use disorder: She states that she quitted 2 weeks back.  She is on nicotine patch.  I have discussed tobacco cessation with the patient.  I have counseled the patient regarding the negative impacts of continued tobacco use including but not limited to lung cancer, COPD, and cardiovascular disease.  I have discussed alternatives to tobacco and modalities that may help facilitate tobacco cessation including but not limited to biofeedback, hypnosis, and medications.  Total time spent with tobacco counseling was 5 minutes.  Hyperlipidemia: Continue statin.  Discharge plan was discussed with patient and/or family member and they verbalized understanding and agreed with it.  Discharge Diagnoses:  Principal Problem:   Influenza A Active Problems:   Smoker   COPD (chronic obstructive pulmonary disease) (HCC)    Discharge Instructions   Allergies as of 04/22/2023       Reactions   Aleve [naproxen] Other (See Comments)   Chantix  [varenicline]    Nightmares   Codeine Other (See Comments)   Upset stomach   Ibuprofen Other (See Comments)   Upset stomach   Meloxicam Other (See Comments)   Methocarbamol Diarrhea   Pravastatin Sodium    Severe muscle aches   Simvastatin    Severe muscle aches   Sulfa Antibiotics    Azithromycin Rash   Erythromycin Rash   Prednisone Anxiety   Pt states she had surgery and 1 week post op was given "large doses" of IV steroids and she "was thrashing around, could not sit still, etc"        Medication List     STOP taking these medications    baclofen 10 MG tablet Commonly known as: LIORESAL   HYDROcodone bit-homatropine 5-1.5 MG/5ML syrup Commonly known as: Hydromet   ipratropium-albuterol 0.5-2.5 (3) MG/3ML Soln Commonly known as: DUONEB       TAKE these medications    acetaminophen 500 MG tablet Commonly known as: TYLENOL Take 1,000 mg by mouth every 6 (six) hours as needed for mild pain (pain score 1-3) or moderate pain (pain score 4-6).   amoxicillin-clavulanate 875-125 MG tablet Commonly known as: AUGMENTIN Take 1 tablet by mouth 2 (two) times daily for 4 days.   atorvastatin 10 MG tablet Commonly known as: LIPITOR Take 10 mg by mouth every evening.   benzonatate 100 MG capsule Commonly known as: TESSALON Take 1 capsule (100 mg total) by mouth 3 (three) times daily as needed.   BIOTIN PO Take 1 tablet by mouth daily.   citalopram 40 MG tablet Commonly known as: CELEXA Take 40 mg by mouth every evening.   denosumab 60 MG/ML Sosy injection Commonly known as: PROLIA Inject 60 mg into the skin every 6 (six) months.   Ensifentrine 3 MG/2.5ML Susp Inhale 1 ampule into the lungs in the morning and at bedtime. What changed:  when to take this reasons to take this   fluticasone 50 MCG/ACT nasal spray Commonly known as: FLONASE Place 1 spray into both nostrils daily as needed for allergies or rhinitis.   guaiFENesin-dextromethorphan 100-10  MG/5ML syrup Commonly known as: ROBITUSSIN DM Take 10 mLs by mouth every 4 (four) hours as needed for cough.   lidocaine 5 % Commonly known as: LIDODERM Place 1 patch onto the skin 2 (two) times daily between meals as needed.   loperamide 2 MG tablet Commonly known as: IMODIUM A-D Take 2 mg by mouth daily as needed for diarrhea or loose stools.   metoCLOPramide 10 MG tablet Commonly known as: REGLAN Take 1 tablet (10 mg total) by mouth every 8 (eight) hours as needed for up to 3 days for nausea.   nicotine 14 mg/24hr patch Commonly known as: NICODERM CQ - dosed in mg/24 hours Place 1 patch (14 mg total) onto the skin daily.   ondansetron 8 MG disintegrating tablet Commonly known as: ZOFRAN-ODT Take 8 mg by mouth every 6 (six) hours as needed.   oseltamivir 75 MG capsule Commonly known as: TAMIFLU Take 1 capsule (75 mg total) by mouth every  12 (twelve) hours.   Oxycodone HCl 10 MG Tabs Take 10 mg by mouth every 6 (six) hours as needed.   pramipexole 0.125 MG tablet Commonly known as: MIRAPEX Take 0.25 mg by mouth at bedtime.   predniSONE 50 MG tablet Commonly known as: DELTASONE Take 1 tablet (50 mg total) by mouth daily with breakfast for 4 days.   ProAir HFA 108 (90 Base) MCG/ACT inhaler Generic drug: albuterol Inhale 1 puff into the lungs every 6 (six) hours as needed for wheezing or shortness of breath.   Symbicort 80-4.5 MCG/ACT inhaler Generic drug: budesonide-formoterol Inhale 2 puffs into the lungs 2 (two) times daily.   Vitamin D (Ergocalciferol) 1.25 MG (50000 UNIT) Caps capsule Commonly known as: DRISDOL Take 50,000 Units by mouth once a week.               Durable Medical Equipment  (From admission, onward)           Start     Ordered   04/22/23 1133  For home use only DME oxygen  Once       Question Answer Comment  Length of Need Lifetime   Mode or (Route) Nasal cannula   Liters per Minute 2   Frequency Continuous (stationary and  portable oxygen unit needed)   Oxygen delivery system Gas      04/22/23 1132            Follow-up Information     Stamey, Verda Cumins, FNP Follow up in 1 week(s).   Specialty: Family Medicine Contact information: [address] [place] [address] [phone]                Allergies  Allergen Reactions   Aleve [Naproxen] Other (See Comments)   Chantix [Varenicline]     Nightmares   Codeine Other (See Comments)    Upset stomach   Ibuprofen Other (See Comments)    Upset stomach   Meloxicam Other (See Comments)   Methocarbamol Diarrhea   Pravastatin Sodium     Severe muscle aches    Simvastatin     Severe muscle aches   Sulfa Antibiotics    Azithromycin Rash   Erythromycin Rash   Prednisone Anxiety    Pt states she had surgery and 1 week post op was given "large doses" of IV steroids and she "was thrashing around, could not sit still, etc"    Consultations: None   Procedures/Studies: DG Chest Port 1 View Result Date: 04/20/2023 CLINICAL DATA:  COPD.  Shortness of breath. EXAM: PORTABLE CHEST 1 VIEW COMPARISON:  04/19/2023. FINDINGS: Redemonstration of nonspecific opacities overlying the right lower lung zone, which may represent focal pneumonitis versus atelectasis. Correlate clinically. There are additional areas of atelectasis/scarring at the right lung base just above the diaphragm. Bilateral lung fields are otherwise clear. Bilateral costophrenic angles are clear. Normal cardio-mediastinal silhouette. No acute osseous abnormalities. The soft tissues are within normal limits. IMPRESSION: Redemonstration of nonspecific opacities overlying the right lower lung zone, which may represent focal pneumonitis versus atelectasis. Electronically Signed   By: Jules Schick M.D.   On: 04/20/2023 14:44   DG Chest 2 View Result Date: 04/19/2023 CLINICAL DATA:  SOB EXAM: CHEST - 2 VIEW COMPARISON:  Chest x-ray 02/24/2023, CT chest 02/25/2023 FINDINGS: The heart  and mediastinal contours are within normal limits. Hyperinflation of the lungs. Interval development of right lower lobe airspace opacity. Chronic coarsened interstitial markings with no overt pulmonary edema. No pleural effusion. No  pneumothorax. No acute osseous abnormality. IMPRESSION: 1. Interval development of right lower lobe airspace opacity. Followup PA and lateral chest X-ray is recommended in 3-4 weeks following therapy to ensure resolution. 2.  Emphysema (ICD10-J43.9). Electronically Signed   By: Tish Frederickson M.D.   On: 04/19/2023 19:13   XR C-ARM NO REPORT Result Date: 04/13/2023 Please see Notes tab for imaging impression.    Discharge Exam: Vitals:   04/22/23 0449 04/22/23 1039  BP: (!) 149/76   Pulse: 76   Resp: 17   Temp: 98.1 F (36.7 C)   SpO2: 94% 92%   Vitals:   04/21/23 1818 04/21/23 1940 04/22/23 0449 04/22/23 1039  BP:  (!) 132/56 (!) 149/76   Pulse:  (!) 102 76   Resp:  16 17   Temp:  97.7 F (36.5 C) 98.1 F (36.7 C)   TempSrc:      SpO2: 96% 92% 94% 92%  Weight:      Height:        General: Pt is alert, awake, not in acute distress Cardiovascular: RRR, S1/S2 +, no rubs, no gallops Respiratory: CTA bilaterally, no wheezing, no rhonchi Abdominal: Soft, NT, ND, bowel sounds + Extremities: no edema, no cyanosis    The results of significant diagnostics from this hospitalization (including imaging, microbiology, ancillary and laboratory) are listed below for reference.     Microbiology: Recent Results (from the past 240 hours)  Resp panel by RT-PCR (RSV, Flu A&B, Covid) Anterior Nasal Swab     Status: Abnormal   Collection Time: 04/14/23 12:52 PM   Specimen: Anterior Nasal Swab  Result Value Ref Range Status   SARS Coronavirus 2 by RT PCR NEGATIVE NEGATIVE Final    Comment: (NOTE) SARS-CoV-2 target nucleic acids are NOT DETECTED.  The SARS-CoV-2 RNA is generally detectable in upper respiratory specimens during the acute phase of infection.  The lowest concentration of SARS-CoV-2 viral copies this assay can detect is 138 copies/mL. A negative result does not preclude SARS-Cov-2 infection and should not be used as the sole basis for treatment or other patient management decisions. A negative result may occur with  improper specimen collection/handling, submission of specimen other than nasopharyngeal swab, presence of viral mutation(s) within the areas targeted by this assay, and inadequate number of viral copies(<138 copies/mL). A negative result must be combined with clinical observations, patient history, and epidemiological information. The expected result is Negative.  Fact Sheet for Patients:  BloggerCourse.com  Fact Sheet for Healthcare Providers:  SeriousBroker.it  This test is no t yet approved or cleared by the [place] FDA and  has been authorized for detection and/or diagnosis of SARS-CoV-2 by FDA under an Emergency Use Authorization (EUA). This EUA will remain  in effect (meaning this test can be used) for the duration of the COVID-19 declaration under Section 564(b)(1) of the Act, 21 U.S.C.section 360bbb-3(b)(1), unless the authorization is terminated  or revoked sooner.       Influenza A by PCR POSITIVE (A) NEGATIVE Final   Influenza B by PCR NEGATIVE NEGATIVE Final    Comment: (NOTE) The Xpert Xpress SARS-CoV-2/FLU/RSV plus assay is intended as an aid in the diagnosis of influenza from Nasopharyngeal swab specimens and should not be used as a sole basis for treatment. Nasal washings and aspirates are unacceptable for Xpert Xpress SARS-CoV-2/FLU/RSV testing.  Fact Sheet for Patients: BloggerCourse.com  Fact Sheet for Healthcare Providers: SeriousBroker.it  This test is not yet approved or cleared by the [place] FDA and has  been authorized for detection and/or diagnosis of SARS-CoV-2  by FDA under an Emergency Use Authorization (EUA). This EUA will remain in effect (meaning this test can be used) for the duration of the COVID-19 declaration under Section 564(b)(1) of the Act, 21 U.S.C. section 360bbb-3(b)(1), unless the authorization is terminated or revoked.     Resp Syncytial Virus by PCR NEGATIVE NEGATIVE Final    Comment: (NOTE) Fact Sheet for Patients: BloggerCourse.com  Fact Sheet for Healthcare Providers: SeriousBroker.it  This test is not yet approved or cleared by the [place] FDA and has been authorized for detection and/or diagnosis of SARS-CoV-2 by FDA under an Emergency Use Authorization (EUA). This EUA will remain in effect (meaning this test can be used) for the duration of the COVID-19 declaration under Section 564(b)(1) of the Act, 21 U.S.C. section 360bbb-3(b)(1), unless the authorization is terminated or revoked.  Performed at [facility], [address] [place], [place] [address]   Resp panel by RT-PCR (RSV, Flu A&B, Covid) Anterior Nasal Swab     Status: Abnormal   Collection Time: 04/19/23  6:15 PM   Specimen: Anterior Nasal Swab  Result Value Ref Range Status   SARS Coronavirus 2 by RT PCR NEGATIVE NEGATIVE Final    Comment: (NOTE) SARS-CoV-2 target nucleic acids are NOT DETECTED.  The SARS-CoV-2 RNA is generally detectable in upper respiratory specimens during the acute phase of infection. The lowest concentration of SARS-CoV-2 viral copies this assay can detect is 138 copies/mL. A negative result does not preclude SARS-Cov-2 infection and should not be used as the sole basis for treatment or other patient management decisions. A negative result may occur with  improper specimen collection/handling, submission of specimen other than nasopharyngeal swab, presence of viral mutation(s) within the areas targeted by this assay, and inadequate number of  viral copies(<138 copies/mL). A negative result must be combined with clinical observations, patient history, and epidemiological information. The expected result is Negative.  Fact Sheet for Patients:  BloggerCourse.com  Fact Sheet for Healthcare Providers:  SeriousBroker.it  This test is no t yet approved or cleared by the [place] FDA and  has been authorized for detection and/or diagnosis of SARS-CoV-2 by FDA under an Emergency Use Authorization (EUA). This EUA will remain  in effect (meaning this test can be used) for the duration of the COVID-19 declaration under Section 564(b)(1) of the Act, 21 U.S.C.section 360bbb-3(b)(1), unless the authorization is terminated  or revoked sooner.       Influenza A by PCR POSITIVE (A) NEGATIVE Final   Influenza B by PCR NEGATIVE NEGATIVE Final    Comment: (NOTE) The Xpert Xpress SARS-CoV-2/FLU/RSV plus assay is intended as an aid in the diagnosis of influenza from Nasopharyngeal swab specimens and should not be used as a sole basis for treatment. Nasal washings and aspirates are unacceptable for Xpert Xpress SARS-CoV-2/FLU/RSV testing.  Fact Sheet for Patients: BloggerCourse.com  Fact Sheet for Healthcare Providers: SeriousBroker.it  This test is not yet approved or cleared by the [place] FDA and has been authorized for detection and/or diagnosis of SARS-CoV-2 by FDA under an Emergency Use Authorization (EUA). This EUA will remain in effect (meaning this test can be used) for the duration of the COVID-19 declaration under Section 564(b)(1) of the Act, 21 U.S.C. section 360bbb-3(b)(1), unless the authorization is terminated or revoked.     Resp Syncytial Virus by PCR NEGATIVE NEGATIVE Final    Comment: (NOTE) Fact Sheet for Patients: BloggerCourse.com  Fact Sheet for Healthcare  Providers: SeriousBroker.it  This test is not yet approved or cleared by the [place] and has been authorized for detection and/or diagnosis of SARS-CoV-2 by FDA under an Emergency Use Authorization (EUA). This EUA will remain in effect (meaning this test can be used) for the duration of the COVID-19 declaration under Section 564(b)(1) of the Act, 21 U.S.C. section 360bbb-3(b)(1), unless the authorization is terminated or revoked.  Performed at [facility], [address], [place], [place] [address]      Labs: BNP (last 3 results) Recent Labs    12/21/22 1927 02/24/23 1840  BNP 131.3* 90.1   Basic Metabolic Panel: Recent Labs  Lab 04/19/23 1815 04/19/23 1950 04/20/23 0933 04/20/23 1244 04/21/23 0538 04/22/23 0941  NA 137 136 137 135 136 137  K 2.7* 2.7* 3.8 4.1 4.7 4.0  CL 94*  --  98  --  97* 97*  CO2 34*  --  30  --  28 26  GLUCOSE 108*  --  119*  --  169* 155*  BUN 10  --  7*  --  11 18  CREATININE 0.46  --  0.32*  --  0.36* <0.30*  CALCIUM 9.3  --  8.7*  --  8.7* 8.8*  MG  --   --   --   --  2.1  --   PHOS  --   --   --   --  2.8  --    Liver Function Tests: Recent Labs  Lab 04/19/23 1815  AST 18  ALT 11  ALKPHOS 42  BILITOT 0.3  PROT 6.7  ALBUMIN 4.2   No results for input(s): "LIPASE", "AMYLASE" in the last 168 hours. No results for input(s): "AMMONIA" in the last 168 hours. CBC: Recent Labs  Lab 04/19/23 1815 04/19/23 1950 04/20/23 1244 04/21/23 0538  WBC 8.2  --   --  5.2  NEUTROABS 5.2  --   --   --   HGB 13.2 14.3 13.6 12.3  HCT 40.9 42.0 40.0 39.0  MCV 98.6  --   --  100.3*  PLT 242  --   --  261   Cardiac Enzymes: No results for input(s): "CKTOTAL", "CKMB", "CKMBINDEX", "TROPONINI" in the last 168 hours. BNP: Invalid input(s): "POCBNP" CBG: No results for input(s): "GLUCAP" in the last 168 hours. D-Dimer No results for input(s): "DDIMER" in the last 72 hours. Hgb  A1c Recent Labs    04/21/23 0538  HGBA1C 5.8*   Lipid Profile Recent Labs    04/21/23 0538  CHOL 126  HDL 62  LDLCALC 45  TRIG 96  CHOLHDL 2.0   Thyroid function studies No results for input(s): "TSH", "T4TOTAL", "T3FREE", "THYROIDAB" in the last 72 hours.  Invalid input(s): "FREET3" Anemia work up No results for input(s): "VITAMINB12", "FOLATE", "FERRITIN", "TIBC", "IRON", "RETICCTPCT" in the last 72 hours. Urinalysis    Component Value Date/Time   COLORURINE STRAW (A) 02/12/2023 0541   APPEARANCEUR CLEAR 02/12/2023 0541   LABSPEC 1.006 02/12/2023 0541   PHURINE 6.0 02/12/2023 0541   GLUCOSEU NEGATIVE 02/12/2023 0541   HGBUR NEGATIVE 02/12/2023 0541   BILIRUBINUR NEGATIVE 02/12/2023 0541   KETONESUR 20 (A) 02/12/2023 0541   PROTEINUR NEGATIVE 02/12/2023 0541   UROBILINOGEN 0.2 02/06/2010 0532   NITRITE NEGATIVE 02/12/2023 0541   LEUKOCYTESUR NEGATIVE 02/12/2023 0541   Sepsis Labs Recent Labs  Lab 04/19/23 1815 04/21/23 0538  WBC 8.2 5.2   Microbiology Recent Results (from the past 240 hours)  Resp panel by RT-PCR (RSV, Flu A&B, Covid) Anterior Nasal Swab     Status: Abnormal   Collection Time: 04/14/23 12:52 PM   Specimen: Anterior Nasal Swab  Result Value Ref Range Status   SARS Coronavirus 2 by RT PCR NEGATIVE NEGATIVE Final    Comment: (NOTE) SARS-CoV-2 target nucleic acids are NOT DETECTED.  The SARS-CoV-2 RNA is generally detectable in upper respiratory specimens during the acute phase of infection. The lowest concentration of SARS-CoV-2 viral copies this assay can detect is 138 copies/mL. A negative result does not preclude SARS-Cov-2 infection and should not be used as the sole basis for treatment or other patient management decisions. A negative result may occur with  improper specimen collection/handling, submission of specimen other than nasopharyngeal swab, presence of viral mutation(s) within the areas targeted by this assay, and inadequate  number of viral copies(<138 copies/mL). A negative result must be combined with clinical observations, patient history, and epidemiological information. The expected result is Negative.  Fact Sheet for Patients:  BloggerCourse.com  Fact Sheet for Healthcare Providers:  SeriousBroker.it  This test is no t yet approved or cleared by the [place] FDA and  has been authorized for detection and/or diagnosis of SARS-CoV-2 by FDA under an Emergency Use Authorization (EUA). This EUA will remain  in effect (meaning this test can be used) for the duration of the COVID-19 declaration under Section 564(b)(1) of the Act, 21 U.S.C.section 360bbb-3(b)(1), unless the authorization is terminated  or revoked sooner.       Influenza A by PCR POSITIVE (A) NEGATIVE Final   Influenza B by PCR NEGATIVE NEGATIVE Final    Comment: (NOTE) The Xpert Xpress SARS-CoV-2/FLU/RSV plus assay is intended as an aid in the diagnosis of influenza from Nasopharyngeal swab specimens and should not be used as a sole basis for treatment. Nasal washings and aspirates are unacceptable for Xpert Xpress SARS-CoV-2/FLU/RSV testing.  Fact Sheet for Patients: BloggerCourse.com  Fact Sheet for Healthcare Providers: SeriousBroker.it  This test is not yet approved or cleared by the [place] FDA and has been authorized for detection and/or diagnosis of SARS-CoV-2 by FDA under an Emergency Use Authorization (EUA). This EUA will remain in effect (meaning this test can be used) for the duration of the COVID-19 declaration under Section 564(b)(1) of the Act, 21 U.S.C. section 360bbb-3(b)(1), unless the authorization is terminated or revoked.     Resp Syncytial Virus by PCR NEGATIVE NEGATIVE Final    Comment: (NOTE) Fact Sheet for Patients: BloggerCourse.com  Fact Sheet for Healthcare  Providers: SeriousBroker.it  This test is not yet approved or cleared by the [place] FDA and has been authorized for detection and/or diagnosis of SARS-CoV-2 by FDA under an Emergency Use Authorization (EUA). This EUA will remain in effect (meaning this test can be used) for the duration of the COVID-19 declaration under Section 564(b)(1) of the Act, 21 U.S.C. section 360bbb-3(b)(1), unless the authorization is terminated or revoked.  Performed at [facility], [address], [place], [place] [address]   Resp panel by RT-PCR (RSV, Flu A&B, Covid) Anterior Nasal Swab     Status: Abnormal   Collection Time: 04/19/23  6:15 PM   Specimen: Anterior Nasal Swab  Result Value Ref Range Status   SARS Coronavirus 2 by RT PCR NEGATIVE NEGATIVE Final    Comment: (NOTE) SARS-CoV-2 target nucleic acids are NOT DETECTED.  The SARS-CoV-2 RNA is generally detectable in upper respiratory specimens during the acute phase of infection. The lowest concentration  of SARS-CoV-2 viral copies this assay can detect is 138 copies/mL. A negative result does not preclude SARS-Cov-2 infection and should not be used as the sole basis for treatment or other patient management decisions. A negative result may occur with  improper specimen collection/handling, submission of specimen other than nasopharyngeal swab, presence of viral mutation(s) within the areas targeted by this assay, and inadequate number of viral copies(<138 copies/mL). A negative result must be combined with clinical observations, patient history, and epidemiological information. The expected result is Negative.  Fact Sheet for Patients:  BloggerCourse.com  Fact Sheet for Healthcare Providers:  SeriousBroker.it  This test is no t yet approved or cleared by the [place] FDA and  has been authorized for detection and/or diagnosis of  SARS-CoV-2 by FDA under an Emergency Use Authorization (EUA). This EUA will remain  in effect (meaning this test can be used) for the duration of the COVID-19 declaration under Section 564(b)(1) of the Act, 21 U.S.C.section 360bbb-3(b)(1), unless the authorization is terminated  or revoked sooner.       Influenza A by PCR POSITIVE (A) NEGATIVE Final   Influenza B by PCR NEGATIVE NEGATIVE Final    Comment: (NOTE) The Xpert Xpress SARS-CoV-2/FLU/RSV plus assay is intended as an aid in the diagnosis of influenza from Nasopharyngeal swab specimens and should not be used as a sole basis for treatment. Nasal washings and aspirates are unacceptable for Xpert Xpress SARS-CoV-2/FLU/RSV testing.  Fact Sheet for Patients: BloggerCourse.com  Fact Sheet for Healthcare Providers: SeriousBroker.it  This test is not yet approved or cleared by the [place] FDA and has been authorized for detection and/or diagnosis of SARS-CoV-2 by FDA under an Emergency Use Authorization (EUA). This EUA will remain in effect (meaning this test can be used) for the duration of the COVID-19 declaration under Section 564(b)(1) of the Act, 21 U.S.C. section 360bbb-3(b)(1), unless the authorization is terminated or revoked.     Resp Syncytial Virus by PCR NEGATIVE NEGATIVE Final    Comment: (NOTE) Fact Sheet for Patients: BloggerCourse.com  Fact Sheet for Healthcare Providers: SeriousBroker.it  This test is not yet approved or cleared by the [place] FDA and has been authorized for detection and/or diagnosis of SARS-CoV-2 by FDA under an Emergency Use Authorization (EUA). This EUA will remain in effect (meaning this test can be used) for the duration of the COVID-19 declaration under Section 564(b)(1) of the Act, 21 U.S.C. section 360bbb-3(b)(1), unless the authorization is terminated  or revoked.  Performed at [facility], [address] [place], [place] [address]     FURTHER DISCHARGE INSTRUCTIONS:   Get Medicines reviewed and adjusted: Please take all your medications with you for your next visit with your Primary MD   Laboratory/radiological data: Please request your Primary MD to go over all hospital tests and procedure/radiological results at the follow up, please ask your Primary MD to get all Hospital records sent to his/her office.   In some cases, they will be blood work, cultures and biopsy results pending at the time of your discharge. Please request that your primary care M.D. goes through all the records of your hospital data and follows up on these results.   Also Note the following: If you experience worsening of your admission symptoms, develop shortness of breath, life threatening emergency, suicidal or homicidal thoughts you must seek medical attention immediately by calling 911 or calling your MD immediately  if symptoms less severe.   You must read complete instructions/literature along with  all the possible adverse reactions/side effects for all the Medicines you take and that have been prescribed to you. Take any new Medicines after you have completely understood and accpet all the possible adverse reactions/side effects.    Do not drive when taking Pain medications or sleeping medications (Benzodaizepines)   Do not take more than prescribed Pain, Sleep and Anxiety Medications. It is not advisable to combine anxiety,sleep and pain medications without talking with your primary care practitioner   Special Instructions: If you have smoked or chewed Tobacco  in the last 2 yrs please stop smoking, stop any regular Alcohol  and or any Recreational drug use.   Wear Seat belts while driving.   Please note: You were cared for by a hospitalist during your hospital stay. Once you are discharged, your primary care physician will  handle any further medical issues. Please note that NO REFILLS for any discharge medications will be authorized once you are discharged, as it is imperative that you return to your primary care physician (or establish a relationship with a primary care physician if you do not have one) for your post hospital discharge needs so that they can reassess your need for medications and monitor your lab values  Time coordinating discharge: Over 30 minutes  SIGNED:   Hughie Closs, MD  [facility] 04/22/2023, 11:34 AM *Please note that this is a verbal dictation therefore any spelling or grammatical errors are due to the "Dragon Medical One" system interpretation. If 7PM-7AM, please contact night-coverage [email or web address]

## 2023-04-22 NOTE — Plan of Care (Signed)

## 2023-04-22 NOTE — Progress Notes (Signed)
 Did ambulatory 02 saturation walk with pt. 02 SAT was 87% on RA and while walking pt 02 SAT was 83% on RA then 2LPM of 02 was administered and pt 02 SAT was 93%. Left pt on 2LPM of 02 in room. MD aware.

## 2023-04-22 NOTE — Progress Notes (Addendum)
 TOC  discharge meds in a secure bag delivered to pt in room by this RN. Pt updated on discharge process- waiting on O2 company to be assigned so  O2 can be delivered prior to D/C to home

## 2023-04-22 NOTE — TOC Transition Note (Signed)
 Transition of Care [facility]) - Discharge Note   Patient Details  Name: Erica Richardson MRN: [id number] Date of Birth: 1951/02/18  [facility] of [facility]) CM/SW Contact:  Adrian Prows, RN Phone Number: 04/22/2023, 4:48 PM   Clinical Narrative:    Orders received for home oxygen; spoke w/ pt in room; she agrees to receive recc service; pt says she does not have an agency preference; spoke w/ Vaughan Basta at [place]; he says agency can provide service, and travel tank will be delivered to pt's room; pt notified; agency contact info placed in follow up provider section of d/c instructions.   Final next level of care: Home/Self Care Barriers to Discharge: No Barriers Identified   Patient Goals and CMS Choice            Discharge Placement                       Discharge Plan and Services Additional resources added to the After Visit Summary for                  DME Arranged: Oxygen DME Agency: [employer] Date DME Agency Contacted: 04/22/23 Time DME Agency Contacted: [phone] Representative spoke with at DME Agency: Vaughan Basta HH Arranged: NA HH Agency: NA        Social Drivers of Health (SDOH) Interventions SDOH Screenings   Food Insecurity: No Food Insecurity (04/20/2023)  Housing: Low Risk  (04/20/2023)  Transportation Needs: No Transportation Needs (04/20/2023)  Utilities: Not At Risk (04/20/2023)  Financial Resource Strain: Low Risk  (11/06/2022)   Received from [facility]  Social Connections: Moderately Isolated (04/20/2023)  Tobacco Use: High Risk (04/20/2023)     Readmission Risk Interventions    02/26/2023    2:44 PM 12/22/2022    3:45 PM  Readmission Risk Prevention Plan  Post Dischage Appt  Complete  Medication Screening  Complete  Transportation Screening Complete Complete  PCP or Specialist Appt within 5-7 Days Complete   Home Care Screening Complete   Medication Review (RN CM) Complete

## 2023-04-22 NOTE — Progress Notes (Signed)
 SATURATION QUALIFICATIONS: (This note is used to comply with regulatory documentation for home oxygen)  Patient Saturations on Room Air at Rest = 87%  Patient Saturations on Room Air while Ambulating = 83%  Patient Saturations on 2 Liters of oxygen while Ambulating = 93%  Please briefly explain why patient needs home oxygen:

## 2023-04-22 NOTE — Progress Notes (Signed)
 AVS reviewed w/ pt who verbalized an understanding - Oxygen company added to AVS - Awaiting O2 delivery - no other questions at this time

## 2023-04-23 ENCOUNTER — Other Ambulatory Visit (HOSPITAL_COMMUNITY): Payer: Self-pay

## 2023-04-27 ENCOUNTER — Other Ambulatory Visit: Payer: Self-pay | Admitting: Acute Care

## 2023-04-28 DIAGNOSIS — J441 Chronic obstructive pulmonary disease with (acute) exacerbation: Secondary | ICD-10-CM | POA: Diagnosis not present

## 2023-04-28 DIAGNOSIS — L308 Other specified dermatitis: Secondary | ICD-10-CM | POA: Diagnosis not present

## 2023-05-03 ENCOUNTER — Ambulatory Visit: Payer: Medicare Other | Admitting: Orthopedic Surgery

## 2023-05-05 ENCOUNTER — Encounter: Payer: Self-pay | Admitting: Primary Care

## 2023-05-05 ENCOUNTER — Ambulatory Visit (INDEPENDENT_AMBULATORY_CARE_PROVIDER_SITE_OTHER): Admitting: Primary Care

## 2023-05-05 VITALS — BP 140/80 | HR 74 | Ht 64.0 in | Wt 129.8 lb

## 2023-05-05 DIAGNOSIS — F419 Anxiety disorder, unspecified: Secondary | ICD-10-CM

## 2023-05-05 DIAGNOSIS — Z87891 Personal history of nicotine dependence: Secondary | ICD-10-CM | POA: Diagnosis not present

## 2023-05-05 DIAGNOSIS — J9600 Acute respiratory failure, unspecified whether with hypoxia or hypercapnia: Secondary | ICD-10-CM

## 2023-05-05 DIAGNOSIS — J449 Chronic obstructive pulmonary disease, unspecified: Secondary | ICD-10-CM | POA: Diagnosis not present

## 2023-05-05 MED ORDER — SPIRIVA RESPIMAT 2.5 MCG/ACT IN AERS: 2.0000 | INHALATION_SPRAY | Freq: Every day | RESPIRATORY_TRACT | 3 refills | Status: DC
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
## 2023-05-05 NOTE — Patient Instructions (Addendum)
 -CHRONIC OBSTRUCTIVE PULMONARY DISEASE (COPD): COPD is a chronic lung condition that makes it hard to breathe. You were hospitalized due to a flare-up caused by influenza and possible pneumonia. Continue using Symbicort (or Dulera) two puffs morning and evening. Start Spiriva daily, and start using Ohtuvayre (ensifentrine) nebulizer twice daily once it is delivered. Use your albuterol inhaler as needed. We will monitor your oxygen levels during exertion and consider a portable oxygen concentrator if necessary.   -INFLUENZA: Influenza, or the flu, is a viral infection that affected your COPD. You may continue to feel weak and tired for a while. Engage in light activities and gradually increase your exercise as you feel better. Avoid strenuous activities until you have fully recovered.  -ANXIETY RELATED TO COPD: Anxiety can worsen your breathing difficulties. You are currently taking Celexa for anxiety. Follow up with your primary care doctor for ongoing management. Use your albuterol inhaler if you feel short of breath to help reduce anxiety.  -SMOKING CESSATION: Quitting smoking is crucial for your lung health. You have successfully stopped smoking since your hospitalization. Keep up the good work and continue to avoid smoking.  INSTRUCTIONS: Please follow up with your primary care doctor for anxiety management. We will also assess your oxygen levels during exertion in the office. If you have any questions or concerns, do not hesitate to contact us.  Orders: POC 2L   Follow-up 8 weeks with Dr. Celine Mans     Ensifentrine Nebulizer Suspension What is this medication? ENSIFENTRINE (EN si FEN treen) treats chronic obstructive pulmonary disease (COPD). It works by decreasing inflammation and relaxing muscles in the airway. This makes it easier to breathe. Do not use it to treat a sudden COPD flare-up. This medicine may be used for other purposes; ask your health care provider or pharmacist if you  have questions. COMMON BRAND NAME(S): Ohtuvayre What should I tell my care team before I take this medication? They need to know if you have any of these conditions: Liver disease Mental health conditions An unusual or allergic reaction to ensifentrine, other medications, foods, dyes, or preservatives Pregnant or trying to get pregnant Breastfeeding How should I use this medication? This medication is for inhalation using a nebulizer. Nebulizers make a liquid into an aerosol that you breathe in through your mouth or your mouth and nose and into your lungs. Do not mix this medication with other medications in the nebulizer. Take it as directed on the prescription label. Shake the ampule well before using. Do not use it more often than directed. Keep taking this medication unless your care team tells you to stop. This medication comes with INSTRUCTIONS FOR USE. Ask your pharmacist for directions on how to use this medication. Read the information carefully. Talk to your pharmacist or care team if you have questions. Talk to your care team about the use of this medication in children. Special care may be needed. Overdosage: If you think you have taken too much of this medicine contact a poison control center or emergency room at once. NOTE: This medicine is only for you. Do not share this medicine with others. What if I miss a dose? If you miss a dose, use it as soon as you can. If it is almost time for your next dose, use only that dose. Do not use double or extra doses. What may interact with this medication? Interactions are not expected. This list may not describe all possible interactions. Give your health care provider a list of  all the medicines, herbs, non-prescription drugs, or dietary supplements you use. Also tell them if you smoke, drink alcohol, or use illegal drugs. Some items may interact with your medicine. What should I watch for while using this medication? Visit your care team  for regular checks on your progress. Tell your care team if your symptoms do not start to get better or if they get worse. What side effects may I notice from receiving this medication? Side effects that you should report to your care team as soon as possible: Allergic reactions--skin rash, itching, hives, swelling of the face, lips, tongue, or throat Mood and behavior changes--anxiety, nervousness, confusion, hallucinations, irritability, hostility, thoughts of suicide or self-harm, worsening mood, feelings of depression Wheezing or trouble breathing that is worse after use Side effects that usually do not require medical attention (report these to your care team if they continue or are bothersome): Back pain Diarrhea Trouble sleeping This list may not describe all possible side effects. Call your doctor for medical advice about side effects. You may report side effects to FDA at 1-800-FDA-1088. Where should I keep my medication? Keep out of the reach of children and pets. Store at room temperature between 20 and 25 degrees C (68 and 77 degrees F). Keep unopened ampules in the foil pouch. Protect from light. Avoid exposure to extreme heat and cold. Get rid of any unused medication after the expiration date. To get rid of medications that are no longer needed or have expired: Take the medication to a medication take-back program. Check with your pharmacy or law enforcement to find a location. If you cannot return the medication, check the label or package insert to see if the medication should be thrown out in the garbage or flushed down the toilet. If you are not sure, ask your care team. If it is safe to put it in the trash, empty the medication out of the container. Mix the medication with cat litter, dirt, coffee grounds, or other unwanted substance. Seal the mixture in a bag or container. Put it in the trash. NOTE: This sheet is a summary. It may not cover all possible information. If you have  questions about this medicine, talk to your doctor, pharmacist, or health care provider.  2024 Elsevier/Gold Standard (2023-01-20 00:00:00)   Tiotropium Metered Dose Inhaler (MDI) What is this medication? TIOTROPIUM (tee oh TRO pee um) treats asthma and chronic obstructive pulmonary disease (COPD). It works by opening the airways of the lungs, making it easier to breathe. It is often called a controller inhaler. Do not use it to treat a sudden asthma attack or COPD flare-up. This medicine may be used for other purposes; ask your health care provider or pharmacist if you have questions. COMMON BRAND NAME(S): Spiriva Respimat What should I tell my care team before I take this medication? They need to know if you have any of these conditions: Glaucoma Kidney disease Prostate disease An unusual or allergic reaction to tiotropium, ipratropium, atropine, other medications, foods, dyes, or preservatives Pregnant or trying to get pregnant Breast-feeding How should I use this medication? This medication is inhaled through the mouth. Take it as directed on the prescription label at the same time every day. Do not use it more often than directed. Keep taking it unless your care team tells you to stop. A patient package insert for the product will be given with each prescription and refill. Be sure to read this information carefully each time. The sheet may change often.  Talk to your care team about the use of this medication in children. While it may be prescribed for children as young as 6 years for selected conditions, precautions do apply. Overdosage: If you think you have taken too much of this medicine contact a poison control center or emergency room at once. NOTE: This medicine is only for you. Do not share this medicine with others. What if I miss a dose? If you miss a dose, take it as soon as you can. If it is almost time for your next dose, take only that dose. Do not take double or extra  doses. What may interact with this medication? Antihistamines for allergy, cough, and cold Atropine Certain medications for bladder problems, such as oxybutynin, tolterodine Certain medications for Parkinson disease, such as benztropine, trihexyphenidyl Certain medications for stomach problems, such as dicyclomine, hyoscyamine Certain medications for travel sickness, such as scopolamine Ipratropium This list may not describe all possible interactions. Give your health care provider a list of all the medicines, herbs, non-prescription drugs, or dietary supplements you use. Also tell them if you smoke, drink alcohol, or use illegal drugs. Some items may interact with your medicine. What should I watch for while using this medication? Visit your care team for regular checks on your progress. Tell your care team if your symptoms do not start to get better or if they get worse. Never use this medication for an acute asthma attack. You should use your short-acting rescue inhaler for an acute attack. If your symptoms get worse or if you need your short-acting inhalers more often, call your care team right away. Your mouth may get dry. Chewing sugarless gum or sucking hard candy and drinking plenty of water may help. Contact your care team if the problem does not go away or is severe. This medication may affect your coordination, reaction time, or judgment. Do not drive or operate machinery until you know how this medication affects you. Sit up or stand slowly to reduce the risk of dizzy or fainting spells. Drinking alcohol with this medication can increase the risk of these side effects. What side effects may I notice from receiving this medication? Side effects that you should report to your care team as soon as possible: Allergic reactions--skin rash, itching, hives, swelling of the face, lips, tongue, or throat Sudden eye pain or change in vision such as blurry vision, seeing halos around lights,  vision loss Trouble passing urine Wheezing or trouble breathing that is worse after use Side effects that usually do not require medical attention (report to your care team if they continue or are bothersome): Constipation Dry mouth Runny or stuffy nose Sore throat Upset stomach This list may not describe all possible side effects. Call your doctor for medical advice about side effects. You may report side effects to FDA at 1-800-FDA-1088. Where should I keep my medication? Keep out of the reach of children and pets. Store at room temperature between 20 and 25 degrees C (68 and 77 degrees F). Keep inhaler away from extreme heat, cold, or humidity. Get rid of it 3 months after removing it from the foil pouch, when the dose counter reads "0", or after the expiration date, whichever is first. To get rid of medications that are no longer needed or have expired: Take the medication to a medication take-back program. Check with your pharmacy or law enforcement to find a location. If you cannot return the medication, ask your pharmacist or care team how to get  rid of this medication safely. NOTE: This sheet is a summary. It may not cover all possible information. If you have questions about this medicine, talk to your doctor, pharmacist, or health care provider.  2024 Elsevier/Gold Standard (2021-08-19 00:00:00)

## 2023-05-09 DIAGNOSIS — E559 Vitamin D deficiency, unspecified: Secondary | ICD-10-CM | POA: Diagnosis not present

## 2023-05-09 DIAGNOSIS — E538 Deficiency of other specified B group vitamins: Secondary | ICD-10-CM | POA: Diagnosis not present

## 2023-05-09 DIAGNOSIS — G8929 Other chronic pain: Secondary | ICD-10-CM | POA: Diagnosis not present

## 2023-05-09 DIAGNOSIS — Z6823 Body mass index (BMI) 23.0-23.9, adult: Secondary | ICD-10-CM | POA: Diagnosis not present

## 2023-05-09 DIAGNOSIS — M549 Dorsalgia, unspecified: Secondary | ICD-10-CM | POA: Diagnosis not present

## 2023-05-09 DIAGNOSIS — K5909 Other constipation: Secondary | ICD-10-CM | POA: Diagnosis not present

## 2023-05-09 DIAGNOSIS — Z79899 Other long term (current) drug therapy: Secondary | ICD-10-CM | POA: Diagnosis not present

## 2023-05-09 DIAGNOSIS — R7303 Prediabetes: Secondary | ICD-10-CM | POA: Diagnosis not present

## 2023-05-12 DIAGNOSIS — H40013 Open angle with borderline findings, low risk, bilateral: Secondary | ICD-10-CM | POA: Diagnosis not present

## 2023-05-12 DIAGNOSIS — J439 Emphysema, unspecified: Secondary | ICD-10-CM | POA: Diagnosis not present

## 2023-05-12 DIAGNOSIS — J441 Chronic obstructive pulmonary disease with (acute) exacerbation: Secondary | ICD-10-CM | POA: Diagnosis not present

## 2023-05-12 DIAGNOSIS — Z79899 Other long term (current) drug therapy: Secondary | ICD-10-CM | POA: Diagnosis not present

## 2023-05-16 DIAGNOSIS — L97411 Non-pressure chronic ulcer of right heel and midfoot limited to breakdown of skin: Secondary | ICD-10-CM | POA: Diagnosis not present

## 2023-05-18 ENCOUNTER — Telehealth: Payer: Self-pay | Admitting: Pharmacist

## 2023-05-18 NOTE — Telephone Encounter (Signed)
 Received Ohtuvayre new start paperwork. Completed form and faxed with clinicals and insurance card copy to [facility]   Phone#: [phone] Fax#: [phone]

## 2023-05-19 ENCOUNTER — Other Ambulatory Visit (HOSPITAL_COMMUNITY): Payer: Self-pay

## 2023-05-19 NOTE — Telephone Encounter (Signed)
 Received fax from [employer] with summary of benefits. Referral form for [facility] received. Rx will be triaged to [facility].. Once benefits investigation completed, pharmacy will reach out the patient to schedule shipment. If medication is unaffordable, patient will need to express financial hardship to be referred back to [place] [facility] for patient assistance program pre-screening.   Patient ID: [id number] Pharmacy phone: [phone] [place] [facility] Phone#: [phone]

## 2023-05-22 ENCOUNTER — Other Ambulatory Visit: Payer: Medicare Other

## 2023-05-22 DIAGNOSIS — J441 Chronic obstructive pulmonary disease with (acute) exacerbation: Secondary | ICD-10-CM | POA: Diagnosis not present

## 2023-05-22 DIAGNOSIS — F329 Major depressive disorder, single episode, unspecified: Secondary | ICD-10-CM | POA: Diagnosis not present

## 2023-05-22 DIAGNOSIS — J439 Emphysema, unspecified: Secondary | ICD-10-CM | POA: Diagnosis not present

## 2023-05-22 DIAGNOSIS — M810 Age-related osteoporosis without current pathological fracture: Secondary | ICD-10-CM | POA: Diagnosis not present

## 2023-05-23 ENCOUNTER — Ambulatory Visit
Admission: RE | Admit: 2023-05-23 | Discharge: 2023-05-23 | Discharge status: Home / Self Care | Source: Ambulatory Visit | Attending: Acute Care | Admitting: Acute Care

## 2023-05-23 DIAGNOSIS — Z122 Encounter for screening for malignant neoplasm of respiratory organs: Secondary | ICD-10-CM

## 2023-05-23 DIAGNOSIS — F17210 Nicotine dependence, cigarettes, uncomplicated: Secondary | ICD-10-CM | POA: Diagnosis not present

## 2023-05-23 DIAGNOSIS — Z87891 Personal history of nicotine dependence: Secondary | ICD-10-CM

## 2023-05-24 ENCOUNTER — Ambulatory Visit (INDEPENDENT_AMBULATORY_CARE_PROVIDER_SITE_OTHER): Admitting: Surgical

## 2023-05-24 ENCOUNTER — Other Ambulatory Visit: Payer: Self-pay

## 2023-05-24 ENCOUNTER — Other Ambulatory Visit (INDEPENDENT_AMBULATORY_CARE_PROVIDER_SITE_OTHER): Payer: Self-pay

## 2023-05-24 DIAGNOSIS — M19011 Primary osteoarthritis, right shoulder: Secondary | ICD-10-CM

## 2023-05-24 DIAGNOSIS — M25511 Pain in right shoulder: Secondary | ICD-10-CM

## 2023-05-24 NOTE — Progress Notes (Unsigned)
 Us/

## 2023-05-26 ENCOUNTER — Encounter: Payer: Self-pay | Admitting: Surgical

## 2023-05-26 MED ORDER — METHYLPREDNISOLONE ACETATE 40 MG/ML IJ SUSP
40.0000 mg | INTRAMUSCULAR | Status: AC | PRN
  Administered 2023-05-24: 40 mg via INTRA_ARTICULAR

## 2023-05-26 MED ORDER — BUPIVACAINE HCL 0.25 % IJ SOLN
9.0000 mL | INTRAMUSCULAR | Status: AC | PRN
  Administered 2023-05-24: 9 mL via INTRA_ARTICULAR

## 2023-05-26 MED ORDER — LIDOCAINE HCL 1 % IJ SOLN
5.0000 mL | INTRAMUSCULAR | Status: AC | PRN
  Administered 2023-05-24: 5 mL
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
## 2023-05-29 DIAGNOSIS — Z1231 Encounter for screening mammogram for malignant neoplasm of breast: Secondary | ICD-10-CM | POA: Diagnosis not present

## 2023-05-30 ENCOUNTER — Telehealth: Payer: Self-pay

## 2023-05-30 DIAGNOSIS — J42 Unspecified chronic bronchitis: Secondary | ICD-10-CM

## 2023-05-30 NOTE — Telephone Encounter (Signed)
 Copied from CRM [id number]. Topic: Clinical - Medication Question >> May 30, 2023 11:28 AM [name] wrote: Reason for CRM: Erica Richardson from [employer] calling to verify patient is on oxygen. Patient has requested a Portable Oxygen Concentrator.   Spoke with Erica Richardson from [employer]. Pt reached out to them stating she needs poc. Per lov pt qualified to POC but order was never placed. I have faxed POC order to [employer] as pt requested so she can get POC through them. Pt did not know she could get POC through [name].  Called and spoke with patient, she verified her DME company is [name] and she already has 2L continuous oxygen that she uses as needed. Pt informed me she purchased a POC through [facility] and is going to contact them to see if she can return it as she paid a lot for it. I am placing POC order to [name]. NFN

## 2023-05-31 ENCOUNTER — Ambulatory Visit: Admitting: Surgical

## 2023-05-31 DIAGNOSIS — L97411 Non-pressure chronic ulcer of right heel and midfoot limited to breakdown of skin: Secondary | ICD-10-CM | POA: Diagnosis not present

## 2023-05-31 NOTE — Telephone Encounter (Signed)
 Copied from CRM [id number]. Topic: Clinical - Medical Advice >> May 29, 2023  4:27 PM Chantha C wrote: Reason for CRM: [facility] from [employer] [phone] is checking if patient is on oxygen. Please advise and call back.  Spoke with Jonny Ruiz with from [employer] [phone].This message was taken care of yesterday .Nothing else further needed.

## 2023-06-06 DIAGNOSIS — E538 Deficiency of other specified B group vitamins: Secondary | ICD-10-CM | POA: Diagnosis not present

## 2023-06-06 DIAGNOSIS — Z79899 Other long term (current) drug therapy: Secondary | ICD-10-CM | POA: Diagnosis not present

## 2023-06-06 DIAGNOSIS — E559 Vitamin D deficiency, unspecified: Secondary | ICD-10-CM | POA: Diagnosis not present

## 2023-06-06 DIAGNOSIS — M549 Dorsalgia, unspecified: Secondary | ICD-10-CM | POA: Diagnosis not present

## 2023-06-06 DIAGNOSIS — G8929 Other chronic pain: Secondary | ICD-10-CM | POA: Diagnosis not present

## 2023-06-06 DIAGNOSIS — G4733 Obstructive sleep apnea (adult) (pediatric): Secondary | ICD-10-CM | POA: Diagnosis not present

## 2023-06-06 DIAGNOSIS — R7303 Prediabetes: Secondary | ICD-10-CM | POA: Diagnosis not present

## 2023-06-06 DIAGNOSIS — Z6822 Body mass index (BMI) 22.0-22.9, adult: Secondary | ICD-10-CM | POA: Diagnosis not present

## 2023-06-06 DIAGNOSIS — K5909 Other constipation: Secondary | ICD-10-CM | POA: Diagnosis not present

## 2023-06-07 ENCOUNTER — Encounter: Payer: Self-pay | Admitting: Internal Medicine

## 2023-06-07 ENCOUNTER — Encounter: Payer: Self-pay | Admitting: Physical Medicine and Rehabilitation

## 2023-06-07 ENCOUNTER — Telehealth: Payer: Self-pay | Admitting: *Deleted

## 2023-06-07 NOTE — Telephone Encounter (Signed)
 Copied from CRM [id number]. Topic: Clinical - Medical Advice >> Jun 07, 2023 11:37 AM Crist Dominion wrote: Reason for CRM: Patient is requesting a phone call back from [facility] nurse regarding her getting a small oxygen concentrator. Patient states someone was supposed to call her back a while back but no one has called.  Called and spoke with the pt  She is asking about when she should receive her POC from DME  We sent the order to [facility] and it is noted that they received this 06/01/23  I gave her their phone number so she could reach out to them and check her order status Nothing further needed at this time

## 2023-06-08 DIAGNOSIS — Z79899 Other long term (current) drug therapy: Secondary | ICD-10-CM | POA: Diagnosis not present

## 2023-06-10 DIAGNOSIS — J441 Chronic obstructive pulmonary disease with (acute) exacerbation: Secondary | ICD-10-CM | POA: Diagnosis not present

## 2023-06-10 DIAGNOSIS — J439 Emphysema, unspecified: Secondary | ICD-10-CM | POA: Diagnosis not present

## 2023-06-15 DIAGNOSIS — R051 Acute cough: Secondary | ICD-10-CM | POA: Diagnosis not present

## 2023-06-15 DIAGNOSIS — Z03818 Encounter for observation for suspected exposure to other biological agents ruled out: Secondary | ICD-10-CM | POA: Diagnosis not present

## 2023-06-21 DIAGNOSIS — F329 Major depressive disorder, single episode, unspecified: Secondary | ICD-10-CM | POA: Diagnosis not present

## 2023-06-21 DIAGNOSIS — M810 Age-related osteoporosis without current pathological fracture: Secondary | ICD-10-CM | POA: Diagnosis not present

## 2023-06-21 DIAGNOSIS — J439 Emphysema, unspecified: Secondary | ICD-10-CM | POA: Diagnosis not present

## 2023-06-21 DIAGNOSIS — J441 Chronic obstructive pulmonary disease with (acute) exacerbation: Secondary | ICD-10-CM | POA: Diagnosis not present

## 2023-06-25 DIAGNOSIS — R051 Acute cough: Secondary | ICD-10-CM | POA: Diagnosis not present

## 2023-06-28 ENCOUNTER — Other Ambulatory Visit: Payer: Self-pay

## 2023-06-28 DIAGNOSIS — Z122 Encounter for screening for malignant neoplasm of respiratory organs: Secondary | ICD-10-CM

## 2023-06-28 DIAGNOSIS — F17210 Nicotine dependence, cigarettes, uncomplicated: Secondary | ICD-10-CM

## 2023-06-28 DIAGNOSIS — Z87891 Personal history of nicotine dependence: Secondary | ICD-10-CM

## 2023-07-05 DIAGNOSIS — K5909 Other constipation: Secondary | ICD-10-CM | POA: Diagnosis not present

## 2023-07-05 DIAGNOSIS — M549 Dorsalgia, unspecified: Secondary | ICD-10-CM | POA: Diagnosis not present

## 2023-07-05 DIAGNOSIS — E538 Deficiency of other specified B group vitamins: Secondary | ICD-10-CM | POA: Diagnosis not present

## 2023-07-05 DIAGNOSIS — Z760 Encounter for issue of repeat prescription: Secondary | ICD-10-CM | POA: Diagnosis not present

## 2023-07-05 DIAGNOSIS — Z6822 Body mass index (BMI) 22.0-22.9, adult: Secondary | ICD-10-CM | POA: Diagnosis not present

## 2023-07-05 DIAGNOSIS — Z79899 Other long term (current) drug therapy: Secondary | ICD-10-CM | POA: Diagnosis not present

## 2023-07-05 DIAGNOSIS — E559 Vitamin D deficiency, unspecified: Secondary | ICD-10-CM | POA: Diagnosis not present

## 2023-07-05 DIAGNOSIS — R7303 Prediabetes: Secondary | ICD-10-CM | POA: Diagnosis not present

## 2023-07-05 DIAGNOSIS — Z780 Asymptomatic menopausal state: Secondary | ICD-10-CM | POA: Diagnosis not present

## 2023-07-05 DIAGNOSIS — G8929 Other chronic pain: Secondary | ICD-10-CM | POA: Diagnosis not present

## 2023-07-07 DIAGNOSIS — Z79899 Other long term (current) drug therapy: Secondary | ICD-10-CM | POA: Diagnosis not present

## 2023-07-10 DIAGNOSIS — J441 Chronic obstructive pulmonary disease with (acute) exacerbation: Secondary | ICD-10-CM | POA: Diagnosis not present

## 2023-07-10 DIAGNOSIS — J439 Emphysema, unspecified: Secondary | ICD-10-CM | POA: Diagnosis not present

## 2023-07-11 ENCOUNTER — Ambulatory Visit (INDEPENDENT_AMBULATORY_CARE_PROVIDER_SITE_OTHER): Admitting: Internal Medicine

## 2023-07-11 ENCOUNTER — Encounter: Payer: Self-pay | Admitting: Internal Medicine

## 2023-07-11 VITALS — BP 120/72 | HR 71 | Ht 64.0 in | Wt 128.8 lb

## 2023-07-11 DIAGNOSIS — Z87891 Personal history of nicotine dependence: Secondary | ICD-10-CM | POA: Diagnosis not present

## 2023-07-11 DIAGNOSIS — J9611 Chronic respiratory failure with hypoxia: Secondary | ICD-10-CM | POA: Diagnosis not present

## 2023-07-11 DIAGNOSIS — J439 Emphysema, unspecified: Secondary | ICD-10-CM

## 2023-07-11 DIAGNOSIS — J4489 Other specified chronic obstructive pulmonary disease: Secondary | ICD-10-CM | POA: Diagnosis not present

## 2023-07-11 NOTE — Patient Instructions (Signed)
 It was a pleasure to see you today!  Please schedule follow up with myself in 6 months.  If my schedule is not open yet, we will contact you with a reminder closer to that time. Please call [phone] if you haven't heard from us  a month before, and always call us  sooner if issues or concerns arise. You can also send us  a message through MyChart, but but aware that this is not to be used for urgent issues and it may take up to 5-7 days to receive a reply. Please be aware that you will likely be able to view your results before I have a chance to respond to them. Please give us  5 business days to respond to any non-urgent results.    Continue the symbicort  twice daily, and spiriva  once daily. Continue nebulized ensifentrine (ohtuvayre ). This is twice daily.   Continue albuterol  inhaler as needed.   CT scan looks good - no lung cancer. Next scan due in May 2026.  Congratulations on quitting smoking! Keep up the good work!  Let me know if you want to try the pulmonary rehab program.
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
## 2023-07-22 DIAGNOSIS — J439 Emphysema, unspecified: Secondary | ICD-10-CM | POA: Diagnosis not present

## 2023-07-22 DIAGNOSIS — F329 Major depressive disorder, single episode, unspecified: Secondary | ICD-10-CM | POA: Diagnosis not present

## 2023-07-22 DIAGNOSIS — M810 Age-related osteoporosis without current pathological fracture: Secondary | ICD-10-CM | POA: Diagnosis not present

## 2023-07-22 DIAGNOSIS — J441 Chronic obstructive pulmonary disease with (acute) exacerbation: Secondary | ICD-10-CM | POA: Diagnosis not present

## 2023-07-28 ENCOUNTER — Ambulatory Visit: Admitting: Surgical

## 2023-08-02 DIAGNOSIS — G8929 Other chronic pain: Secondary | ICD-10-CM | POA: Diagnosis not present

## 2023-08-02 DIAGNOSIS — M549 Dorsalgia, unspecified: Secondary | ICD-10-CM | POA: Diagnosis not present

## 2023-08-02 DIAGNOSIS — Z79899 Other long term (current) drug therapy: Secondary | ICD-10-CM | POA: Diagnosis not present

## 2023-08-07 DIAGNOSIS — Z79899 Other long term (current) drug therapy: Secondary | ICD-10-CM | POA: Diagnosis not present

## 2023-08-07 DIAGNOSIS — M216X9 Other acquired deformities of unspecified foot: Secondary | ICD-10-CM | POA: Diagnosis not present

## 2023-08-07 DIAGNOSIS — Q6689 Other  specified congenital deformities of feet: Secondary | ICD-10-CM | POA: Diagnosis not present

## 2023-08-07 DIAGNOSIS — M792 Neuralgia and neuritis, unspecified: Secondary | ICD-10-CM | POA: Diagnosis not present

## 2023-08-07 DIAGNOSIS — L84 Corns and callosities: Secondary | ICD-10-CM | POA: Diagnosis not present

## 2023-08-08 ENCOUNTER — Other Ambulatory Visit: Payer: Self-pay | Admitting: Physical Medicine and Rehabilitation

## 2023-08-08 DIAGNOSIS — M47817 Spondylosis without myelopathy or radiculopathy, lumbosacral region: Secondary | ICD-10-CM

## 2023-08-08 DIAGNOSIS — Z780 Asymptomatic menopausal state: Secondary | ICD-10-CM | POA: Diagnosis not present

## 2023-08-08 DIAGNOSIS — G8929 Other chronic pain: Secondary | ICD-10-CM

## 2023-08-08 DIAGNOSIS — Z7952 Long term (current) use of systemic steroids: Secondary | ICD-10-CM | POA: Diagnosis not present

## 2023-08-08 DIAGNOSIS — M8588 Other specified disorders of bone density and structure, other site: Secondary | ICD-10-CM | POA: Diagnosis not present

## 2023-08-08 DIAGNOSIS — M47816 Spondylosis without myelopathy or radiculopathy, lumbar region: Secondary | ICD-10-CM

## 2023-08-09 DIAGNOSIS — J441 Chronic obstructive pulmonary disease with (acute) exacerbation: Secondary | ICD-10-CM | POA: Diagnosis not present

## 2023-08-09 DIAGNOSIS — J439 Emphysema, unspecified: Secondary | ICD-10-CM | POA: Diagnosis not present

## 2023-08-14 ENCOUNTER — Encounter: Payer: Self-pay | Admitting: Surgical

## 2023-08-14 ENCOUNTER — Ambulatory Visit (INDEPENDENT_AMBULATORY_CARE_PROVIDER_SITE_OTHER): Admitting: Surgical

## 2023-08-14 ENCOUNTER — Other Ambulatory Visit (INDEPENDENT_AMBULATORY_CARE_PROVIDER_SITE_OTHER): Payer: Self-pay

## 2023-08-14 ENCOUNTER — Other Ambulatory Visit: Payer: Self-pay

## 2023-08-14 DIAGNOSIS — M25512 Pain in left shoulder: Secondary | ICD-10-CM

## 2023-08-14 DIAGNOSIS — M19011 Primary osteoarthritis, right shoulder: Secondary | ICD-10-CM | POA: Diagnosis not present

## 2023-08-14 DIAGNOSIS — G8929 Other chronic pain: Secondary | ICD-10-CM

## 2023-08-14 DIAGNOSIS — M19012 Primary osteoarthritis, left shoulder: Secondary | ICD-10-CM | POA: Diagnosis not present

## 2023-08-14 MED ORDER — BUPIVACAINE HCL 0.25 % IJ SOLN
9.0000 mL | INTRAMUSCULAR | Status: AC | PRN
  Administered 2023-08-14: 9 mL via INTRA_ARTICULAR

## 2023-08-14 MED ORDER — TRIAMCINOLONE ACETONIDE 40 MG/ML IJ SUSP
40.0000 mg | INTRAMUSCULAR | Status: AC | PRN
  Administered 2023-08-14: 40 mg via INTRA_ARTICULAR
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
## 2023-08-21 ENCOUNTER — Ambulatory Visit: Admitting: Surgical

## 2023-08-21 DIAGNOSIS — J439 Emphysema, unspecified: Secondary | ICD-10-CM | POA: Diagnosis not present

## 2023-08-21 DIAGNOSIS — M810 Age-related osteoporosis without current pathological fracture: Secondary | ICD-10-CM | POA: Diagnosis not present

## 2023-08-21 DIAGNOSIS — J441 Chronic obstructive pulmonary disease with (acute) exacerbation: Secondary | ICD-10-CM | POA: Diagnosis not present

## 2023-08-21 DIAGNOSIS — F329 Major depressive disorder, single episode, unspecified: Secondary | ICD-10-CM | POA: Diagnosis not present

## 2023-08-29 DIAGNOSIS — J449 Chronic obstructive pulmonary disease, unspecified: Secondary | ICD-10-CM | POA: Diagnosis not present

## 2023-08-29 DIAGNOSIS — E538 Deficiency of other specified B group vitamins: Secondary | ICD-10-CM | POA: Diagnosis not present

## 2023-08-29 DIAGNOSIS — R7303 Prediabetes: Secondary | ICD-10-CM | POA: Diagnosis not present

## 2023-08-29 DIAGNOSIS — Z79899 Other long term (current) drug therapy: Secondary | ICD-10-CM | POA: Diagnosis not present

## 2023-08-29 DIAGNOSIS — Z6822 Body mass index (BMI) 22.0-22.9, adult: Secondary | ICD-10-CM | POA: Diagnosis not present

## 2023-08-29 DIAGNOSIS — G8929 Other chronic pain: Secondary | ICD-10-CM | POA: Diagnosis not present

## 2023-08-29 DIAGNOSIS — Z760 Encounter for issue of repeat prescription: Secondary | ICD-10-CM | POA: Diagnosis not present

## 2023-08-29 DIAGNOSIS — M549 Dorsalgia, unspecified: Secondary | ICD-10-CM | POA: Diagnosis not present

## 2023-08-29 DIAGNOSIS — E559 Vitamin D deficiency, unspecified: Secondary | ICD-10-CM | POA: Diagnosis not present

## 2023-08-29 DIAGNOSIS — M129 Arthropathy, unspecified: Secondary | ICD-10-CM | POA: Diagnosis not present

## 2023-08-29 DIAGNOSIS — K5909 Other constipation: Secondary | ICD-10-CM | POA: Diagnosis not present

## 2023-09-01 DIAGNOSIS — Z79899 Other long term (current) drug therapy: Secondary | ICD-10-CM | POA: Diagnosis not present

## 2023-09-07 ENCOUNTER — Ambulatory Visit (INDEPENDENT_AMBULATORY_CARE_PROVIDER_SITE_OTHER): Admitting: Physical Medicine and Rehabilitation

## 2023-09-07 ENCOUNTER — Other Ambulatory Visit: Payer: Self-pay

## 2023-09-07 VITALS — BP 125/77 | HR 62

## 2023-09-07 DIAGNOSIS — M47816 Spondylosis without myelopathy or radiculopathy, lumbar region: Secondary | ICD-10-CM | POA: Diagnosis not present

## 2023-09-07 MED ORDER — METHYLPREDNISOLONE ACETATE 40 MG/ML IJ SUSP
40.0000 mg | Freq: Once | INTRAMUSCULAR | Status: AC
Start: 2023-09-07 — End: 2023-09-07
  Administered 2023-09-07: 40 mg

## 2023-09-07 NOTE — Progress Notes (Signed)
 Pain Scale   Average Pain 5 Patient advising she has lower back pain non-radiating, pain increases at night after medication wares off. Patient advised she has constant pain.        +Driver, -BT, -Dye Allergies.

## 2023-09-07 NOTE — Patient Instructions (Signed)

## 2023-09-08 DIAGNOSIS — J441 Chronic obstructive pulmonary disease with (acute) exacerbation: Secondary | ICD-10-CM | POA: Diagnosis not present

## 2023-09-08 DIAGNOSIS — J439 Emphysema, unspecified: Secondary | ICD-10-CM | POA: Diagnosis not present

## 2023-09-10 NOTE — Progress Notes (Signed)
 Erica Richardson - 73 y.o. female MRN [id number]  Date of birth: February 19, 1951  Office Visit Note: Visit Date: 09/07/2023 PCP: [name], FNP Referred by: [name], FNP  Subjective: Chief Complaint  Patient presents with   Lower Back - Pain   HPI:  Erica Richardson is a 73 y.o. female who comes in today at the request of Duwaine Pouch, FNP for planned Left  L3-4 and L5-S1 Lumbar facet/medial branch block with fluoroscopic guidance.  The patient has failed conservative care including home exercise, medications, time and activity modification.  This injection will be diagnostic and hopefully therapeutic.  Please see requesting physician notes for further details and justification.  Exam has shown concordant pain with facet joint loading.   ROS Otherwise per HPI.  Assessment & Plan: Visit Diagnoses:    ICD-10-CM   1. Spondylosis without myelopathy or radiculopathy, lumbar region  M47.816 XR C-ARM NO REPORT    Facet Injection    methylPREDNISolone  acetate (DEPO-MEDROL ) injection 40 mg      Plan: No additional findings.   Meds & Orders:  Meds ordered this encounter  Medications   methylPREDNISolone  acetate (DEPO-MEDROL ) injection 40 mg    Orders Placed This Encounter  Procedures   Facet Injection   XR C-ARM NO REPORT    Follow-up: Return for visit to requesting provider as needed.   Procedures: No procedures performed  Lumbar Facet Joint Intra-Articular Injection(s) with Fluoroscopic Guidance  Patient: Erica Richardson      Date of Birth: [birth year] MRN: [id number] PCP: [name], FNP      Visit Date: 09/07/2023   Universal Protocol:    Date/Time: 09/07/2023  Consent Given By: the patient  Position: PRONE   Additional Comments: Vital signs were monitored before and after the procedure. Patient was prepped and draped in the usual sterile fashion. The correct patient, procedure, and site was verified.   Injection Procedure Details:   Procedure Site One Meds Administered:  Meds ordered this encounter  Medications   methylPREDNISolone  acetate (DEPO-MEDROL ) injection 40 mg     Laterality: Left  Location/Site:  L3-L4 L5-S1  Needle size: 22 guage  Needle type: Spinal  Needle Placement: Articular  Findings:  -Comments: Excellent flow of contrast producing a partial arthrogram.  Procedure Details: The fluoroscope beam is vertically oriented in AP, and the inferior recess is visualized beneath the lower pole of the inferior apophyseal process, which represents the target point for needle insertion. When direct visualization is difficult the target point is located at the medial projection of the vertebral pedicle. The region overlying each aforementioned target is locally anesthetized with a 1 to 2 ml. volume of 1% Lidocaine  without Epinephrine.   The spinal needle was inserted into each of the above mentioned facet joints using biplanar fluoroscopic guidance. A 0.25 to 0.5 ml. volume of Isovue -250 was injected and a partial facet joint arthrogram was obtained. A single spot film was obtained of the resulting arthrogram.    One to 1.25 ml of the steroid/anesthetic solution was then injected into each of the facet joints noted above.   Additional Comments:  No complications occurred Dressing: 2 x 2 sterile gauze and Band-Aid    Post-procedure details: Patient was observed during the procedure. Post-procedure instructions were reviewed.  Patient left the clinic in stable condition.    Clinical History: MRI LUMBAR SPINE WITHOUT CONTRAST   TECHNIQUE: Multiplanar, multisequence MR imaging of the lumbar spine was performed. No intravenous contrast was administered.  COMPARISON:  Lumbar spine MRI 12/15/2017   FINDINGS: Segmentation: Standard; the lowest formed disc space is designated L5-S1.   Alignment:  Normal.   Vertebrae: Background marrow signal is normal. There is no suspicious marrow signal  abnormality or marrow edema. Again seen is incomplete posterior fusion of the posterior elements at L5 and S1.   Conus medullaris and cauda equina: Conus extends to the L5 level with cleft at the midline at the L3 level. The small lipoma at the S2-S3 level is unchanged. Consistent with a tethered cord.   Paraspinal and other soft tissues: There is mild perifacetal soft tissue edema on the left at L3-L4 and L4-L5. There is a small posteriorly projecting synovial cyst arising from the left L5-S1 facet joint.   Disc levels:   The disc heights are overall preserved   T12-L1: There is a shallow right paracentral disc protrusion/extrusion with slight inferior migration but no significant spinal canal or neural foraminal stenosis. The extrusion is new since 2019.   L1-L2: There is a new small right paracentral protrusion and mild facet arthropathy without significant spinal canal or neural foraminal stenosis.   L2-L3: There is a new small central protrusion and mild facet arthropathy without significant spinal canal or neural foraminal stenosis.   L3-L4: There is a mild disc bulge eccentric to the left and moderate left worse than right facet arthropathy resulting in mild-to-moderate left and mild right neural foraminal stenosis without significant spinal canal stenosis. The foraminal stenosis is worsened since 2019.   L4-L5: Mild left worse than right facet arthropathy without significant spinal canal or neural foraminal stenosis, not significantly changed.   L5-S1: There is moderate left and mild right facet arthropathy resulting in mild left worse than right neural foraminal stenosis without significant spinal canal stenosis, not significantly changed.   IMPRESSION: 1. Multilevel facet arthropathy, most advanced on the left at L3-L4 and L5-S1 with mild perifacetal soft tissue edema at L3-L4 and L4-L5, which may reflect a source of pain. 2. Mild-to-moderate left and mild  right neural foraminal stenosis at L3-L4, worsened since 2019. 3. Mild left worse than right neural foraminal stenosis at L5-S1, not significantly changed. 4. New small disc protrusions at T12-L1 through L2-L3 without significant spinal canal or neural foraminal stenosis. 5. Tethered cord extending to the L5 level, unchanged.     Electronically Signed   By: Maude Harry M.D.   On: 03/01/2022 10:50     Objective:  VS:  HT:    WT:   BMI:     BP:125/77  HR:62bpm  TEMP: ( )  RESP:  Physical Exam Vitals and nursing note reviewed.  Constitutional:      General: She is not in acute distress.    Appearance: Normal appearance. She is not ill-appearing.  HENT:     Head: Normocephalic and atraumatic.     Right Ear: External ear normal.     Left Ear: External ear normal.  Eyes:     Extraocular Movements: Extraocular movements intact.  Cardiovascular:     Rate and Rhythm: Normal rate.     Pulses: Normal pulses.  Pulmonary:     Effort: Pulmonary effort is normal. No respiratory distress.  Abdominal:     General: There is no distension.     Palpations: Abdomen is soft.  Musculoskeletal:        General: Tenderness present.     Cervical back: Neck supple.     Right lower leg: No edema.  Left lower leg: No edema.     Comments: Patient has good distal strength with no pain over the greater trochanters.  No clonus or focal weakness.  Skin:    Findings: No erythema, lesion or rash.  Neurological:     General: No focal deficit present.     Mental Status: She is alert and oriented to person, place, and time.     Sensory: No sensory deficit.     Motor: No weakness or abnormal muscle tone.     Coordination: Coordination normal.  Psychiatric:        Mood and Affect: Mood normal.        Behavior: Behavior normal.      Imaging: No results found.

## 2023-09-10 NOTE — Procedures (Signed)
 Lumbar Facet Joint Intra-Articular Injection(s) with Fluoroscopic Guidance  Patient: Erica Richardson      Date of Birth: 11/11/1950 MRN: [id number] PCP: [name], FNP      Visit Date: 09/07/2023   Universal Protocol:    Date/Time: 09/07/2023  Consent Given By: the patient  Position: PRONE   Additional Comments: Vital signs were monitored before and after the procedure. Patient was prepped and draped in the usual sterile fashion. The correct patient, procedure, and site was verified.   Injection Procedure Details:  Procedure Site One Meds Administered:  Meds ordered this encounter  Medications   methylPREDNISolone  acetate (DEPO-MEDROL ) injection 40 mg     Laterality: Left  Location/Site:  L3-L4 L5-S1  Needle size: 22 guage  Needle type: Spinal  Needle Placement: Articular  Findings:  -Comments: Excellent flow of contrast producing a partial arthrogram.  Procedure Details: The fluoroscope beam is vertically oriented in AP, and the inferior recess is visualized beneath the lower pole of the inferior apophyseal process, which represents the target point for needle insertion. When direct visualization is difficult the target point is located at the medial projection of the vertebral pedicle. The region overlying each aforementioned target is locally anesthetized with a 1 to 2 ml. volume of 1% Lidocaine  without Epinephrine.   The spinal needle was inserted into each of the above mentioned facet joints using biplanar fluoroscopic guidance. A 0.25 to 0.5 ml. volume of Isovue -250 was injected and a partial facet joint arthrogram was obtained. A single spot film was obtained of the resulting arthrogram.    One to 1.25 ml of the steroid/anesthetic solution was then injected into each of the facet joints noted above.   Additional Comments:  No complications occurred Dressing: 2 x 2 sterile gauze and Band-Aid    Post-procedure details: Patient was observed during the  procedure. Post-procedure instructions were reviewed.  Patient left the clinic in stable condition.

## 2023-09-21 DIAGNOSIS — J441 Chronic obstructive pulmonary disease with (acute) exacerbation: Secondary | ICD-10-CM | POA: Diagnosis not present

## 2023-09-21 DIAGNOSIS — J439 Emphysema, unspecified: Secondary | ICD-10-CM | POA: Diagnosis not present

## 2023-09-21 DIAGNOSIS — F329 Major depressive disorder, single episode, unspecified: Secondary | ICD-10-CM | POA: Diagnosis not present

## 2023-09-21 DIAGNOSIS — M810 Age-related osteoporosis without current pathological fracture: Secondary | ICD-10-CM | POA: Diagnosis not present

## 2023-09-28 ENCOUNTER — Encounter: Payer: Self-pay | Admitting: Physical Medicine and Rehabilitation

## 2023-09-29 DIAGNOSIS — E559 Vitamin D deficiency, unspecified: Secondary | ICD-10-CM | POA: Diagnosis not present

## 2023-09-29 DIAGNOSIS — Z6824 Body mass index (BMI) 24.0-24.9, adult: Secondary | ICD-10-CM | POA: Diagnosis not present

## 2023-09-29 DIAGNOSIS — J449 Chronic obstructive pulmonary disease, unspecified: Secondary | ICD-10-CM | POA: Diagnosis not present

## 2023-09-29 DIAGNOSIS — Q068 Other specified congenital malformations of spinal cord: Secondary | ICD-10-CM | POA: Diagnosis not present

## 2023-09-29 DIAGNOSIS — G8929 Other chronic pain: Secondary | ICD-10-CM | POA: Diagnosis not present

## 2023-09-29 DIAGNOSIS — Z760 Encounter for issue of repeat prescription: Secondary | ICD-10-CM | POA: Diagnosis not present

## 2023-09-29 DIAGNOSIS — M549 Dorsalgia, unspecified: Secondary | ICD-10-CM | POA: Diagnosis not present

## 2023-09-29 DIAGNOSIS — R059 Cough, unspecified: Secondary | ICD-10-CM | POA: Diagnosis not present

## 2023-09-29 DIAGNOSIS — K5909 Other constipation: Secondary | ICD-10-CM | POA: Diagnosis not present

## 2023-09-29 DIAGNOSIS — R7303 Prediabetes: Secondary | ICD-10-CM | POA: Diagnosis not present

## 2023-09-29 DIAGNOSIS — Z79899 Other long term (current) drug therapy: Secondary | ICD-10-CM | POA: Diagnosis not present

## 2023-09-29 DIAGNOSIS — E538 Deficiency of other specified B group vitamins: Secondary | ICD-10-CM | POA: Diagnosis not present

## 2023-10-03 DIAGNOSIS — Z79899 Other long term (current) drug therapy: Secondary | ICD-10-CM | POA: Diagnosis not present

## 2023-10-05 DIAGNOSIS — F17210 Nicotine dependence, cigarettes, uncomplicated: Secondary | ICD-10-CM | POA: Diagnosis not present

## 2023-10-05 DIAGNOSIS — Z0000 Encounter for general adult medical examination without abnormal findings: Secondary | ICD-10-CM | POA: Diagnosis not present

## 2023-10-05 DIAGNOSIS — E785 Hyperlipidemia, unspecified: Secondary | ICD-10-CM | POA: Diagnosis not present

## 2023-10-05 DIAGNOSIS — J439 Emphysema, unspecified: Secondary | ICD-10-CM | POA: Diagnosis not present

## 2023-10-05 DIAGNOSIS — M810 Age-related osteoporosis without current pathological fracture: Secondary | ICD-10-CM | POA: Diagnosis not present

## 2023-10-08 DIAGNOSIS — J439 Emphysema, unspecified: Secondary | ICD-10-CM | POA: Diagnosis not present

## 2023-10-08 DIAGNOSIS — J441 Chronic obstructive pulmonary disease with (acute) exacerbation: Secondary | ICD-10-CM | POA: Diagnosis not present

## 2023-10-09 DIAGNOSIS — M810 Age-related osteoporosis without current pathological fracture: Secondary | ICD-10-CM | POA: Diagnosis not present

## 2023-10-12 ENCOUNTER — Ambulatory Visit: Admitting: Surgical

## 2023-10-13 ENCOUNTER — Ambulatory Visit: Admitting: Surgical

## 2023-10-22 DIAGNOSIS — J439 Emphysema, unspecified: Secondary | ICD-10-CM | POA: Diagnosis not present

## 2023-10-22 DIAGNOSIS — F329 Major depressive disorder, single episode, unspecified: Secondary | ICD-10-CM | POA: Diagnosis not present

## 2023-10-22 DIAGNOSIS — M810 Age-related osteoporosis without current pathological fracture: Secondary | ICD-10-CM | POA: Diagnosis not present

## 2023-10-22 DIAGNOSIS — J441 Chronic obstructive pulmonary disease with (acute) exacerbation: Secondary | ICD-10-CM | POA: Diagnosis not present

## 2023-10-31 DIAGNOSIS — M549 Dorsalgia, unspecified: Secondary | ICD-10-CM | POA: Diagnosis not present

## 2023-10-31 DIAGNOSIS — M25552 Pain in left hip: Secondary | ICD-10-CM | POA: Diagnosis not present

## 2023-10-31 DIAGNOSIS — E559 Vitamin D deficiency, unspecified: Secondary | ICD-10-CM | POA: Diagnosis not present

## 2023-10-31 DIAGNOSIS — E538 Deficiency of other specified B group vitamins: Secondary | ICD-10-CM | POA: Diagnosis not present

## 2023-10-31 DIAGNOSIS — Z23 Encounter for immunization: Secondary | ICD-10-CM | POA: Diagnosis not present

## 2023-10-31 DIAGNOSIS — M25511 Pain in right shoulder: Secondary | ICD-10-CM | POA: Diagnosis not present

## 2023-10-31 DIAGNOSIS — M25512 Pain in left shoulder: Secondary | ICD-10-CM | POA: Diagnosis not present

## 2023-10-31 DIAGNOSIS — R0602 Shortness of breath: Secondary | ICD-10-CM | POA: Diagnosis not present

## 2023-10-31 DIAGNOSIS — G8929 Other chronic pain: Secondary | ICD-10-CM | POA: Diagnosis not present

## 2023-10-31 DIAGNOSIS — Z6824 Body mass index (BMI) 24.0-24.9, adult: Secondary | ICD-10-CM | POA: Diagnosis not present

## 2023-10-31 DIAGNOSIS — R7303 Prediabetes: Secondary | ICD-10-CM | POA: Diagnosis not present

## 2023-10-31 DIAGNOSIS — Z79899 Other long term (current) drug therapy: Secondary | ICD-10-CM | POA: Diagnosis not present

## 2023-11-02 DIAGNOSIS — L84 Corns and callosities: Secondary | ICD-10-CM | POA: Diagnosis not present

## 2023-11-02 DIAGNOSIS — M792 Neuralgia and neuritis, unspecified: Secondary | ICD-10-CM | POA: Diagnosis not present

## 2023-11-02 DIAGNOSIS — M216X9 Other acquired deformities of unspecified foot: Secondary | ICD-10-CM | POA: Diagnosis not present

## 2023-11-02 DIAGNOSIS — Z79899 Other long term (current) drug therapy: Secondary | ICD-10-CM | POA: Diagnosis not present

## 2023-11-02 DIAGNOSIS — Q6689 Other  specified congenital deformities of feet: Secondary | ICD-10-CM | POA: Diagnosis not present

## 2023-11-03 ENCOUNTER — Encounter: Payer: Self-pay | Admitting: Surgical

## 2023-11-03 ENCOUNTER — Ambulatory Visit (INDEPENDENT_AMBULATORY_CARE_PROVIDER_SITE_OTHER): Admitting: Surgical

## 2023-11-03 ENCOUNTER — Other Ambulatory Visit: Payer: Self-pay

## 2023-11-03 DIAGNOSIS — M25511 Pain in right shoulder: Secondary | ICD-10-CM | POA: Diagnosis not present

## 2023-11-03 DIAGNOSIS — M19011 Primary osteoarthritis, right shoulder: Secondary | ICD-10-CM | POA: Diagnosis not present

## 2023-11-03 DIAGNOSIS — M25512 Pain in left shoulder: Secondary | ICD-10-CM

## 2023-11-03 DIAGNOSIS — M19012 Primary osteoarthritis, left shoulder: Secondary | ICD-10-CM

## 2023-11-03 MED ORDER — TRIAMCINOLONE ACETONIDE 40 MG/ML IJ SUSP
40.0000 mg | INTRAMUSCULAR | Status: AC | PRN
  Administered 2023-11-03: 40 mg via INTRA_ARTICULAR

## 2023-11-03 MED ORDER — BUPIVACAINE HCL 0.25 % IJ SOLN
9.0000 mL | INTRAMUSCULAR | Status: AC | PRN
  Administered 2023-11-03: 9 mL via INTRA_ARTICULAR

## 2023-11-03 NOTE — Progress Notes (Signed)
   Procedure Note  Patient: Erica Richardson             Date of Birth: 1950/06/12           MRN: [id number]             Visit Date: 11/03/2023  Procedures: Visit Diagnoses:  1. Glenohumeral arthritis, right   2. Bilateral shoulder pain, unspecified chronicity   3. Glenohumeral arthritis, left     Large Joint Inj: R glenohumeral on 11/03/2023 3:14 PM Indications: pain and diagnostic evaluation Details: 22 G 3.5 in needle, ultrasound-guided posterior approach Medications: 9 mL bupivacaine  0.25 %; 40 mg triamcinolone  acetonide 40 MG/ML Outcome: tolerated well, no immediate complications Procedure, treatment alternatives, risks and benefits explained, specific risks discussed. Consent was given by the patient. Immediately prior to procedure a time out was called to verify the correct patient, procedure, equipment, support staff and site/side marked as required. Patient was prepped and draped in the usual sterile fashion.    Large Joint Inj: L glenohumeral on 11/03/2023 3:14 PM Indications: pain and diagnostic evaluation Details: 22 G 3.5 in needle, ultrasound-guided posterior approach Medications: 9 mL bupivacaine  0.25 %; 40 mg triamcinolone  acetonide 40 MG/ML Outcome: tolerated well, no immediate complications Procedure, treatment alternatives, risks and benefits explained, specific risks discussed. Consent was given by the patient. Immediately prior to procedure a time out was called to verify the correct patient, procedure, equipment, support staff and site/side marked as required. Patient was prepped and draped in the usual sterile fashion.     Patient here today for bilateral shoulder injection.  Right shoulder has history of arthritis.  Left shoulder also has history of arthritis but no MRI imaging.  She has history of rotator cuff pathology in the right shoulder that was treated with rotator cuff repair.  Rotator cuff function is intact but arthritis continues to be a big pain  generator for her.  Left shoulder is actually bothering her more.  If this injection today fails to resolve her left shoulder pain, plan for MRI of the left shoulder for further evaluation of extent of arthritis and rotator cuff pathology.  She did have increased glenohumeral effusion of the left shoulder relative to the right.

## 2023-11-06 DIAGNOSIS — M549 Dorsalgia, unspecified: Secondary | ICD-10-CM | POA: Diagnosis not present

## 2023-11-06 DIAGNOSIS — M25512 Pain in left shoulder: Secondary | ICD-10-CM | POA: Diagnosis not present

## 2023-11-06 DIAGNOSIS — M25511 Pain in right shoulder: Secondary | ICD-10-CM | POA: Diagnosis not present

## 2023-11-06 DIAGNOSIS — M25552 Pain in left hip: Secondary | ICD-10-CM | POA: Diagnosis not present

## 2023-11-07 DIAGNOSIS — J441 Chronic obstructive pulmonary disease with (acute) exacerbation: Secondary | ICD-10-CM | POA: Diagnosis not present

## 2023-11-07 DIAGNOSIS — J439 Emphysema, unspecified: Secondary | ICD-10-CM | POA: Diagnosis not present

## 2023-11-13 DIAGNOSIS — H35372 Puckering of macula, left eye: Secondary | ICD-10-CM | POA: Diagnosis not present

## 2023-11-18 DIAGNOSIS — J440 Chronic obstructive pulmonary disease with acute lower respiratory infection: Secondary | ICD-10-CM | POA: Diagnosis not present

## 2023-11-21 DIAGNOSIS — M810 Age-related osteoporosis without current pathological fracture: Secondary | ICD-10-CM | POA: Diagnosis not present

## 2023-11-21 DIAGNOSIS — J441 Chronic obstructive pulmonary disease with (acute) exacerbation: Secondary | ICD-10-CM | POA: Diagnosis not present

## 2023-11-21 DIAGNOSIS — F329 Major depressive disorder, single episode, unspecified: Secondary | ICD-10-CM | POA: Diagnosis not present

## 2023-11-21 DIAGNOSIS — J439 Emphysema, unspecified: Secondary | ICD-10-CM | POA: Diagnosis not present

## 2023-11-22 DIAGNOSIS — T148XXA Other injury of unspecified body region, initial encounter: Secondary | ICD-10-CM | POA: Diagnosis not present

## 2023-11-22 DIAGNOSIS — D225 Melanocytic nevi of trunk: Secondary | ICD-10-CM | POA: Diagnosis not present

## 2023-11-22 DIAGNOSIS — Z1283 Encounter for screening for malignant neoplasm of skin: Secondary | ICD-10-CM | POA: Diagnosis not present

## 2023-11-22 DIAGNOSIS — L578 Other skin changes due to chronic exposure to nonionizing radiation: Secondary | ICD-10-CM | POA: Diagnosis not present

## 2023-11-27 ENCOUNTER — Other Ambulatory Visit: Payer: Self-pay | Admitting: Primary Care

## 2023-11-28 DIAGNOSIS — Z6824 Body mass index (BMI) 24.0-24.9, adult: Secondary | ICD-10-CM | POA: Diagnosis not present

## 2023-11-28 DIAGNOSIS — M549 Dorsalgia, unspecified: Secondary | ICD-10-CM | POA: Diagnosis not present

## 2023-11-28 DIAGNOSIS — Z79899 Other long term (current) drug therapy: Secondary | ICD-10-CM | POA: Diagnosis not present

## 2023-11-28 DIAGNOSIS — G8929 Other chronic pain: Secondary | ICD-10-CM | POA: Diagnosis not present

## 2023-12-01 DIAGNOSIS — Z79899 Other long term (current) drug therapy: Secondary | ICD-10-CM | POA: Diagnosis not present

## 2023-12-04 DIAGNOSIS — Z23 Encounter for immunization: Secondary | ICD-10-CM | POA: Diagnosis not present

## 2023-12-07 DIAGNOSIS — J441 Chronic obstructive pulmonary disease with (acute) exacerbation: Secondary | ICD-10-CM | POA: Diagnosis not present

## 2023-12-07 DIAGNOSIS — J439 Emphysema, unspecified: Secondary | ICD-10-CM | POA: Diagnosis not present

## 2023-12-12 DIAGNOSIS — Z6823 Body mass index (BMI) 23.0-23.9, adult: Secondary | ICD-10-CM | POA: Diagnosis not present

## 2023-12-12 DIAGNOSIS — J40 Bronchitis, not specified as acute or chronic: Secondary | ICD-10-CM | POA: Diagnosis not present

## 2023-12-12 DIAGNOSIS — R051 Acute cough: Secondary | ICD-10-CM | POA: Diagnosis not present

## 2023-12-12 DIAGNOSIS — J441 Chronic obstructive pulmonary disease with (acute) exacerbation: Secondary | ICD-10-CM | POA: Diagnosis not present

## 2023-12-22 DIAGNOSIS — M810 Age-related osteoporosis without current pathological fracture: Secondary | ICD-10-CM | POA: Diagnosis not present

## 2023-12-22 DIAGNOSIS — J439 Emphysema, unspecified: Secondary | ICD-10-CM | POA: Diagnosis not present

## 2023-12-22 DIAGNOSIS — J441 Chronic obstructive pulmonary disease with (acute) exacerbation: Secondary | ICD-10-CM | POA: Diagnosis not present

## 2023-12-22 DIAGNOSIS — F329 Major depressive disorder, single episode, unspecified: Secondary | ICD-10-CM | POA: Diagnosis not present

## 2023-12-25 ENCOUNTER — Encounter: Payer: Self-pay | Admitting: Radiology

## 2023-12-26 ENCOUNTER — Encounter: Payer: Self-pay | Admitting: Primary Care

## 2023-12-26 ENCOUNTER — Ambulatory Visit: Admitting: Primary Care

## 2023-12-26 VITALS — BP 126/72 | HR 86 | Temp 96.3°F | Ht 64.0 in | Wt 138.0 lb

## 2023-12-26 DIAGNOSIS — F17200 Nicotine dependence, unspecified, uncomplicated: Secondary | ICD-10-CM

## 2023-12-26 DIAGNOSIS — R918 Other nonspecific abnormal finding of lung field: Secondary | ICD-10-CM | POA: Diagnosis not present

## 2023-12-26 DIAGNOSIS — J9610 Chronic respiratory failure, unspecified whether with hypoxia or hypercapnia: Secondary | ICD-10-CM

## 2023-12-26 DIAGNOSIS — J439 Emphysema, unspecified: Secondary | ICD-10-CM

## 2023-12-26 DIAGNOSIS — F17210 Nicotine dependence, cigarettes, uncomplicated: Secondary | ICD-10-CM

## 2023-12-26 DIAGNOSIS — J449 Chronic obstructive pulmonary disease, unspecified: Secondary | ICD-10-CM | POA: Diagnosis not present

## 2023-12-26 MED ORDER — PREDNISONE 10 MG PO TABS: ORAL_TABLET | ORAL | 0 refills | Status: AC

## 2023-12-26 MED ORDER — BENZONATATE 100 MG PO CAPS: 100.0000 mg | ORAL_CAPSULE | Freq: Three times a day (TID) | ORAL | 0 refills | Status: DC | PRN

## 2023-12-26 MED ORDER — BREZTRI AEROSPHERE 160-9-4.8 MCG/ACT IN AERO: 2.0000 | INHALATION_SPRAY | Freq: Two times a day (BID) | RESPIRATORY_TRACT | Status: DC

## 2023-12-26 MED ORDER — BREZTRI AEROSPHERE 160-9-4.8 MCG/ACT IN AERO: 2.0000 | INHALATION_SPRAY | Freq: Two times a day (BID) | RESPIRATORY_TRACT | 3 refills | Status: DC

## 2023-12-26 NOTE — Patient Instructions (Addendum)
    VISIT SUMMARY: During your visit, we discussed your worsening respiratory symptoms related to COPD and chronic respiratory failure. You reported increased shortness of breath and cough, which improve with prednisone . We also addressed your recent smoking relapse and your plans to quit smoking again. Additionally, we reviewed your current medications and vaccination status.  YOUR PLAN: -CHRONIC OBSTRUCTIVE PULMONARY DISEASE WITH CHRONIC RESPIRATORY FAILURE: COPD is a chronic lung disease that makes it hard to breathe. Your symptoms have worsened, and we discussed the risks of long-term prednisone  use. You will start using the Breztri inhaler with two puffs in the morning and evening, continue using the Otuver nebulizer, and monitor your rescue inhaler use. If you need the rescue inhaler more than four times a day, we will reassess your treatment.  -TOBACCO USE DISORDER: Tobacco use disorder is a dependence on tobacco products. You have recently relapsed and are smoking about a pack a day. We discussed using Wellbutrin and nicotine  patches to help you quit. You should set a quit date and gradually reduce your smoking.  -EMPHYSEMA AND SMOKING-RELATED CHRONIC BRONCHITIS: Emphysema and chronic bronchitis are lung conditions often caused by smoking, leading to cough and difficulty breathing. Quitting smoking is crucial to improve your symptoms.  -STABLE PULMONARY NODULES: Pulmonary nodules are small growths in the lungs. Your previous CT scan showed stable nodules without suspicious features. We will schedule a repeat CT scan for lung cancer screening in May 2026.  -COUGH: You have a persistent cough that improves with prednisone . We have prescribed Tessalon  Perles to help relieve your cough symptoms.  INSTRUCTIONS: Please start using the Breztri inhaler tomorrow with two puffs in the morning and evening. Continue using the Ohtuvayre  nebulizer and monitor your use of the rescue inhaler. If you use the  rescue inhaler more than four times a day, please contact us  to reassess your treatment. Begin taking Wellbutrin for smoking cessation and consider using nicotine  patches or gum. Set a quit date and gradually reduce your smoking. We will schedule a repeat CT scan for lung cancer screening in May 2026.  Follow-up New patient visit with Dr. Adrien or Pleas in 4-6 weeks (30 min)/ if nothing open I will see patient back
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
## 2023-12-29 DIAGNOSIS — M25512 Pain in left shoulder: Secondary | ICD-10-CM | POA: Diagnosis not present

## 2023-12-29 DIAGNOSIS — Z6823 Body mass index (BMI) 23.0-23.9, adult: Secondary | ICD-10-CM | POA: Diagnosis not present

## 2023-12-29 DIAGNOSIS — K5909 Other constipation: Secondary | ICD-10-CM | POA: Diagnosis not present

## 2023-12-29 DIAGNOSIS — Z79899 Other long term (current) drug therapy: Secondary | ICD-10-CM | POA: Diagnosis not present

## 2023-12-29 DIAGNOSIS — R7303 Prediabetes: Secondary | ICD-10-CM | POA: Diagnosis not present

## 2023-12-29 DIAGNOSIS — M25552 Pain in left hip: Secondary | ICD-10-CM | POA: Diagnosis not present

## 2023-12-29 DIAGNOSIS — M25511 Pain in right shoulder: Secondary | ICD-10-CM | POA: Diagnosis not present

## 2023-12-29 DIAGNOSIS — E559 Vitamin D deficiency, unspecified: Secondary | ICD-10-CM | POA: Diagnosis not present

## 2023-12-29 DIAGNOSIS — M549 Dorsalgia, unspecified: Secondary | ICD-10-CM | POA: Diagnosis not present

## 2023-12-29 DIAGNOSIS — E538 Deficiency of other specified B group vitamins: Secondary | ICD-10-CM | POA: Diagnosis not present

## 2023-12-29 DIAGNOSIS — G8929 Other chronic pain: Secondary | ICD-10-CM | POA: Diagnosis not present

## 2024-01-02 DIAGNOSIS — Z79899 Other long term (current) drug therapy: Secondary | ICD-10-CM | POA: Diagnosis not present

## 2024-01-05 DIAGNOSIS — J449 Chronic obstructive pulmonary disease, unspecified: Secondary | ICD-10-CM | POA: Diagnosis not present

## 2024-01-06 DIAGNOSIS — J441 Chronic obstructive pulmonary disease with (acute) exacerbation: Secondary | ICD-10-CM | POA: Diagnosis not present

## 2024-01-06 DIAGNOSIS — J439 Emphysema, unspecified: Secondary | ICD-10-CM | POA: Diagnosis not present

## 2024-01-08 ENCOUNTER — Telehealth: Payer: Self-pay

## 2024-01-08 NOTE — Telephone Encounter (Signed)
 Copied from CRM #[id number]. Topic: Clinical - Medication Question >> Jan 08, 2024  1:58 PM Russell PARAS wrote: Reason for CRM:   Pt is contacting clinic due to issue with recent prescribing of Breztri. She has been taking medication for over a week and has not seen an improvement. She would like to know if there is any other alternate treatments she can try  CB#  [phone]   Called and spoke with the pt. Pt states she is having on going SOB & having to use rescue inhaler daily. Pt states during lov Symbicort  was mentioned and she would like to know if this would help her better.  Beth can you please advise.

## 2024-01-15 ENCOUNTER — Encounter (HOSPITAL_BASED_OUTPATIENT_CLINIC_OR_DEPARTMENT_OTHER): Payer: Self-pay

## 2024-01-15 ENCOUNTER — Other Ambulatory Visit: Payer: Self-pay

## 2024-01-15 ENCOUNTER — Ambulatory Visit: Payer: Self-pay

## 2024-01-15 ENCOUNTER — Emergency Department (HOSPITAL_BASED_OUTPATIENT_CLINIC_OR_DEPARTMENT_OTHER): Admitting: Radiology

## 2024-01-15 ENCOUNTER — Emergency Department (HOSPITAL_BASED_OUTPATIENT_CLINIC_OR_DEPARTMENT_OTHER)
Admission: EM | Admit: 2024-01-15 | Discharge: 2024-01-15 | Discharge status: Against Medical Advice | Attending: Emergency Medicine | Admitting: Emergency Medicine

## 2024-01-15 DIAGNOSIS — R0602 Shortness of breath: Secondary | ICD-10-CM | POA: Insufficient documentation

## 2024-01-15 DIAGNOSIS — R000 Tachycardia, unspecified: Secondary | ICD-10-CM | POA: Insufficient documentation

## 2024-01-15 DIAGNOSIS — Z5321 Procedure and treatment not carried out due to patient leaving prior to being seen by health care provider: Secondary | ICD-10-CM | POA: Insufficient documentation

## 2024-01-15 LAB — RESP PANEL BY RT-PCR (RSV, FLU A&B, COVID)  RVPGX2
Influenza A by PCR: NEGATIVE
Influenza B by PCR: NEGATIVE
Resp Syncytial Virus by PCR: NEGATIVE
SARS Coronavirus 2 by RT PCR: NEGATIVE

## 2024-01-15 NOTE — ED Notes (Signed)
 X2 called to room, no answer

## 2024-01-15 NOTE — Telephone Encounter (Signed)
 FYI Only or Action Required?: FYI only for provider: ED advised.  Patient is followed in Pulmonology for COPD, last seen on 12/26/2023 by Hope Almarie ORN, NP.  Called Nurse Triage reporting Shortness of Breath.  Symptoms began several weeks ago.  Interventions attempted: Prescription medications: breztri , symbicort , Rescue inhaler, Maintenance inhaler, and Home oxygen  use.  Symptoms are: gradually worsening.  Triage Disposition: Go to ED Now (Notify PCP)  Patient/caregiver understands and will follow disposition?: Yes   Copied from CRM [id number]. Topic: Clinical - Red Word Triage >> Jan 15, 2024 11:51 AM Celestine FALCON wrote: Red Word that prompted transfer to Nurse Triage: Pt is having difficulty breathing with SOB and hasn't been feeling well since last week. Pt started Breztri  for over a week with no improvement and wanted to know if there were alternatives and/or if she should remain taking this medication. The CRM was sent to the clinical staff and forwarded to NP Almarie Hope on 11/17 with no response.   Pt has an appt scheduled with Dr. Adrien Guan on 12/2 at 130pm at the [facility] clinic. Reason for Disposition  [1] MODERATE difficulty breathing (e.g., speaks in phrases, SOB even at rest, pulse 100-120) AND [2] NEW-onset or WORSE than normal  Answer Assessment - Initial Assessment Questions Pt states she was seen by NP Hope a couple of weeks ago and was switched from the symbicort  to breztri . She thought at the time that the symbicort  wasn't helping. She states maybe now she just needs to go back to the symbicort  at a higher dosage. She states she has been having worsening shortness of breath. She states her pulse ox is in the low 90's and her HR is in the low 100's to 110. She has reduced the albuterol  to just once a day due to the shakiness she is feeling. She states oxygen  level is lower in the mornings and then gets slightly better after the breztri . She states she did  just complete a round of prednisone . She states her shortness of breath is worse since seeing her. She typically wears oxygen  at night but has been wearing it during the day too. She states she just doesn't feel well and this has been going on for too long. And she messaged the office last week with no response. Pt states she is nauseated, having no energy, feels shaky on the inside, just doesn't feel right.  Currently O2 97% on 2.5L HR 100.  RN advised due to increased need for oxygen  and worsening shortness of breath, to go to the ER. She does have someone to take her to the Er. She denies any chest pain at this time.      1. RESPIRATORY STATUS: Describe your breathing? (e.g., wheezing, shortness of breath, unable to speak, severe coughing)      Worsening shortness of breath 2. ONSET: When did this breathing problem begin?      About 3 weeks ago, but worse since last OV 3. PATTERN Does the difficult breathing come and go, or has it been constant since it started?      Pretty constant 4. SEVERITY: How bad is your breathing? (e.g., mild, moderate, severe)      Moderate to severe 5. RECURRENT SYMPTOM: Have you had difficulty breathing before? If Yes, ask: When was the last time? and What happened that time?     Does have COPD, but this is worse than normal 6. CARDIAC HISTORY: Do you have any history of heart disease? (e.g., heart attack,  angina, bypass surgery, angioplasty)      no 7. LUNG HISTORY: Do you have any history of lung disease?  (e.g., pulmonary embolus, asthma, emphysema)     copd 8. CAUSE: What do you think is causing the breathing problem?      unknown 9. OTHER SYMPTOMS: Do you have any other symptoms? (e.g., chest pain, cough, dizziness, fever, runny nose)     No chest pain, yes cough 10. O2 SATURATION MONITOR:  Do you use an oxygen  saturation monitor (pulse oximeter) at home? If Yes, ask: What is your reading (oxygen  level) today? What is your usual  oxygen  saturation reading? (e.g., 95%)       975 on 2.5L  Protocols used: Breathing Difficulty-A-AH

## 2024-01-15 NOTE — ED Triage Notes (Signed)
 Pt c/o SHOB, tachycardia onset approx 2wks ago, it's just staying up. The oxygen  is usually 92, pulse is over 102-108  Inhaler PTA w relief.

## 2024-01-15 NOTE — Telephone Encounter (Signed)
Looks like she went to ED

## 2024-01-15 NOTE — ED Notes (Signed)
 X1 called to room, no answer

## 2024-01-15 NOTE — ED Notes (Signed)
 X3 called to room, no answer

## 2024-01-15 NOTE — Telephone Encounter (Signed)
 She is current in ED at [facility], follow-up with us  this week or next If I have no openings can use same day slot or check [facility]

## 2024-01-16 DIAGNOSIS — G479 Sleep disorder, unspecified: Secondary | ICD-10-CM | POA: Diagnosis not present

## 2024-01-16 DIAGNOSIS — R000 Tachycardia, unspecified: Secondary | ICD-10-CM | POA: Diagnosis not present

## 2024-01-16 DIAGNOSIS — J439 Emphysema, unspecified: Secondary | ICD-10-CM | POA: Diagnosis not present

## 2024-01-16 DIAGNOSIS — R0602 Shortness of breath: Secondary | ICD-10-CM | POA: Diagnosis not present

## 2024-01-16 DIAGNOSIS — J441 Chronic obstructive pulmonary disease with (acute) exacerbation: Secondary | ICD-10-CM | POA: Diagnosis not present

## 2024-01-16 NOTE — Telephone Encounter (Signed)
 Pt has ov 12/2 with Dr. Adrien

## 2024-01-21 DIAGNOSIS — M810 Age-related osteoporosis without current pathological fracture: Secondary | ICD-10-CM | POA: Diagnosis not present

## 2024-01-21 DIAGNOSIS — J439 Emphysema, unspecified: Secondary | ICD-10-CM | POA: Diagnosis not present

## 2024-01-21 DIAGNOSIS — J441 Chronic obstructive pulmonary disease with (acute) exacerbation: Secondary | ICD-10-CM | POA: Diagnosis not present

## 2024-01-21 DIAGNOSIS — F329 Major depressive disorder, single episode, unspecified: Secondary | ICD-10-CM | POA: Diagnosis not present

## 2024-01-22 NOTE — Assessment & Plan Note (Deleted)
 Worsening symptoms with increased dyspnea and cough. Symptoms improve with prednisone  but worsen upon cessation. Wheezing present on examination. Current treatment includes Symbicort  and Ohtuvayre  nebulizer. Discussed risks of long-term prednisone  use, including drug-induced diabetes, hyperglycemia, osteoporosis, and immunosuppression. Consideration of increasing    Symbicort  and Ohtuvayre  nebulizer.
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
## 2024-01-23 ENCOUNTER — Encounter

## 2024-01-23 DIAGNOSIS — J449 Chronic obstructive pulmonary disease, unspecified: Secondary | ICD-10-CM

## 2024-01-26 MED ORDER — BUDESONIDE-FORMOTEROL FUMARATE 160-4.5 MCG/ACT IN AERO: 2.0000 | INHALATION_SPRAY | Freq: Two times a day (BID) | RESPIRATORY_TRACT | 1 refills | Status: AC

## 2024-01-26 MED ORDER — BUDESONIDE-FORMOTEROL FUMARATE 160-4.5 MCG/ACT IN AERO: 2.0000 | INHALATION_SPRAY | Freq: Two times a day (BID) | RESPIRATORY_TRACT | 1 refills | Status: DC

## 2024-01-26 NOTE — Telephone Encounter (Signed)
 Copied from CRM #[id number]. Topic: Clinical - Prescription Issue >> Jan 25, 2024  2:23 PM Erica Richardson wrote: Reason for CRM: Patient states the Breztri  inhaler has been making her sick and is requesting Almarie Ferrari to please order her a higher dosage Symbicort  as soon as possible as she is worried about going without an inhaler that works for her .  Please send this to: [address] [place] [place] [address] Phone: [phone] Fax: [phone]   Called and spoke to pt - states that she took the Breztri  for one week and felt really really sick. After stopping the Breztri , her sickness subsided.  Pt is requesting higher dosage Symbicort  in place of the Breztri . Beth, please advise. Thank you!

## 2024-01-26 NOTE — Telephone Encounter (Signed)
 Of course, I will send in to [facility] pharmacy
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
## 2024-01-29 ENCOUNTER — Other Ambulatory Visit: Payer: Self-pay

## 2024-01-29 ENCOUNTER — Ambulatory Visit

## 2024-01-29 VITALS — BP 122/75 | HR 95 | Temp 98.1°F | Ht 64.0 in | Wt 137.0 lb

## 2024-01-29 DIAGNOSIS — E538 Deficiency of other specified B group vitamins: Secondary | ICD-10-CM | POA: Diagnosis not present

## 2024-01-29 DIAGNOSIS — J449 Chronic obstructive pulmonary disease, unspecified: Secondary | ICD-10-CM

## 2024-01-29 DIAGNOSIS — M25511 Pain in right shoulder: Secondary | ICD-10-CM | POA: Diagnosis not present

## 2024-01-29 DIAGNOSIS — F17210 Nicotine dependence, cigarettes, uncomplicated: Secondary | ICD-10-CM

## 2024-01-29 DIAGNOSIS — K5909 Other constipation: Secondary | ICD-10-CM | POA: Diagnosis not present

## 2024-01-29 DIAGNOSIS — R7303 Prediabetes: Secondary | ICD-10-CM | POA: Diagnosis not present

## 2024-01-29 DIAGNOSIS — M25512 Pain in left shoulder: Secondary | ICD-10-CM | POA: Diagnosis not present

## 2024-01-29 DIAGNOSIS — F17200 Nicotine dependence, unspecified, uncomplicated: Secondary | ICD-10-CM

## 2024-01-29 DIAGNOSIS — Z79899 Other long term (current) drug therapy: Secondary | ICD-10-CM | POA: Diagnosis not present

## 2024-01-29 DIAGNOSIS — R0902 Hypoxemia: Secondary | ICD-10-CM

## 2024-01-29 DIAGNOSIS — E559 Vitamin D deficiency, unspecified: Secondary | ICD-10-CM | POA: Diagnosis not present

## 2024-01-29 DIAGNOSIS — G8929 Other chronic pain: Secondary | ICD-10-CM | POA: Diagnosis not present

## 2024-01-29 DIAGNOSIS — M25552 Pain in left hip: Secondary | ICD-10-CM | POA: Diagnosis not present

## 2024-01-29 DIAGNOSIS — M549 Dorsalgia, unspecified: Secondary | ICD-10-CM | POA: Diagnosis not present

## 2024-01-29 DIAGNOSIS — Z6822 Body mass index (BMI) 22.0-22.9, adult: Secondary | ICD-10-CM | POA: Diagnosis not present

## 2024-01-29 MED ORDER — ALBUTEROL SULFATE (2.5 MG/3ML) 0.083% IN NEBU: 2.5000 mg | INHALATION_SOLUTION | Freq: Four times a day (QID) | RESPIRATORY_TRACT | 4 refills | Status: DC | PRN

## 2024-01-29 MED ORDER — PREDNISONE 10 MG PO TABS: 40.0000 mg | ORAL_TABLET | Freq: Every day | ORAL | 0 refills | Status: AC

## 2024-01-29 NOTE — Patient Instructions (Addendum)
 Dear Erica Richardson;   I will recommend the following -Continue Symbicort  and Spiriva . -Continue Ohtuvayre  nebulizer. -Add albuterol  nebulizer twice a day for the next 2 weeks.  -If your oxygen  drops <88%, please use your oxygen  2.5 L. -I will send you a prescription of 5 day prednisone  40 mg.  -I highly recommend you to decrease smoking over time. I sent a referral to the smoking cessation clinic as well.  -I recommend to exercise and do 3 laps a day with oxygen .  I will see you in 4 weeks.

## 2024-01-31 DIAGNOSIS — Z79899 Other long term (current) drug therapy: Secondary | ICD-10-CM | POA: Diagnosis not present

## 2024-02-04 ENCOUNTER — Other Ambulatory Visit: Payer: Self-pay | Admitting: Primary Care

## 2024-02-05 ENCOUNTER — Other Ambulatory Visit: Payer: Self-pay | Admitting: Primary Care

## 2024-02-05 NOTE — Telephone Encounter (Unsigned)
 Copied from CRM #[id number]. Topic: Clinical - Medication Refill >> Feb 05, 2024 11:30 AM Devaughn RAMAN wrote: Medication: benzonatate  (TESSALON ) 100 MG capsule  Has the patient contacted their pharmacy? Yes (Agent: If no, request that the patient contact the pharmacy for the refill. If patient does not wish to contact the pharmacy document the reason why and proceed with request.) (Agent: If yes, when and what did the pharmacy advise?)  This is the patient's preferred pharmacy:   [id number] [place], [place] - [address] [address] [place]  [address] Phone: [phone] Fax: [phone]  Is this the correct pharmacy for this prescription? Yes If no, delete pharmacy and type the correct one.   Has the prescription been filled recently? No, 11.4.25  Is the patient out of the medication? Yes  Has the patient been seen for an appointment in the last year OR does the patient have an upcoming appointment? Yes  Can we respond through MyChart? Yes  Agent: Please be advised that Rx refills may take up to 3 business days. We ask that you follow-up with your pharmacy.

## 2024-02-08 ENCOUNTER — Telehealth: Payer: Self-pay

## 2024-02-08 NOTE — Telephone Encounter (Signed)
 Copied from CRM #[id number]. Topic: Clinical - Medication Question >> Feb 08, 2024 10:26 AM Devaughn RAMAN wrote: Reason for CRM: Pt would like more prednisone  regarding her cough. Pt stated she still has a cough and she would like more prednisone  to assist with the cough.   Called and spoke to pt. Pt states she feels that her cough has improved a little bit and she is requesting another course of prednisone , as prednisone  helped.   [facility].   Dr. Adrien, please advise.

## 2024-02-09 ENCOUNTER — Other Ambulatory Visit: Payer: Self-pay

## 2024-02-09 NOTE — Telephone Encounter (Signed)
 I called and spoke with the pt and she verbalized understanding to Dr. Adrien note. Pt would like to continue with Tessalon . We sent rx for this 12/15 and the pt states she never picked rx up. I advised pt to call pharmacy for details. We unfortunately do not have any openings for today and I advised pt to go to ED or UC over the weekend if her cough isnt improving or if she notices any chest pain. Pt will hold off on prednisone  for now and will keep scheduled appt.

## 2024-02-13 MED ORDER — BENZONATATE 100 MG PO CAPS: 100.0000 mg | ORAL_CAPSULE | Freq: Three times a day (TID) | ORAL | 0 refills | Status: DC | PRN

## 2024-02-13 NOTE — Telephone Encounter (Signed)
 Copied from CRM #[id number]. Topic: Clinical - Prescription Issue >> Feb 12, 2024  2:11 PM Ismael A wrote: Reason for CRM: pt states her refill for benzonatate  (TESSALON ) 100 MG capsule - has not been received by the pharmacy, although on our end it states receipt confirmed by pharmacy - Outpatient Medication Detail   Disp Refills Start End  benzonatate  (TESSALON ) 100 MG capsule 60 capsule 0 02/05/2024 -  Sig: TAKE 1 CAPSULE BY MOUTH THREE TIMES A DAY AS NEEDED  Sent to pharmacy as: benzonatate  (TESSALON ) 100 MG capsule  E-Prescribing Status: Receipt confirmed by pharmacy (02/05/2024 12:46 PM EST) - please follow up on RX   I tried calling the pharmacy and was left on hold for 12 minutes as the pharmacists was confused to the prescription.  I have resent rx.

## 2024-02-23 ENCOUNTER — Ambulatory Visit: Payer: Self-pay

## 2024-02-23 NOTE — Telephone Encounter (Signed)
 FYI Only or Action Required?: FYI only for provider: nebulizer treatment advised, if no improvement proceed to ED.  Patient is followed in Pulmonology for COPD, last seen on 01/29/2024 by Adrien Guan, Tamala, MD.  Called Nurse Triage reporting Breathing Problem.  Symptoms began yesterday.  Interventions attempted: Rescue inhaler, Maintenance inhaler, and Home oxygen  use.  Symptoms are: unchanged.  Triage Disposition: Urgent Home Treatment With Follow-up Call  Patient/caregiver understands and will follow disposition?: Unsure but will call back if no improvement   Reason for Disposition  Nurse judgment    Patient advised to proceed to ED if no improvement on nebulizer.  Answer Assessment - Initial Assessment Questions Additional info:  She is a smoker. Trying to quit.  Patient called in to requests prednisone  for shortness of breath, she is on 2.5 liters of oxygen  at baseline, current o2 93%, she is using her controller inhaler as prescribed, needed prn albuterol  X 1 today but feels she is still short of breath. She has not tried her nebulizer at this time. She is also experiencing bilateral shoulder pain. She states she will not go to ED if asked to go.   Plan: Patient will administer nebulizer, if no improvement advised to proceed to ED. Patient is unsure if she will go, agreeable to call back NT if no improvement on nebulizer treatment.    [facility] Pulmonary Triage - Initial Assessment Questions Chief Complaint (e.g., cough, sob, wheezing, fever, chills, sweat or additional symptoms) *Go to specific symptom protocol after initial questions. Feels short of breath.   How long have symptoms been present? Started late yesterday.    Have you tested for COVID or Flu? Note: If not, ask patient if a home test can be taken. If so, instruct patient to call back for positive results. No  MEDICINES:   Have you used any OTC meds to help with symptoms? Yes If yes, ask What  medications? Tylenol  for shoulder pain  Have you used your inhalers/maintenance medication? Yes If yes, What medications? Symbicort  Has not tried nebulizer   If inhaler, ask How many puffs and how often? Note: Review instructions on medication in the chart. Albuterol  once today   OXYGEN : Do you wear supplemental oxygen ? Yes If yes, How many liters are you supposed to use? 2.5 liters-baseline   Do you monitor your oxygen  levels? Yes If yes, What is your reading (oxygen  level) today? 93-94%  What is your usual oxygen  saturation reading?  (Note: Pulmonary O2 sats should be 90% or greater) 94%     BREATHING DIFFICULTY: Are you having any difficulty breathing? If Yes, ask: How bad is it?  (e.g., none, mild, moderate, severe)      Yes, feels short of breath  9. OTHER SYMPTOMS: Do you have any other symptoms? (e.g., fever, change in sputum)     shoulder pain-Tylenol   Protocols used: COPD Oxygen  Monitoring and Hypoxia-A-AH Copied from CRM #[id number]. Topic: Clinical - Red Word Triage >> Feb 23, 2024  2:08 PM Corean SAUNDERS wrote: Red Word that prompted transfer to Nurse Triage: Breathing problems

## 2024-02-26 NOTE — Telephone Encounter (Signed)
 Noted.  She has a scheduled f/u with provider on 02/29/24.  Nothing further needed.
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
## 2024-02-29 ENCOUNTER — Ambulatory Visit

## 2024-03-01 ENCOUNTER — Telehealth: Admitting: Physician Assistant

## 2024-03-01 DIAGNOSIS — Z5321 Procedure and treatment not carried out due to patient leaving prior to being seen by health care provider: Secondary | ICD-10-CM

## 2024-03-01 NOTE — Progress Notes (Signed)
 Patient arrived at 1304 for a 1400 appt.   Provider tried to log in early with patient around 1329, but patient had already logged off. Provider sent a link then, but patient did not log back on.   Provider sent a link again at 1359 and 1405 to email and phone and patient made no attempt to log back on.   Provider awaited until 1423 for patient and they never logged back in or made any further attempts to.   Will mark left without being seen and NO CHARGE.

## 2024-03-13 ENCOUNTER — Other Ambulatory Visit: Payer: Self-pay

## 2024-03-15 ENCOUNTER — Telehealth: Payer: Self-pay

## 2024-03-15 MED ORDER — BENZONATATE 100 MG PO CAPS: 100.0000 mg | ORAL_CAPSULE | Freq: Three times a day (TID) | ORAL | 0 refills | Status: DC | PRN

## 2024-03-15 NOTE — Telephone Encounter (Signed)
 Copied from CRM [id number]. Topic: Clinical - Medication Refill >> Mar 15, 2024  1:37 PM Joesph PARAS wrote: Medication:  benzonatate  (TESSALON ) 100 MG capsule   Has the patient contacted their pharmacy? Yes (Agent: If no, request that the patient contact the pharmacy for the refill. If patient does not wish to contact the pharmacy document the reason why and proceed with request.) (Agent: If yes, when and what did the pharmacy advise?)  This is the patient's preferred pharmacy:  [id number] - [place], [place] - [address] [place] [address] [place] [address] [place] [address] Phone: [phone] Fax: [phone]   Is this the correct pharmacy for this prescription? Yes If no, delete pharmacy and type the correct one.   Has the prescription been filled recently? No  Is the patient out of the medication? Yes  Has the patient been seen for an appointment in the last year OR does the patient have an upcoming appointment? Yes  Can we respond through MyChart? Yes  Agent: Please be advised that Rx refills may take up to 3 business days. We ask that you follow-up with your pharmacy.      Per chart note 12/26/2023 from Almarie Ferrari, NP  Prescribed Tessalon  Perles for cough relief.   Will okay refill on med.Tessalon  Pearles 100 mg.

## 2024-03-18 ENCOUNTER — Ambulatory Visit: Admitting: Surgical

## 2024-03-19 ENCOUNTER — Ambulatory Visit

## 2024-03-19 VITALS — BP 126/66 | HR 75 | Temp 97.6°F | Ht 64.0 in | Wt 125.0 lb

## 2024-03-19 DIAGNOSIS — J449 Chronic obstructive pulmonary disease, unspecified: Secondary | ICD-10-CM

## 2024-03-19 DIAGNOSIS — F17210 Nicotine dependence, cigarettes, uncomplicated: Secondary | ICD-10-CM | POA: Diagnosis not present

## 2024-03-19 DIAGNOSIS — R0902 Hypoxemia: Secondary | ICD-10-CM | POA: Diagnosis not present

## 2024-03-19 DIAGNOSIS — J4489 Other specified chronic obstructive pulmonary disease: Secondary | ICD-10-CM

## 2024-03-19 MED ORDER — PREDNISONE 10 MG PO TABS: 40.0000 mg | ORAL_TABLET | Freq: Every day | ORAL | 0 refills | Status: AC

## 2024-03-19 MED ORDER — BENZONATATE 100 MG PO CAPS: 100.0000 mg | ORAL_CAPSULE | Freq: Three times a day (TID) | ORAL | 1 refills | Status: AC | PRN

## 2024-03-19 MED ORDER — ALBUTEROL SULFATE (2.5 MG/3ML) 0.083% IN NEBU: 2.5000 mg | INHALATION_SOLUTION | Freq: Four times a day (QID) | RESPIRATORY_TRACT | 4 refills | Status: AC | PRN

## 2024-03-19 NOTE — Patient Instructions (Addendum)
 Dear Erica Richardson;  - Continue symbicort  and spiriva  - Continue Ohtuvayre  nebulizer - I will send you prednisone  40 mg daily for 5 days, only for rescue therapy if you are having flare as severe shortness of breath and wheezing.  - I highly recommend you to exercise every day. I sent a pulmonary rehab referral to help you with that. -For mucus or chest congestion: I recommend to use the flutter device and the albuterol  nebulizer as need to keep your airways clean. Exercise will help with this as well.  -I am glad to hear you have reduce your smoking activity. Please reschedule the smoking cessation clinic appointment.   I will see in 3 months.
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
## 2024-03-25 ENCOUNTER — Ambulatory Visit: Admitting: Surgical

## 2024-03-28 ENCOUNTER — Other Ambulatory Visit: Payer: Self-pay

## 2024-03-28 ENCOUNTER — Ambulatory Visit: Admitting: Surgical

## 2024-03-28 DIAGNOSIS — M19011 Primary osteoarthritis, right shoulder: Secondary | ICD-10-CM

## 2024-03-28 DIAGNOSIS — M19012 Primary osteoarthritis, left shoulder: Secondary | ICD-10-CM

## 2024-03-28 DIAGNOSIS — M542 Cervicalgia: Secondary | ICD-10-CM

## 2024-04-11 ENCOUNTER — Ambulatory Visit: Admitting: Primary Care

## 2024-06-17 ENCOUNTER — Ambulatory Visit
